# Patient Record
Sex: Female | Born: 1940 | Race: White | Hispanic: No | Marital: Married | Smoking: Never smoker
Health system: Southern US, Community
[De-identification: ages and names within clinical notes are randomized; demographics above are authoritative.]

## PROBLEM LIST (undated history)

## (undated) DIAGNOSIS — N3020 Other chronic cystitis without hematuria: Secondary | ICD-10-CM

## (undated) DIAGNOSIS — D649 Anemia, unspecified: Secondary | ICD-10-CM

## (undated) DIAGNOSIS — K219 Gastro-esophageal reflux disease without esophagitis: Secondary | ICD-10-CM

## (undated) DIAGNOSIS — I509 Heart failure, unspecified: Secondary | ICD-10-CM

## (undated) DIAGNOSIS — J449 Chronic obstructive pulmonary disease, unspecified: Secondary | ICD-10-CM

## (undated) DIAGNOSIS — I10 Essential (primary) hypertension: Secondary | ICD-10-CM

## (undated) DIAGNOSIS — M1990 Unspecified osteoarthritis, unspecified site: Secondary | ICD-10-CM

## (undated) DIAGNOSIS — C801 Malignant (primary) neoplasm, unspecified: Secondary | ICD-10-CM

## (undated) DIAGNOSIS — G4730 Sleep apnea, unspecified: Secondary | ICD-10-CM

## (undated) DIAGNOSIS — Z5189 Encounter for other specified aftercare: Secondary | ICD-10-CM

## (undated) DIAGNOSIS — E785 Hyperlipidemia, unspecified: Secondary | ICD-10-CM

## (undated) DIAGNOSIS — E079 Disorder of thyroid, unspecified: Secondary | ICD-10-CM

## (undated) DIAGNOSIS — IMO0001 Reserved for inherently not codable concepts without codable children: Secondary | ICD-10-CM

## (undated) DIAGNOSIS — I059 Rheumatic mitral valve disease, unspecified: Secondary | ICD-10-CM

## (undated) DIAGNOSIS — I209 Angina pectoris, unspecified: Secondary | ICD-10-CM

## (undated) HISTORY — DX: Hyperlipidemia, unspecified: E78.5

## (undated) HISTORY — DX: Anemia, unspecified: D64.9

## (undated) HISTORY — PX: HERNIA REPAIR: SHX51

## (undated) HISTORY — PX: BLADDER SURGERY: SHX569

## (undated) HISTORY — PX: APPENDECTOMY: SHX54

## (undated) HISTORY — PX: BACK SURGERY: SHX140

## (undated) HISTORY — PX: COLON SURGERY: SHX602

## (undated) HISTORY — DX: Malignant (primary) neoplasm, unspecified: C80.1

## (undated) HISTORY — DX: Reserved for inherently not codable concepts without codable children: IMO0001

## (undated) HISTORY — PX: ABDOMINAL HYSTERECTOMY: SHX81

## (undated) HISTORY — PX: MASTECTOMY: SHX3

## (undated) HISTORY — DX: Encounter for other specified aftercare: Z51.89

## (undated) HISTORY — DX: Unspecified osteoarthritis, unspecified site: M19.90

## (undated) HISTORY — PX: PARATHYROIDECTOMY: SHX19

## (undated) HISTORY — PX: OOPHORECTOMY: SHX86

## (undated) HISTORY — PX: KNEE SURGERY: SHX244

## (undated) HISTORY — DX: Essential (primary) hypertension: I10

## (undated) HISTORY — PX: KIDNEY STONE SURGERY: SHX686

## (undated) HISTORY — PX: FOOT SURGERY: SHX648

---

## 1999-10-25 ENCOUNTER — Ambulatory Visit (HOSPITAL_COMMUNITY): Admission: RE | Admit: 1999-10-25 | Discharge: 1999-10-25 | Payer: Self-pay | Admitting: Neurosurgery

## 1999-10-25 ENCOUNTER — Encounter: Payer: Self-pay | Admitting: Neurosurgery

## 1999-11-01 ENCOUNTER — Ambulatory Visit: Admission: RE | Admit: 1999-11-01 | Discharge: 1999-11-01 | Payer: Self-pay | Admitting: Anesthesiology

## 2000-06-22 ENCOUNTER — Ambulatory Visit (HOSPITAL_COMMUNITY): Admission: RE | Admit: 2000-06-22 | Discharge: 2000-06-22 | Payer: Self-pay | Admitting: Internal Medicine

## 2000-06-22 ENCOUNTER — Encounter: Payer: Self-pay | Admitting: Internal Medicine

## 2003-06-22 ENCOUNTER — Other Ambulatory Visit: Payer: Self-pay

## 2004-03-09 ENCOUNTER — Other Ambulatory Visit: Payer: Self-pay

## 2004-03-10 ENCOUNTER — Inpatient Hospital Stay: Payer: Self-pay | Admitting: Anesthesiology

## 2004-03-10 ENCOUNTER — Other Ambulatory Visit: Payer: Self-pay

## 2004-05-15 ENCOUNTER — Ambulatory Visit: Payer: Self-pay | Admitting: Cardiology

## 2004-05-30 ENCOUNTER — Ambulatory Visit: Payer: Self-pay | Admitting: Nurse Practitioner

## 2004-06-20 ENCOUNTER — Ambulatory Visit: Payer: Self-pay | Admitting: Internal Medicine

## 2004-06-27 ENCOUNTER — Ambulatory Visit: Payer: Self-pay

## 2004-10-07 ENCOUNTER — Ambulatory Visit: Payer: Self-pay

## 2004-10-24 ENCOUNTER — Ambulatory Visit: Payer: Self-pay | Admitting: Internal Medicine

## 2005-01-21 ENCOUNTER — Emergency Department: Payer: Self-pay | Admitting: Unknown Physician Specialty

## 2005-01-21 ENCOUNTER — Other Ambulatory Visit: Payer: Self-pay

## 2005-05-04 ENCOUNTER — Emergency Department: Payer: Self-pay | Admitting: Emergency Medicine

## 2005-05-09 ENCOUNTER — Inpatient Hospital Stay: Payer: Self-pay | Admitting: Urology

## 2005-07-21 ENCOUNTER — Ambulatory Visit: Payer: Self-pay | Admitting: Urology

## 2005-08-11 ENCOUNTER — Ambulatory Visit: Payer: Self-pay | Admitting: Pain Medicine

## 2005-08-19 ENCOUNTER — Ambulatory Visit: Payer: Self-pay | Admitting: Pain Medicine

## 2005-08-21 ENCOUNTER — Ambulatory Visit: Payer: Self-pay | Admitting: Pain Medicine

## 2005-09-02 ENCOUNTER — Ambulatory Visit: Payer: Self-pay | Admitting: Physician Assistant

## 2005-09-10 ENCOUNTER — Ambulatory Visit: Payer: Self-pay | Admitting: Physician Assistant

## 2005-09-18 ENCOUNTER — Ambulatory Visit: Payer: Self-pay | Admitting: Pain Medicine

## 2005-09-23 ENCOUNTER — Other Ambulatory Visit: Payer: Self-pay

## 2005-09-23 ENCOUNTER — Inpatient Hospital Stay: Payer: Self-pay | Admitting: Internal Medicine

## 2005-10-07 ENCOUNTER — Ambulatory Visit: Payer: Self-pay | Admitting: Pain Medicine

## 2005-10-21 ENCOUNTER — Ambulatory Visit: Payer: Self-pay | Admitting: Physician Assistant

## 2005-10-31 ENCOUNTER — Inpatient Hospital Stay: Payer: Self-pay | Admitting: Internal Medicine

## 2005-10-31 ENCOUNTER — Other Ambulatory Visit: Payer: Self-pay

## 2005-11-06 ENCOUNTER — Ambulatory Visit: Payer: Self-pay | Admitting: Physician Assistant

## 2005-11-13 ENCOUNTER — Ambulatory Visit: Payer: Self-pay | Admitting: Physician Assistant

## 2005-11-18 ENCOUNTER — Ambulatory Visit: Payer: Self-pay | Admitting: Gastroenterology

## 2005-11-26 ENCOUNTER — Ambulatory Visit: Payer: Self-pay | Admitting: Gastroenterology

## 2005-12-10 ENCOUNTER — Ambulatory Visit: Payer: Self-pay | Admitting: Physician Assistant

## 2006-01-01 ENCOUNTER — Ambulatory Visit: Payer: Self-pay | Admitting: Surgery

## 2006-01-23 ENCOUNTER — Other Ambulatory Visit: Payer: Self-pay

## 2006-01-23 ENCOUNTER — Inpatient Hospital Stay: Payer: Self-pay | Admitting: Internal Medicine

## 2006-02-04 ENCOUNTER — Ambulatory Visit: Payer: Self-pay | Admitting: Internal Medicine

## 2006-02-16 ENCOUNTER — Ambulatory Visit: Payer: Self-pay | Admitting: Urology

## 2006-02-27 ENCOUNTER — Ambulatory Visit: Payer: Self-pay | Admitting: Internal Medicine

## 2006-03-10 ENCOUNTER — Ambulatory Visit: Payer: Self-pay | Admitting: Internal Medicine

## 2006-08-05 ENCOUNTER — Ambulatory Visit: Payer: Self-pay | Admitting: Internal Medicine

## 2006-08-26 ENCOUNTER — Ambulatory Visit: Payer: Self-pay | Admitting: Urology

## 2006-08-27 ENCOUNTER — Ambulatory Visit: Payer: Self-pay | Admitting: Surgery

## 2006-09-07 ENCOUNTER — Ambulatory Visit: Payer: Self-pay | Admitting: Surgery

## 2006-10-02 ENCOUNTER — Ambulatory Visit: Payer: Self-pay | Admitting: Gastroenterology

## 2007-02-11 ENCOUNTER — Ambulatory Visit: Payer: Self-pay | Admitting: Family Medicine

## 2007-02-17 ENCOUNTER — Ambulatory Visit: Payer: Self-pay | Admitting: Internal Medicine

## 2007-02-24 ENCOUNTER — Ambulatory Visit: Payer: Self-pay | Admitting: Urology

## 2007-03-22 ENCOUNTER — Ambulatory Visit: Payer: Self-pay | Admitting: Pulmonary Disease

## 2007-03-27 DIAGNOSIS — IMO0002 Reserved for concepts with insufficient information to code with codable children: Secondary | ICD-10-CM | POA: Insufficient documentation

## 2007-03-27 DIAGNOSIS — J209 Acute bronchitis, unspecified: Secondary | ICD-10-CM | POA: Insufficient documentation

## 2007-03-27 DIAGNOSIS — J309 Allergic rhinitis, unspecified: Secondary | ICD-10-CM | POA: Insufficient documentation

## 2007-03-27 DIAGNOSIS — K219 Gastro-esophageal reflux disease without esophagitis: Secondary | ICD-10-CM

## 2007-04-09 ENCOUNTER — Ambulatory Visit: Payer: Self-pay | Admitting: Pulmonary Disease

## 2007-04-09 ENCOUNTER — Ambulatory Visit: Payer: Self-pay | Admitting: Internal Medicine

## 2007-04-09 LAB — CONVERTED CEMR LAB
Chloride: 106 meq/L (ref 96–112)
Eosinophils Absolute: 0.2 10*3/uL (ref 0.0–0.6)
Eosinophils Relative: 3.1 % (ref 0.0–5.0)
GFR calc Af Amer: 64 mL/min
GFR calc non Af Amer: 53 mL/min
Glucose, Bld: 98 mg/dL (ref 70–99)
HCT: 40 % (ref 36.0–46.0)
Hemoglobin: 14.1 g/dL (ref 12.0–15.0)
Lymphocytes Relative: 30.1 % (ref 12.0–46.0)
MCV: 92.3 fL (ref 78.0–100.0)
Neutro Abs: 3 10*3/uL (ref 1.4–7.7)
Neutrophils Relative %: 51.3 % (ref 43.0–77.0)
Sodium: 140 meq/L (ref 135–145)
WBC: 6 10*3/uL (ref 4.5–10.5)

## 2007-04-16 ENCOUNTER — Ambulatory Visit: Payer: Self-pay | Admitting: Internal Medicine

## 2007-04-22 ENCOUNTER — Ambulatory Visit: Payer: Self-pay | Admitting: Internal Medicine

## 2007-04-23 ENCOUNTER — Ambulatory Visit: Payer: Self-pay | Admitting: Internal Medicine

## 2007-04-28 ENCOUNTER — Ambulatory Visit: Payer: Self-pay | Admitting: Internal Medicine

## 2007-05-17 ENCOUNTER — Ambulatory Visit: Payer: Self-pay | Admitting: Internal Medicine

## 2007-07-06 ENCOUNTER — Ambulatory Visit: Payer: Self-pay | Admitting: Family

## 2007-07-07 ENCOUNTER — Encounter: Payer: Self-pay | Admitting: Internal Medicine

## 2007-09-08 ENCOUNTER — Encounter (INDEPENDENT_AMBULATORY_CARE_PROVIDER_SITE_OTHER): Payer: Self-pay | Admitting: Diagnostic Radiology

## 2007-09-08 ENCOUNTER — Encounter: Admission: RE | Admit: 2007-09-08 | Discharge: 2007-09-08 | Payer: Self-pay | Admitting: Surgery

## 2007-09-15 ENCOUNTER — Ambulatory Visit: Payer: Self-pay | Admitting: Gastroenterology

## 2007-09-17 ENCOUNTER — Encounter: Admission: RE | Admit: 2007-09-17 | Discharge: 2007-09-17 | Payer: Self-pay | Admitting: Surgery

## 2007-09-29 ENCOUNTER — Ambulatory Visit: Payer: Self-pay | Admitting: Gastroenterology

## 2007-10-22 ENCOUNTER — Inpatient Hospital Stay (HOSPITAL_COMMUNITY): Admission: RE | Admit: 2007-10-22 | Discharge: 2007-10-24 | Payer: Self-pay | Admitting: Surgery

## 2007-10-22 ENCOUNTER — Encounter (INDEPENDENT_AMBULATORY_CARE_PROVIDER_SITE_OTHER): Payer: Self-pay | Admitting: Surgery

## 2007-11-04 ENCOUNTER — Ambulatory Visit: Payer: Self-pay | Admitting: Oncology

## 2007-11-16 LAB — COMPREHENSIVE METABOLIC PANEL
Albumin: 3.6 g/dL (ref 3.5–5.2)
BUN: 12 mg/dL (ref 6–23)
Calcium: 9.4 mg/dL (ref 8.4–10.5)
Chloride: 106 mEq/L (ref 96–112)
Glucose, Bld: 140 mg/dL — ABNORMAL HIGH (ref 70–99)
Potassium: 4.2 mEq/L (ref 3.5–5.3)

## 2007-11-16 LAB — CBC WITH DIFFERENTIAL (CANCER CENTER ONLY)
BASO%: 0.8 % (ref 0.0–2.0)
LYMPH%: 31.1 % (ref 14.0–48.0)
MCH: 24.2 pg — ABNORMAL LOW (ref 26.0–34.0)
MCHC: 32.9 g/dL (ref 32.0–36.0)
MCV: 74 fL — ABNORMAL LOW (ref 81–101)
MONO%: 7.2 % (ref 0.0–13.0)
NEUT%: 55.9 % (ref 39.6–80.0)
Platelets: 293 10*3/uL (ref 145–400)
RDW: 17.5 % — ABNORMAL HIGH (ref 10.5–14.6)

## 2007-11-24 ENCOUNTER — Ambulatory Visit: Payer: Self-pay | Admitting: Urology

## 2007-12-14 ENCOUNTER — Encounter: Admission: RE | Admit: 2007-12-14 | Discharge: 2007-12-14 | Payer: Self-pay | Admitting: Oncology

## 2007-12-21 ENCOUNTER — Ambulatory Visit: Payer: Self-pay | Admitting: Oncology

## 2007-12-23 LAB — CBC WITH DIFFERENTIAL (CANCER CENTER ONLY)
BASO%: 0.6 % (ref 0.0–2.0)
EOS%: 4.3 % (ref 0.0–7.0)
LYMPH#: 1.8 10*3/uL (ref 0.9–3.3)
LYMPH%: 28.4 % (ref 14.0–48.0)
MCHC: 32.8 g/dL (ref 32.0–36.0)
MONO#: 0.5 10*3/uL (ref 0.1–0.9)
Platelets: 315 10*3/uL (ref 145–400)
RDW: 22.9 % — ABNORMAL HIGH (ref 10.5–14.6)
WBC: 6.2 10*3/uL (ref 3.9–10.0)

## 2007-12-23 LAB — CMP (CANCER CENTER ONLY)
ALT(SGPT): 151 U/L — ABNORMAL HIGH (ref 10–47)
AST: 226 U/L — ABNORMAL HIGH (ref 11–38)
Albumin: 3.6 g/dL (ref 3.3–5.5)
BUN, Bld: 18 mg/dL (ref 7–22)
Calcium: 9.6 mg/dL (ref 8.0–10.3)
Chloride: 101 mEq/L (ref 98–108)
Potassium: 4.8 mEq/L — ABNORMAL HIGH (ref 3.3–4.7)
Total Protein: 7.8 g/dL (ref 6.4–8.1)

## 2007-12-24 LAB — IRON AND TIBC: UIBC: 331 ug/dL

## 2008-01-10 LAB — HEPATIC FUNCTION PANEL
Albumin: 3.7 g/dL (ref 3.5–5.2)
Alkaline Phosphatase: 136 U/L — ABNORMAL HIGH (ref 39–117)
Total Bilirubin: 0.8 mg/dL (ref 0.3–1.2)

## 2008-03-14 ENCOUNTER — Ambulatory Visit: Payer: Self-pay | Admitting: Oncology

## 2008-03-16 LAB — CBC WITH DIFFERENTIAL (CANCER CENTER ONLY)
BASO#: 0.1 10*3/uL (ref 0.0–0.2)
Eosinophils Absolute: 0.2 10*3/uL (ref 0.0–0.5)
HCT: 39.8 % (ref 34.8–46.6)
LYMPH%: 38.9 % (ref 14.0–48.0)
MCH: 31.5 pg (ref 26.0–34.0)
MCV: 92 fL (ref 81–101)
MONO%: 7.6 % (ref 0.0–13.0)
NEUT%: 47.6 % (ref 39.6–80.0)
Platelets: 217 10*3/uL (ref 145–400)
RBC: 4.3 10*6/uL (ref 3.70–5.32)

## 2008-03-16 LAB — GAMMA GT: GGT: 145 U/L — ABNORMAL HIGH (ref 7–51)

## 2008-03-16 LAB — CMP (CANCER CENTER ONLY)
CO2: 28 mEq/L (ref 18–33)
Creat: 0.8 mg/dl (ref 0.6–1.2)
Glucose, Bld: 121 mg/dL — ABNORMAL HIGH (ref 73–118)
Sodium: 142 mEq/L (ref 128–145)
Total Bilirubin: 0.8 mg/dl (ref 0.20–1.60)
Total Protein: 7.6 g/dL (ref 6.4–8.1)

## 2008-03-23 ENCOUNTER — Inpatient Hospital Stay: Payer: Self-pay | Admitting: Internal Medicine

## 2008-06-21 ENCOUNTER — Ambulatory Visit: Payer: Self-pay | Admitting: Oncology

## 2008-06-22 LAB — CBC WITH DIFFERENTIAL/PLATELET
Basophils Absolute: 0.1 10*3/uL (ref 0.0–0.1)
EOS%: 6.4 % (ref 0.0–7.0)
HCT: 40.5 % (ref 34.8–46.6)
HGB: 14 g/dL (ref 11.6–15.9)
MCH: 31.3 pg (ref 26.0–34.0)
MCV: 90.4 fL (ref 81.0–101.0)
MONO%: 5.9 % (ref 0.0–13.0)
NEUT%: 49.2 % (ref 39.6–76.8)

## 2008-06-22 LAB — CMP (CANCER CENTER ONLY)
ALT(SGPT): 26 U/L (ref 10–47)
AST: 43 U/L — ABNORMAL HIGH (ref 11–38)
BUN, Bld: 16 mg/dL (ref 7–22)
Calcium: 9.4 mg/dL (ref 8.0–10.3)
Chloride: 102 mEq/L (ref 98–108)
Creat: 1.2 mg/dl (ref 0.6–1.2)
Total Bilirubin: 0.5 mg/dl (ref 0.20–1.60)

## 2008-07-17 ENCOUNTER — Ambulatory Visit: Payer: Self-pay | Admitting: Family

## 2008-09-11 ENCOUNTER — Encounter: Admission: RE | Admit: 2008-09-11 | Discharge: 2008-09-11 | Payer: Self-pay | Admitting: Oncology

## 2008-09-13 ENCOUNTER — Ambulatory Visit: Payer: Self-pay | Admitting: Family Medicine

## 2008-10-02 ENCOUNTER — Ambulatory Visit: Payer: Self-pay | Admitting: Oncology

## 2008-10-04 LAB — CMP (CANCER CENTER ONLY)
ALT(SGPT): 24 U/L (ref 10–47)
AST: 30 U/L (ref 11–38)
Albumin: 3.6 g/dL (ref 3.3–5.5)
Alkaline Phosphatase: 81 U/L (ref 26–84)
Calcium: 9.5 mg/dL (ref 8.0–10.3)
Chloride: 105 mEq/L (ref 98–108)
Potassium: 4.1 mEq/L (ref 3.3–4.7)
Sodium: 143 mEq/L (ref 128–145)
Total Protein: 7.3 g/dL (ref 6.4–8.1)

## 2008-10-04 LAB — CBC WITH DIFFERENTIAL (CANCER CENTER ONLY)
BASO#: 0.1 10*3/uL (ref 0.0–0.2)
Eosinophils Absolute: 0.4 10*3/uL (ref 0.0–0.5)
HGB: 13.5 g/dL (ref 11.6–15.9)
MCH: 32.8 pg (ref 26.0–34.0)
MCV: 93 fL (ref 81–101)
MONO%: 8.4 % (ref 0.0–13.0)
NEUT#: 2.4 10*3/uL (ref 1.5–6.5)
RBC: 4.11 10*6/uL (ref 3.70–5.32)

## 2008-10-06 ENCOUNTER — Ambulatory Visit: Payer: Self-pay | Admitting: Family Medicine

## 2008-10-31 ENCOUNTER — Ambulatory Visit: Payer: Self-pay | Admitting: Surgery

## 2009-02-23 ENCOUNTER — Ambulatory Visit: Payer: Self-pay | Admitting: Urology

## 2009-02-27 ENCOUNTER — Ambulatory Visit: Payer: Self-pay | Admitting: Urology

## 2009-03-08 ENCOUNTER — Ambulatory Visit: Payer: Self-pay | Admitting: Specialist

## 2009-04-08 IMAGING — CR DG ABDOMEN 3V
1 series · 3 of 3 positions shown · non-contrast
Comparison: none

REASON FOR EXAM: Abdominal distention, vomiting
COMMENTS:

[Series 1: view not recorded · 0.17mm/px · 3 of 3 slices shown]
[im 1/3]
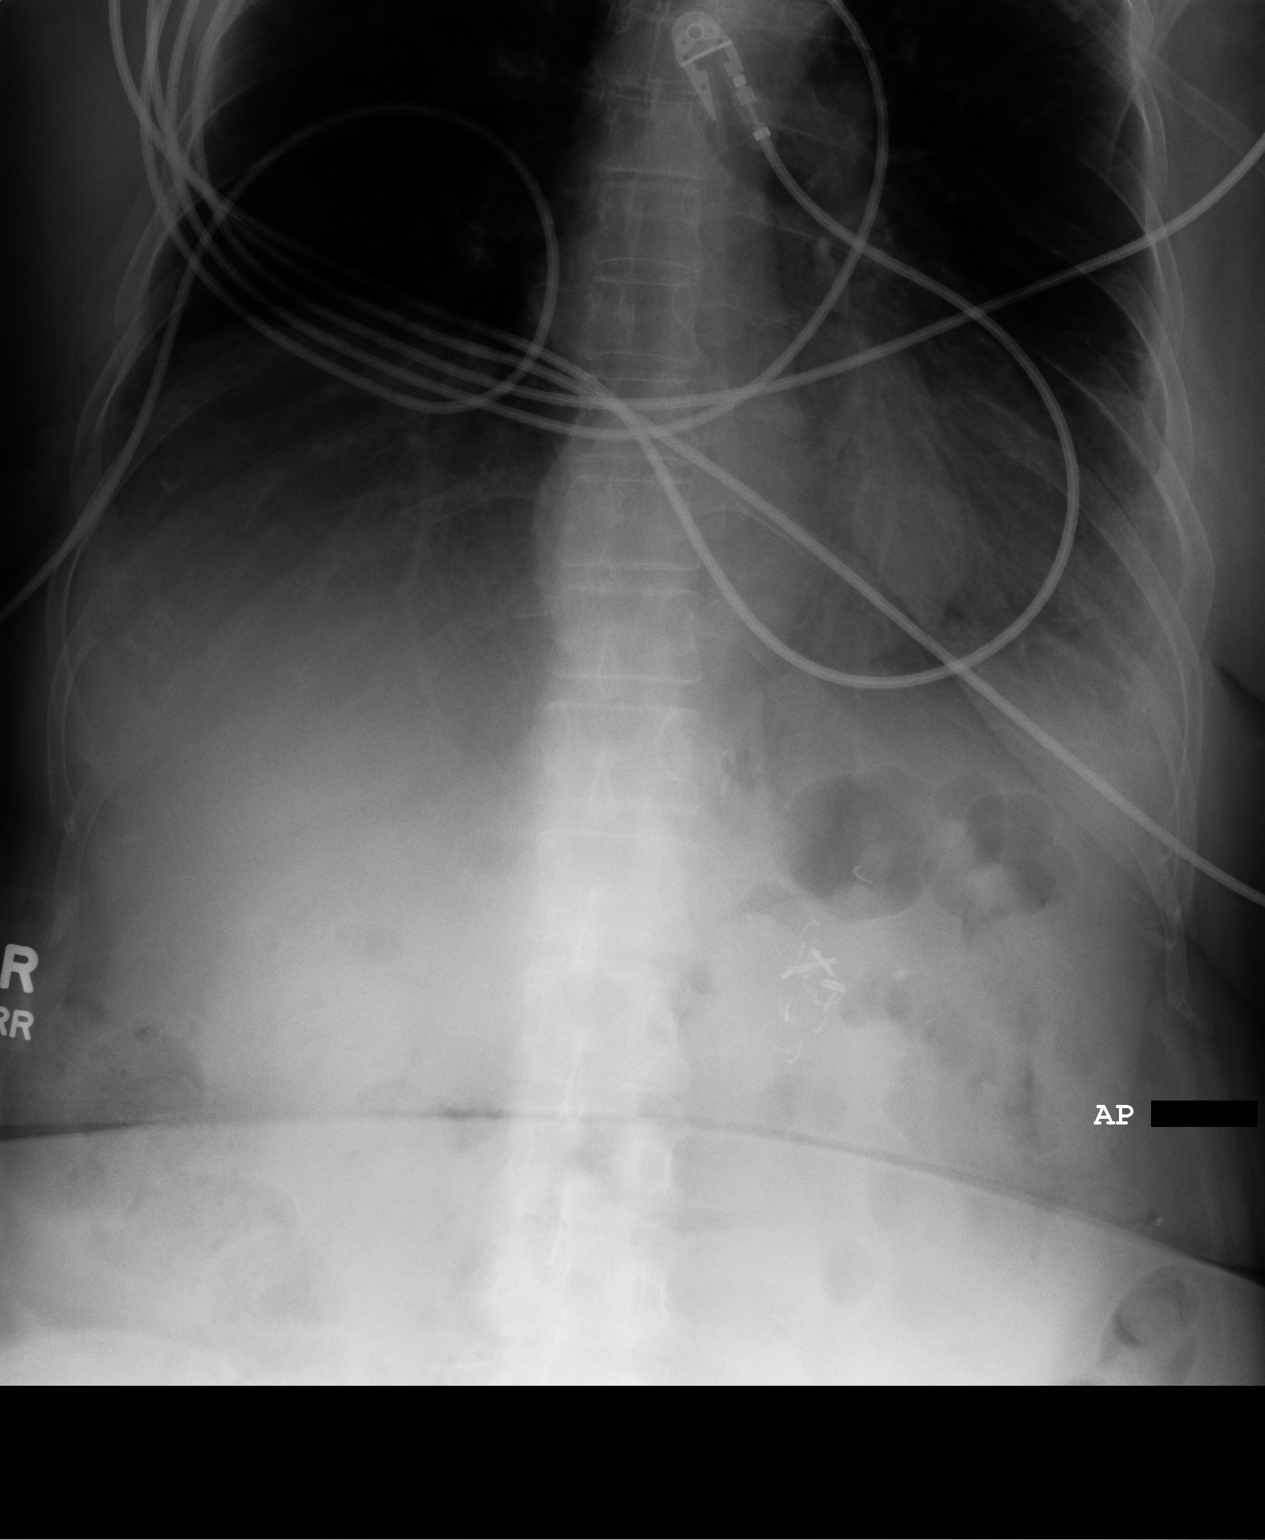
[im 2/3]
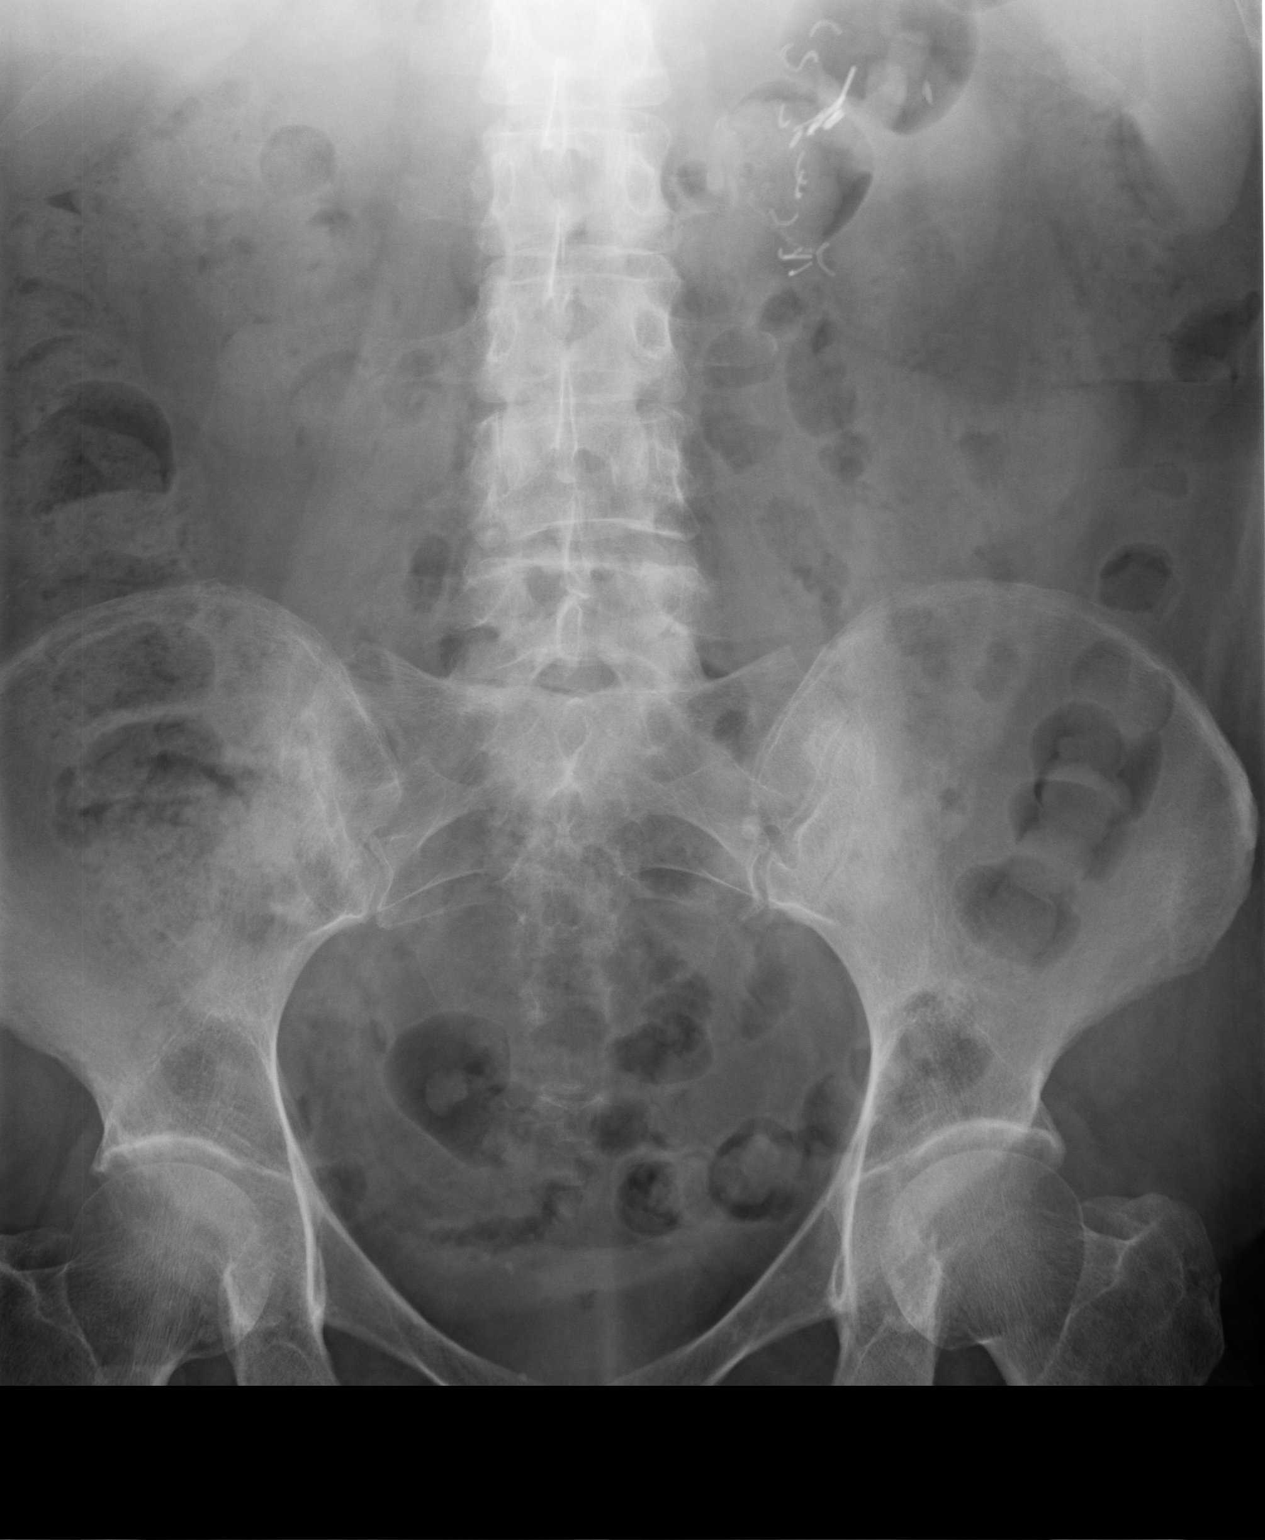
[im 3/3]
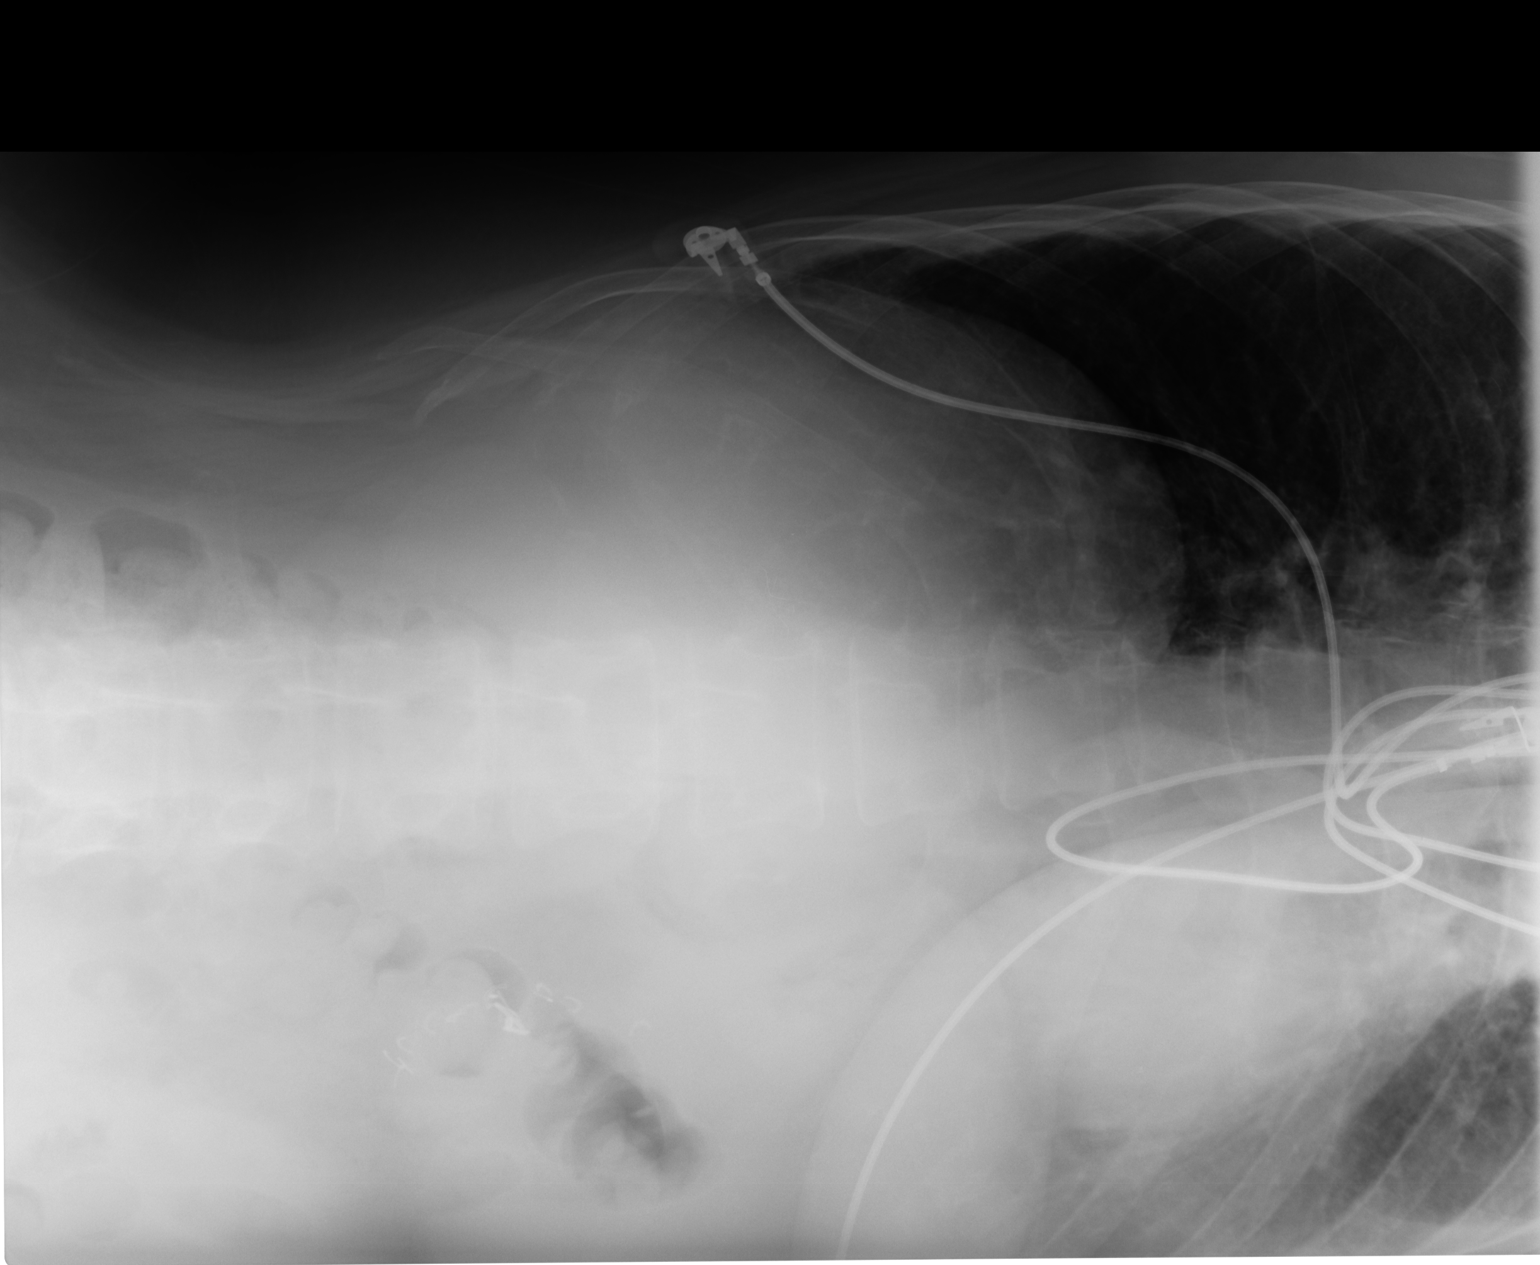

[3 of 3 positions shown; findings below may reference images not displayed]

PROCEDURE:     DXR - DXR ABDOMEN COMPLETE  - March 24, 2008  [DATE]

RESULT:     No subdiaphragmatic free air is seen. The bowel gas pattern
shows no specific abnormalities. No bowel obstruction is identified. No
definite air/fluid levels are seen. Gas is visualized in both the large and
small bowel. There is a moderate amount of fecal material in the ascending
and transverse colon. Post operative metallic clips are seen in the LEFT
upper quadrant. No abnormal intra-abdominal calcifications are noted. No
acute bony abnormalities are seen.
IMPRESSION: No acute changes are identified.

## 2009-04-09 IMAGING — US ABDOMEN ULTRASOUND
1 series · 17 of 25 positions shown · non-contrast
Comparison: none

REASON FOR EXAM: transaminases
COMMENTS:

[Series 1: abdomen ultrasound · 17 of 76 slices shown]
[im 1/76]
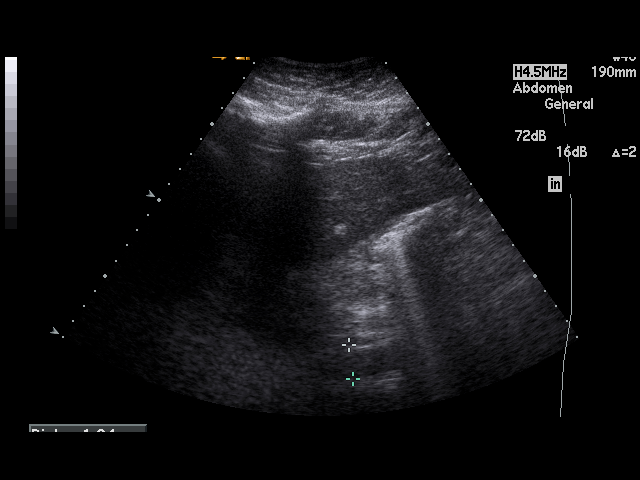
[im 7/76]
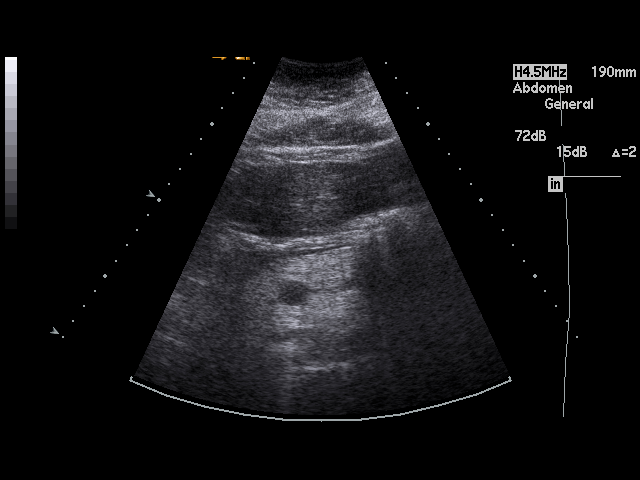
[im 10/76]
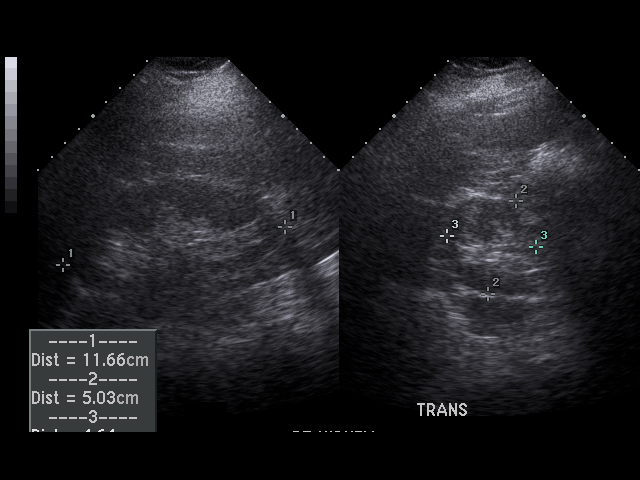
[im 16/76]
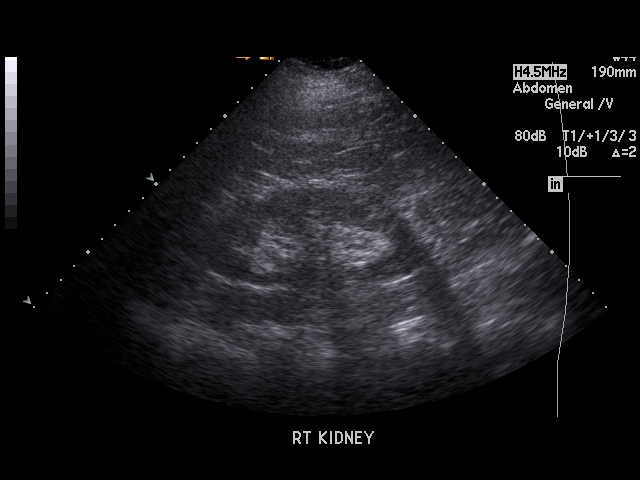
[im 19/76]
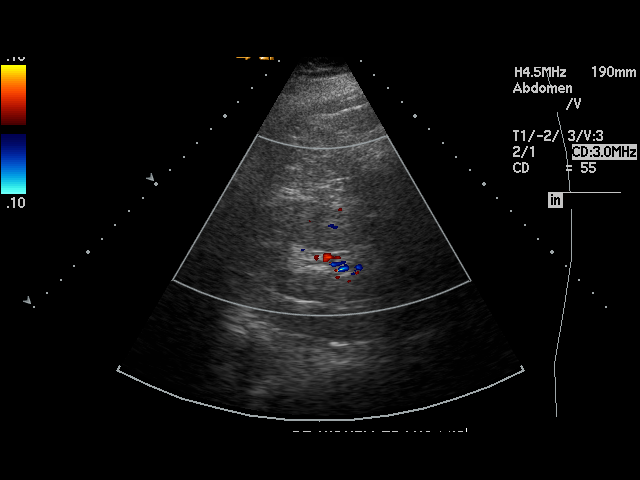
[im 26/76]
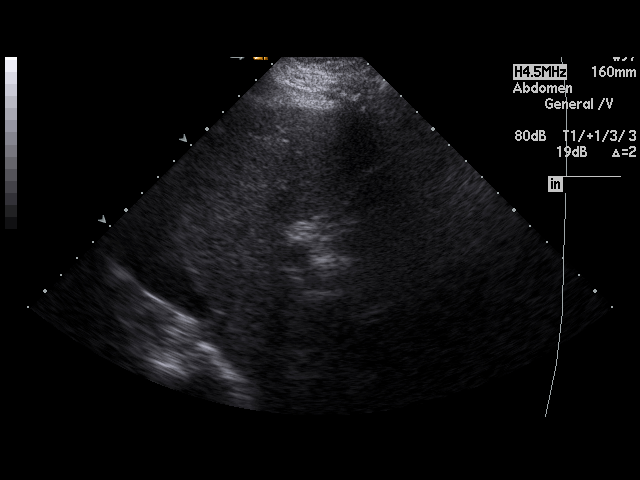
[im 29/76]
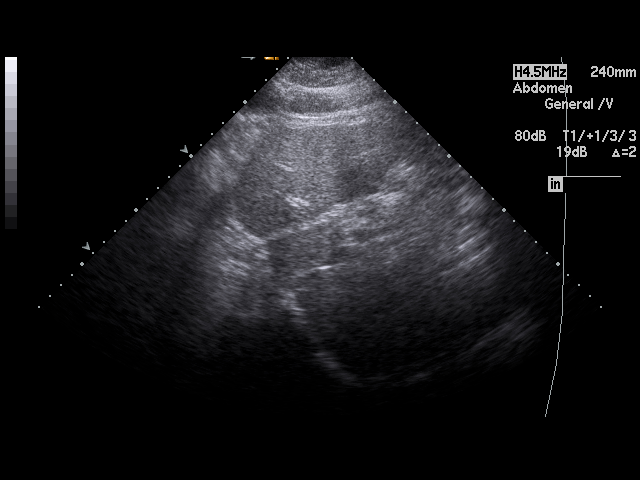
[im 35/76]
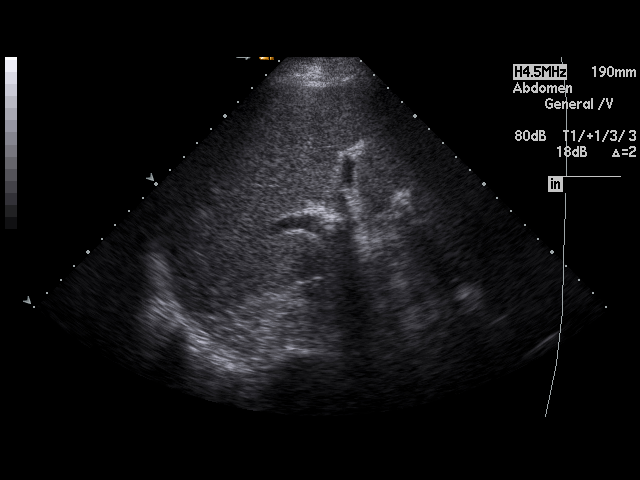
[im 38/76]
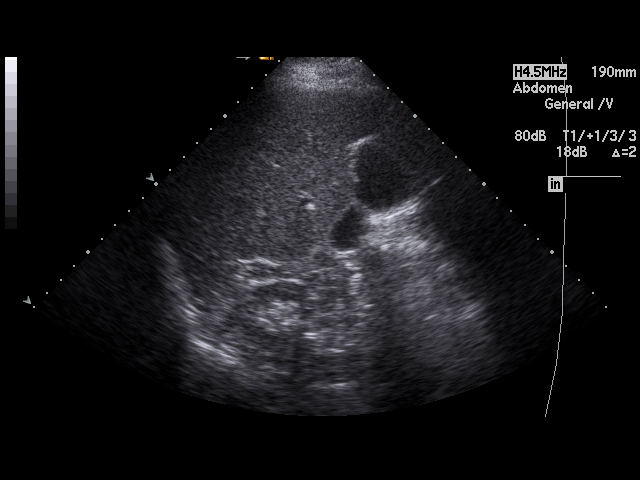
[im 41/76]
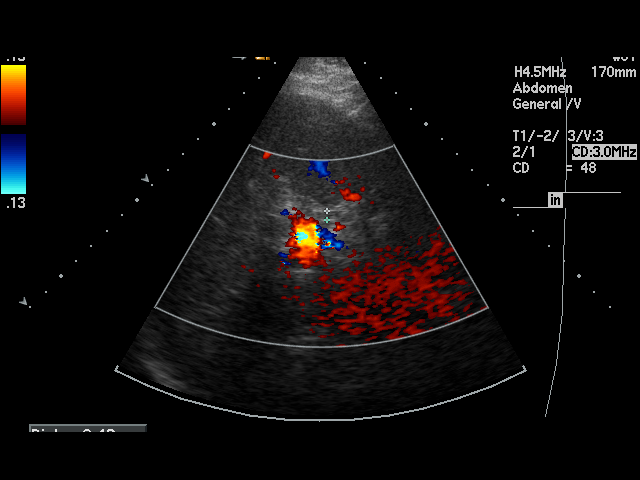
[im 47/76]
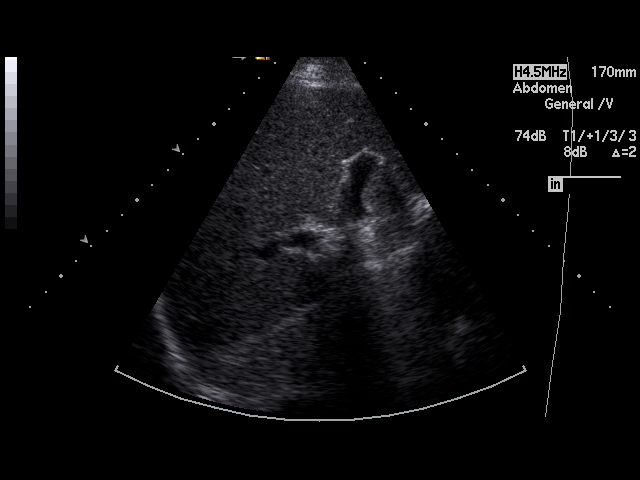
[im 51/76]
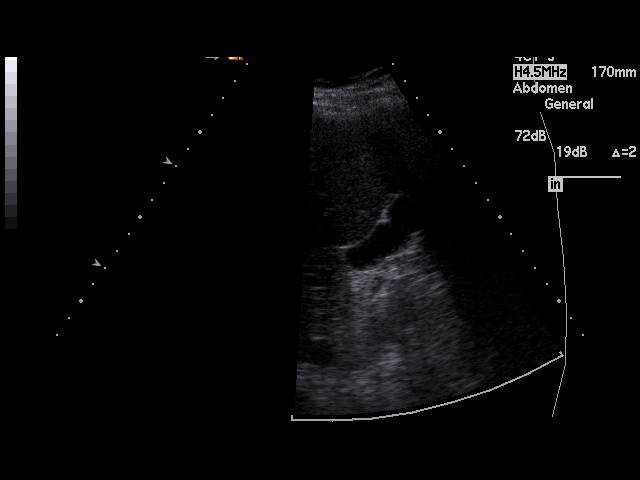
[im 57/76]
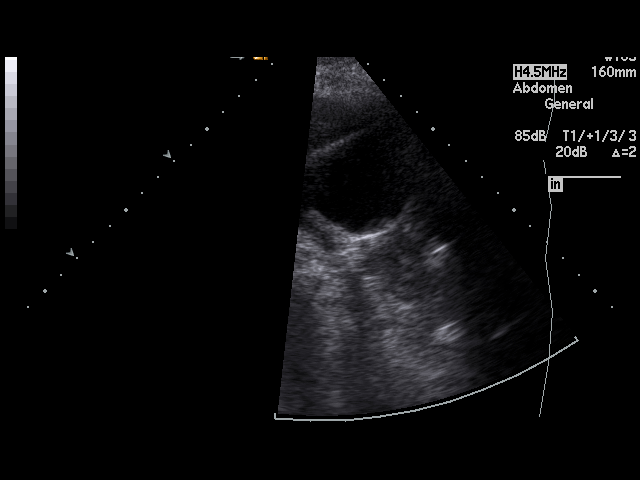
[im 60/76]
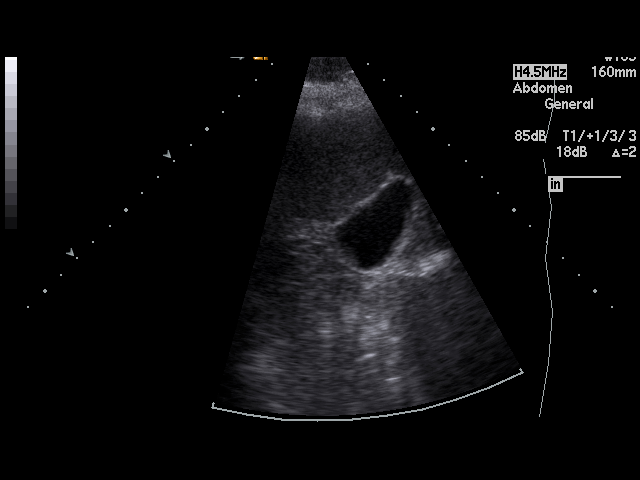
[im 66/76]
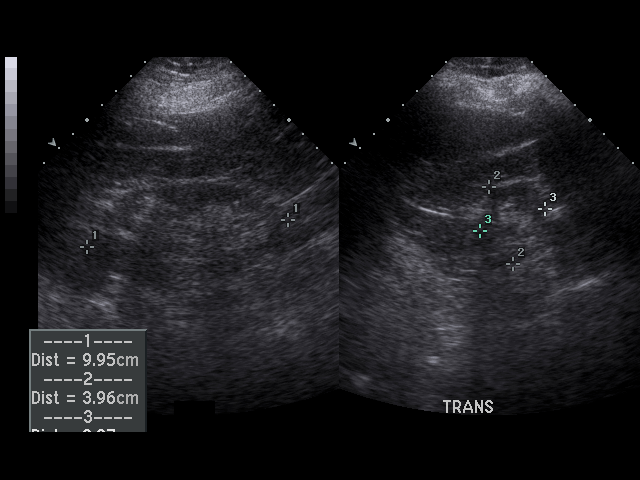
[im 69/76]
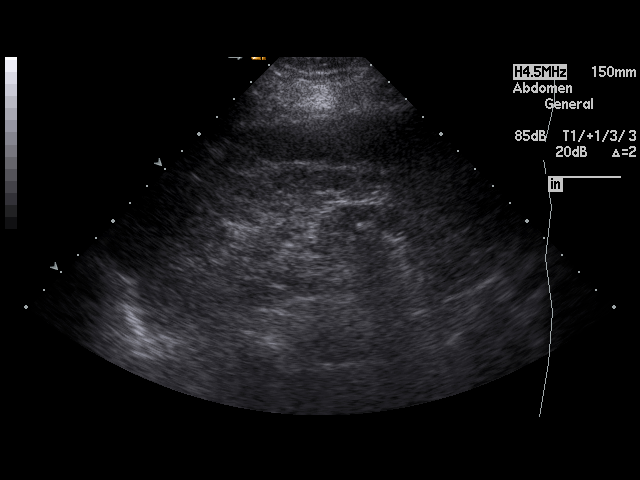
[im 76/76]
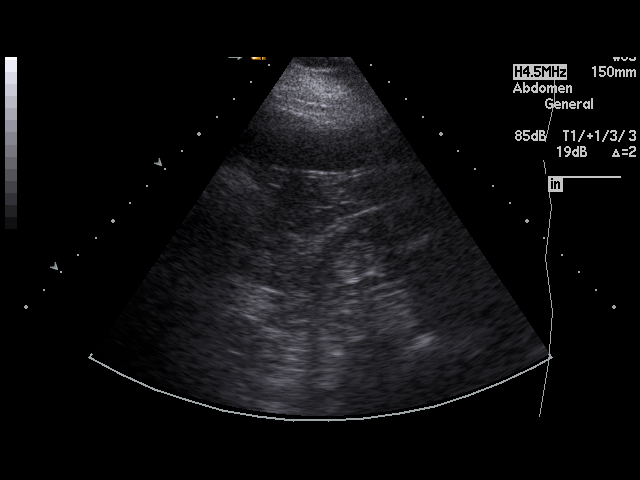

[17 of 25 positions shown; findings below may reference images not displayed]

PROCEDURE:     US  - US ABDOMEN GENERAL SURVEY  - March 25, 2008  [DATE]

RESULT:     Ultrasound of the abdomen is performed. The tail of the pancreas
is obscured by bowel gas. The aorta appears to be normal. The visualized
pancreas is unremarkable. The kidneys show a normal appearance on the RIGHT
with no mass or obstruction. The LEFT kidney demonstrates cortical thinning.
There are some echogenic areas in the LEFT kidney which may represent
stones. Vascular calcification cannot be completely excluded but is felt to
be somewhat less likely. No LEFT renal hydronephrosis is evident.

There is evidence of some sludge in the gallbladder. There is no sonographic
Murphy sign. The common bile duct diameter is normal at 4.2 mm. The spleen
is unremarkable.
IMPRESSION: 1. Sludge within the gallbladder. No biliary ductal dilation.
2. LEFT renal stones appear to be present. The LEFT renal cortex is thinned.
There is no urinary obstruction.

## 2009-04-17 ENCOUNTER — Ambulatory Visit: Payer: Self-pay | Admitting: Oncology

## 2009-04-18 LAB — CMP (CANCER CENTER ONLY)
Albumin: 3.8 g/dL (ref 3.3–5.5)
Alkaline Phosphatase: 84 U/L (ref 26–84)
BUN, Bld: 17 mg/dL (ref 7–22)
CO2: 24 mEq/L (ref 18–33)
Calcium: 9.8 mg/dL (ref 8.0–10.3)
Chloride: 107 mEq/L (ref 98–108)
Glucose, Bld: 82 mg/dL (ref 73–118)
Potassium: 4.5 mEq/L (ref 3.3–4.7)
Sodium: 142 mEq/L (ref 128–145)
Total Protein: 7.6 g/dL (ref 6.4–8.1)

## 2009-04-18 LAB — CBC WITH DIFFERENTIAL (CANCER CENTER ONLY)
BASO#: 0.1 10*3/uL (ref 0.0–0.2)
EOS%: 5.6 % (ref 0.0–7.0)
Eosinophils Absolute: 0.3 10*3/uL (ref 0.0–0.5)
HGB: 14 g/dL (ref 11.6–15.9)
LYMPH#: 2.3 10*3/uL (ref 0.9–3.3)
NEUT#: 2.7 10*3/uL (ref 1.5–6.5)
RBC: 4.27 10*6/uL (ref 3.70–5.32)
WBC: 5.9 10*3/uL (ref 3.9–10.0)

## 2009-06-18 ENCOUNTER — Ambulatory Visit (HOSPITAL_COMMUNITY): Admission: RE | Admit: 2009-06-18 | Discharge: 2009-06-18 | Payer: Self-pay | Admitting: Infectious Disease

## 2009-06-18 ENCOUNTER — Ambulatory Visit: Payer: Self-pay | Admitting: Infectious Disease

## 2009-06-18 ENCOUNTER — Encounter: Payer: Self-pay | Admitting: Infectious Disease

## 2009-06-18 DIAGNOSIS — J4489 Other specified chronic obstructive pulmonary disease: Secondary | ICD-10-CM | POA: Insufficient documentation

## 2009-06-18 DIAGNOSIS — R300 Dysuria: Secondary | ICD-10-CM | POA: Insufficient documentation

## 2009-06-18 DIAGNOSIS — N390 Urinary tract infection, site not specified: Secondary | ICD-10-CM | POA: Insufficient documentation

## 2009-06-18 DIAGNOSIS — J449 Chronic obstructive pulmonary disease, unspecified: Secondary | ICD-10-CM

## 2009-06-18 LAB — CONVERTED CEMR LAB
AST: 27 units/L (ref 0–37)
BUN: 16 mg/dL (ref 6–23)
CO2: 23 meq/L (ref 19–32)
Calcium: 9.9 mg/dL (ref 8.4–10.5)
Casts: NONE SEEN /lpf
Chloride: 105 meq/L (ref 96–112)
Creatinine, Ser: 0.89 mg/dL (ref 0.40–1.20)
Crystals: NONE SEEN
Eosinophils Absolute: 0.2 10*3/uL (ref 0.0–0.7)
Eosinophils Relative: 4 % (ref 0–5)
Glucose, Bld: 96 mg/dL (ref 70–99)
HCT: 49 % — ABNORMAL HIGH (ref 36.0–46.0)
Hemoglobin: 14.2 g/dL (ref 12.0–15.0)
Lymphs Abs: 2.3 10*3/uL (ref 0.7–4.0)
MCV: 109.9 fL — ABNORMAL HIGH (ref >=78.0)
Monocytes Absolute: 0.5 10*3/uL (ref 0.1–1.0)
Nitrite: NEGATIVE
Platelets: 220 10*3/uL (ref 150–400)
Specific Gravity, Urine: 1.019 (ref 1.005–1.0)
Urobilinogen, UA: 0.2 (ref 0.0–1.0)
WBC: 6 10*3/uL (ref 4.0–10.5)
pH: 6.5 (ref 5.0–8.0)

## 2009-06-20 ENCOUNTER — Encounter: Payer: Self-pay | Admitting: Infectious Disease

## 2009-06-25 ENCOUNTER — Encounter: Payer: Self-pay | Admitting: Infectious Disease

## 2009-07-02 ENCOUNTER — Encounter: Payer: Self-pay | Admitting: Infectious Disease

## 2009-07-05 ENCOUNTER — Telehealth: Payer: Self-pay | Admitting: Infectious Disease

## 2009-07-06 ENCOUNTER — Telehealth: Payer: Self-pay | Admitting: Infectious Disease

## 2009-07-11 ENCOUNTER — Encounter: Payer: Self-pay | Admitting: Infectious Disease

## 2009-07-18 ENCOUNTER — Ambulatory Visit: Payer: Self-pay | Admitting: Urology

## 2009-07-18 ENCOUNTER — Ambulatory Visit: Payer: Self-pay | Admitting: Family

## 2009-07-19 ENCOUNTER — Ambulatory Visit: Payer: Self-pay | Admitting: Infectious Disease

## 2009-07-19 DIAGNOSIS — Z1639 Resistance to other specified antimicrobial drug: Secondary | ICD-10-CM

## 2009-07-19 DIAGNOSIS — A498 Other bacterial infections of unspecified site: Secondary | ICD-10-CM | POA: Insufficient documentation

## 2009-07-19 DIAGNOSIS — M542 Cervicalgia: Secondary | ICD-10-CM | POA: Insufficient documentation

## 2009-07-19 LAB — CONVERTED CEMR LAB
BUN: 17 mg/dL (ref 6–23)
Basophils Absolute: 0.1 10*3/uL (ref 0.0–0.1)
Bilirubin Urine: NEGATIVE
CO2: 19 meq/L (ref 19–32)
CRP: 0.4 mg/dL (ref <0.6)
Eosinophils Absolute: 0.5 10*3/uL (ref 0.0–0.7)
Eosinophils Relative: 7 % — ABNORMAL HIGH (ref 0–5)
Glucose, Bld: 90 mg/dL (ref 70–99)
HCT: 42.6 % (ref 36.0–46.0)
Hemoglobin, Urine: NEGATIVE
Hemoglobin: 14.4 g/dL (ref 12.0–15.0)
Ketones, ur: NEGATIVE mg/dL
MCV: 93.4 fL (ref >=78.0)
Monocytes Absolute: 0.6 10*3/uL (ref 0.1–1.0)
Nitrite: NEGATIVE
Platelets: 260 10*3/uL (ref 150–400)
Potassium: 4.5 meq/L (ref 3.5–5.3)
RDW: 12.6 % (ref 11.5–15.5)
Sodium: 141 meq/L (ref 135–145)
Specific Gravity, Urine: 1.018 (ref 1.005–1.0)
pH: 6 (ref 5.0–8.0)

## 2009-07-24 ENCOUNTER — Observation Stay: Payer: Self-pay | Admitting: Internal Medicine

## 2009-07-27 ENCOUNTER — Encounter: Payer: Self-pay | Admitting: Infectious Disease

## 2009-08-01 ENCOUNTER — Telehealth: Payer: Self-pay | Admitting: Infectious Disease

## 2009-08-20 ENCOUNTER — Ambulatory Visit (HOSPITAL_COMMUNITY): Admission: RE | Admit: 2009-08-20 | Discharge: 2009-08-20 | Payer: Self-pay | Admitting: Infectious Disease

## 2009-08-20 ENCOUNTER — Ambulatory Visit: Payer: Self-pay | Admitting: Infectious Disease

## 2009-08-20 DIAGNOSIS — R079 Chest pain, unspecified: Secondary | ICD-10-CM

## 2009-08-20 DIAGNOSIS — M79609 Pain in unspecified limb: Secondary | ICD-10-CM

## 2009-09-04 ENCOUNTER — Telehealth: Payer: Self-pay | Admitting: Infectious Disease

## 2009-10-21 IMAGING — US ABDOMEN ULTRASOUND
1 series · 17 of 25 positions shown · non-contrast
Comparison: none

REASON FOR EXAM: abd pain   EVAL gallbladder
COMMENTS:

[Series 1: abdomen ultrasound · 17 of 77 slices shown]
[im 1/77]
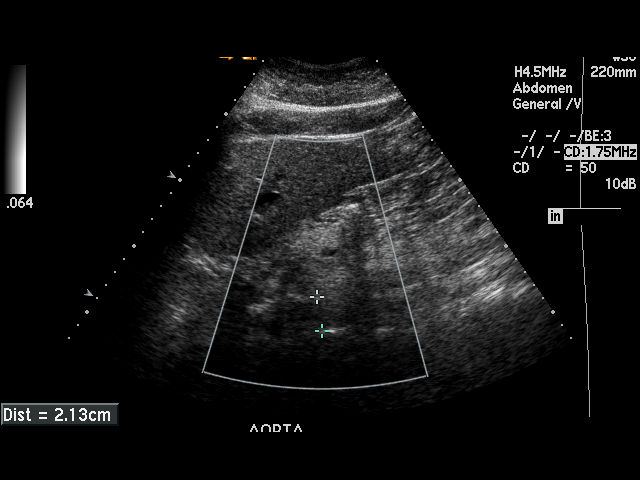
[im 7/77]
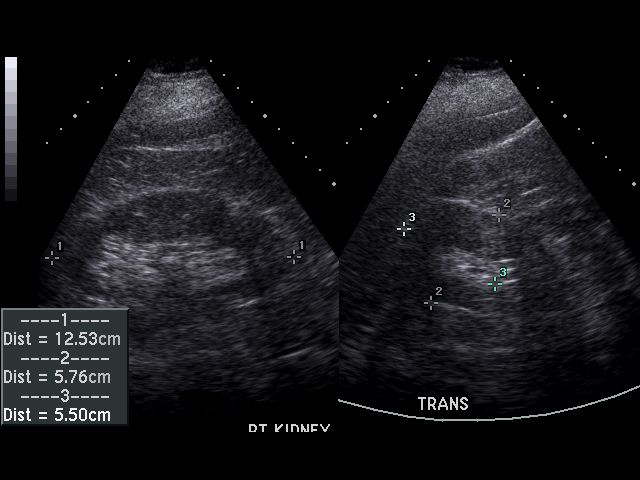
[im 10/77]
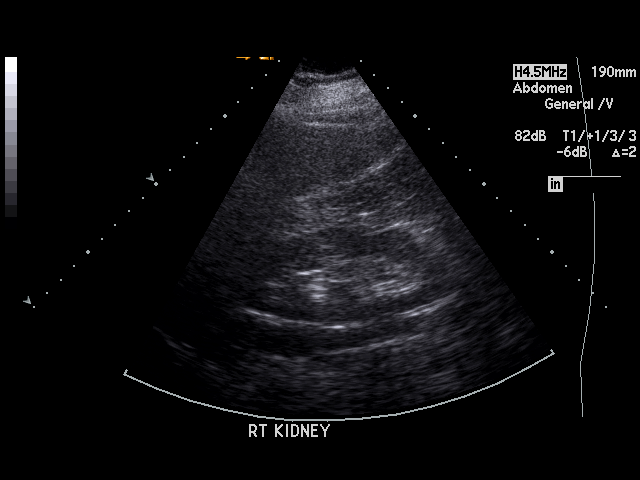
[im 16/77]
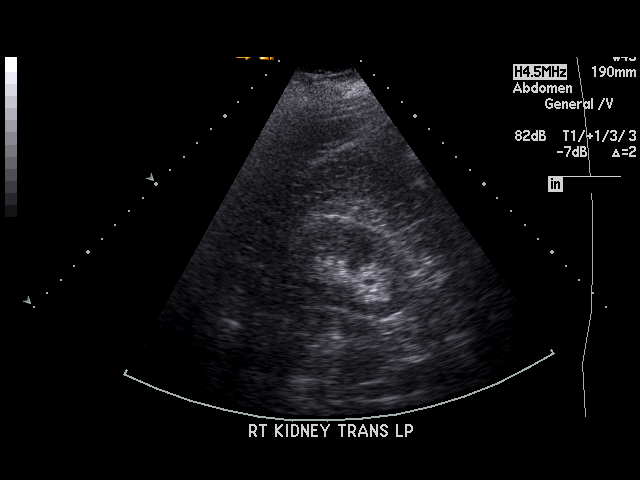
[im 20/77]
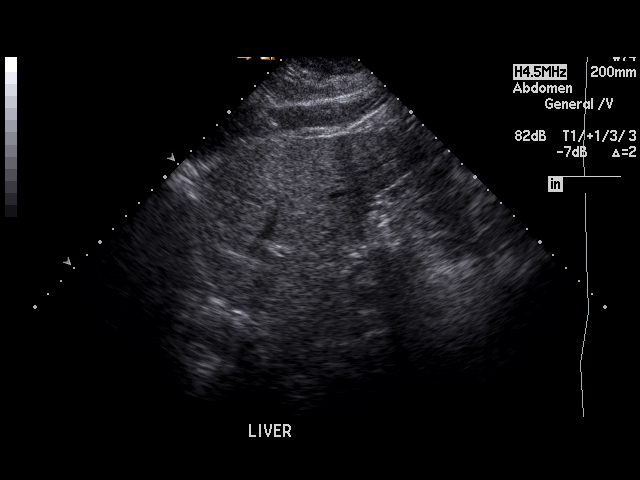
[im 26/77]
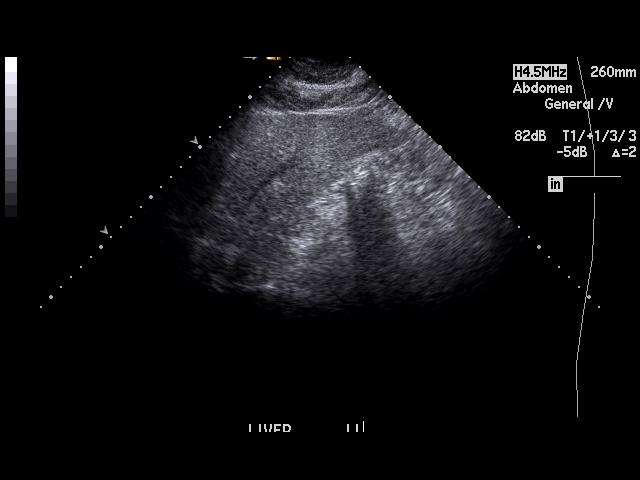
[im 29/77]
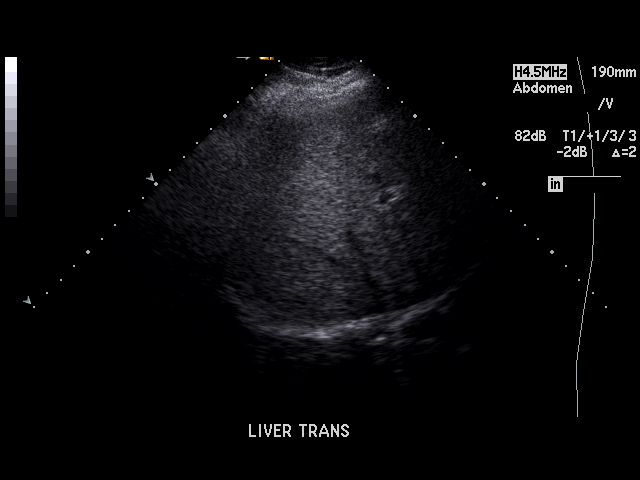
[im 35/77]
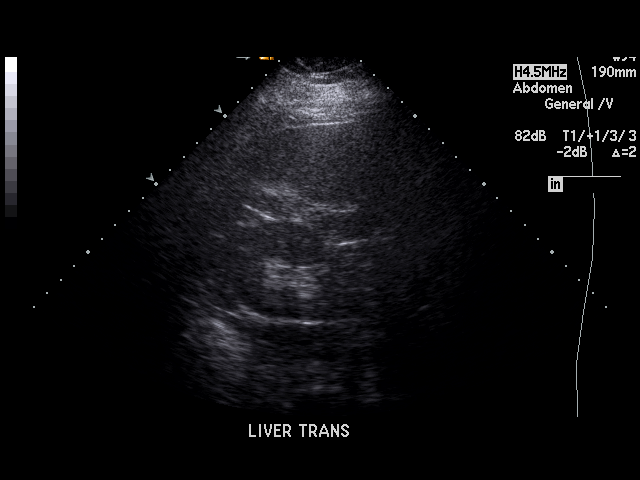
[im 39/77]
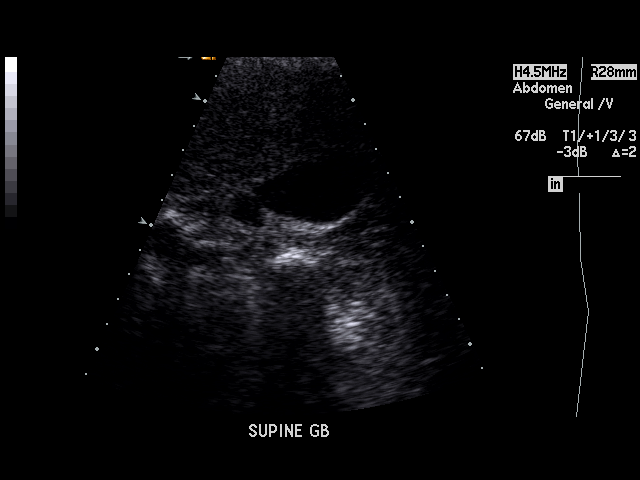
[im 42/77]
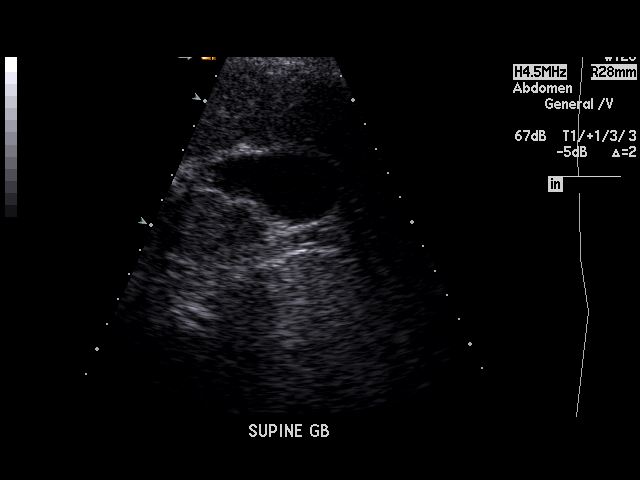
[im 48/77]
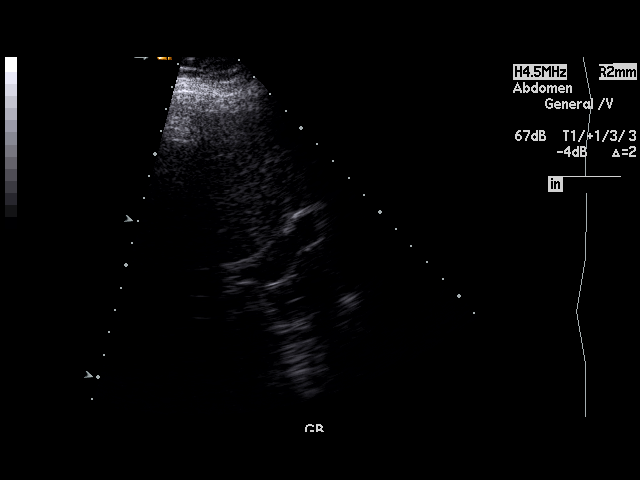
[im 51/77]
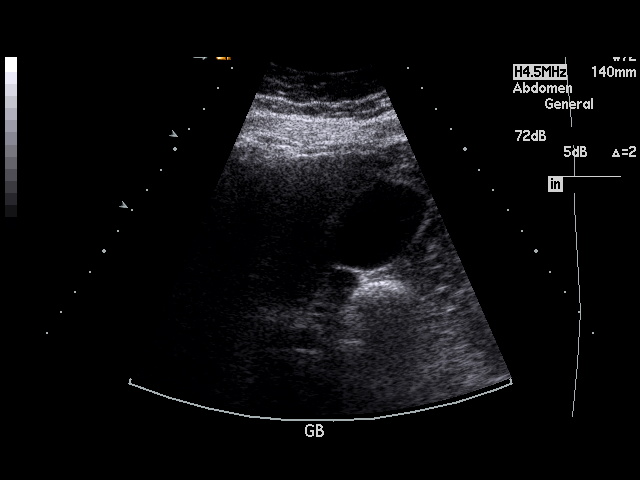
[im 58/77]
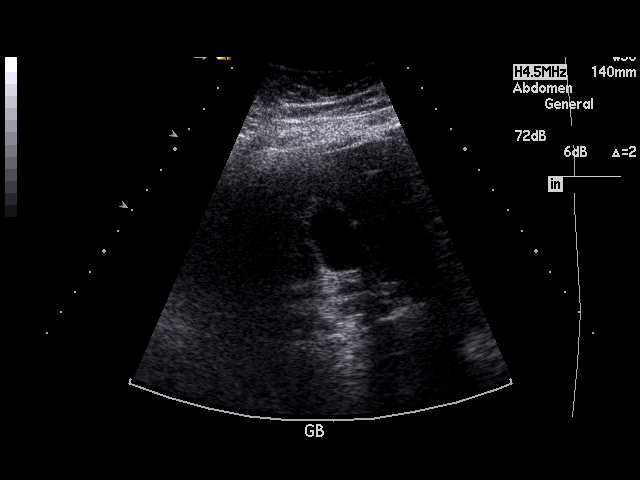
[im 61/77]
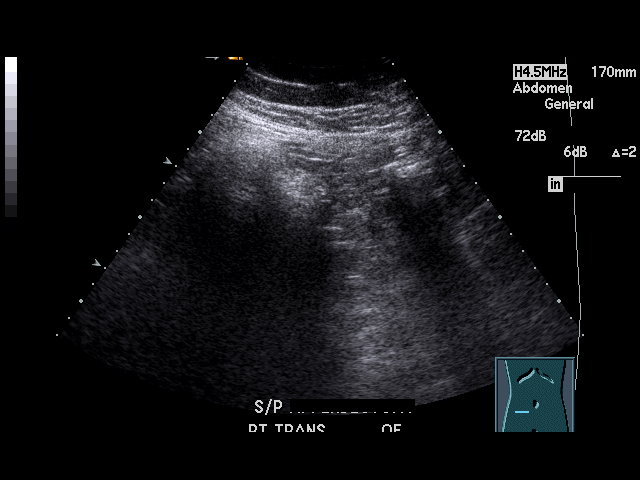
[im 67/77]
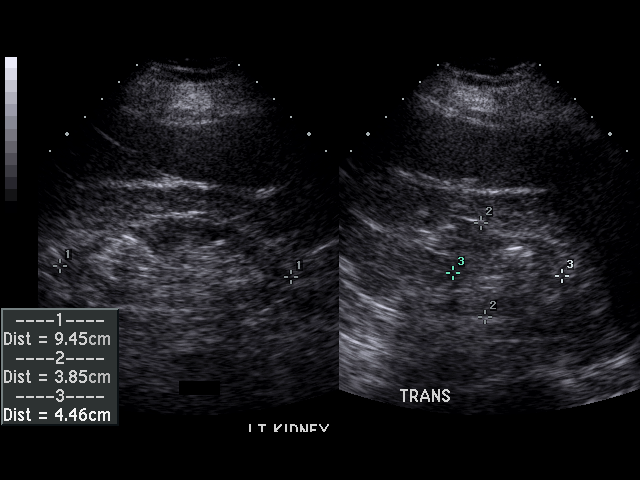
[im 70/77]
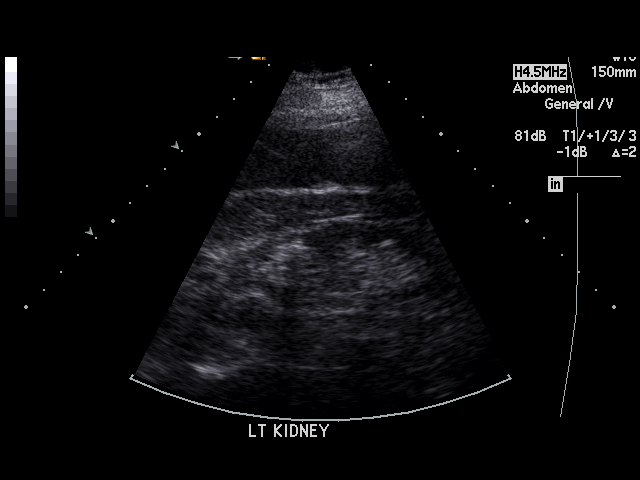
[im 77/77]
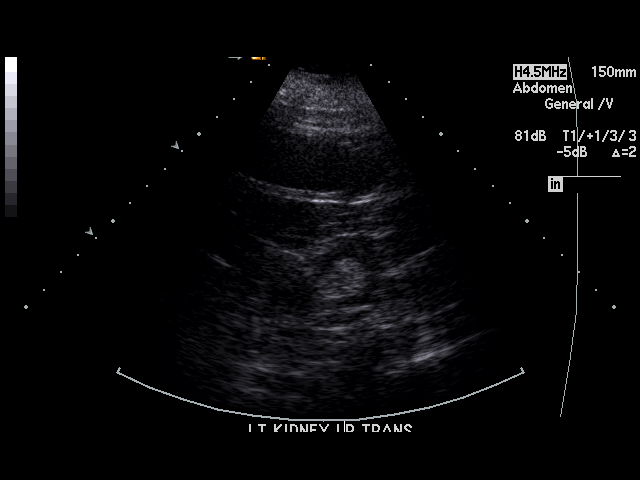

[17 of 25 positions shown; findings below may reference images not displayed]

PROCEDURE:     US  - US ABDOMEN GENERAL SURVEY  - October 06, 2008  [DATE]

RESULT:     The liver demonstrates a homogeneous echotexture. Hepatopetal
flow is identified within the portal vein. The aorta and IVC are
unremarkable. The visualized portions of the pancreas are unremarkable. The
spleen is homogeneous in echotexture and measures 11.97 cm in longitudinal
dimension. Evaluation of the gallbladder demonstrates no evidence of
pericholecystic fluid, gallstones or sludging. Gallbladder wall thickness is
2 mm. The common bile duct measures 3.3 mm in diameter. Evaluation of the
kidneys demonstrates a small echogenic focus within the left kidney
suspicious for a nonobstructing cortical calculus. The left kidney measures
9.45 x 3.85 x 4.46 cm. The right kidney measures 12.53 x 5.76 x 5.5 cm.
There is no evidence of hydronephrosis or masses.
IMPRESSION: Findings suspicious for a nonobstructing left renal
calculus. Otherwise unremarkable Abdominal Ultrasound.

## 2009-10-22 ENCOUNTER — Ambulatory Visit: Payer: Self-pay | Admitting: Infectious Disease

## 2009-10-22 DIAGNOSIS — A779 Spotted fever, unspecified: Secondary | ICD-10-CM

## 2009-10-22 DIAGNOSIS — R5381 Other malaise: Secondary | ICD-10-CM

## 2009-10-22 DIAGNOSIS — R5383 Other fatigue: Secondary | ICD-10-CM

## 2009-10-22 LAB — CONVERTED CEMR LAB
Albumin: 4.4 g/dL (ref 3.5–5.2)
BUN: 14 mg/dL (ref 6–23)
Basophils Absolute: 0 10*3/uL (ref 0.0–0.1)
CO2: 24 meq/L (ref 19–32)
Calcium: 9.1 mg/dL (ref 8.4–10.5)
Chloride: 105 meq/L (ref 96–112)
Creatinine, Ser: 0.86 mg/dL (ref 0.40–1.20)
Eosinophils Relative: 5 % (ref 0–5)
Glucose, Bld: 98 mg/dL (ref 70–99)
HCT: 40.9 % (ref 36.0–46.0)
Hemoglobin: 13.5 g/dL (ref 12.0–15.0)
Lymphocytes Relative: 39 % (ref 12–46)
Lymphs Abs: 1.9 10*3/uL (ref 0.7–4.0)
Monocytes Absolute: 0.4 10*3/uL (ref 0.1–1.0)
Monocytes Relative: 7 % (ref 3–12)
Neutro Abs: 2.4 10*3/uL (ref 1.7–7.7)
Potassium: 4.6 meq/L (ref 3.5–5.3)
RBC: 4.24 M/uL (ref 3.87–5.11)
RDW: 12.8 % (ref 11.5–15.5)
WBC: 5 10*3/uL (ref 4.0–10.5)

## 2009-11-15 IMAGING — RF DG SMALL BOWEL
1 series · 6 of 6 positions shown · non-contrast
Comparison: none

REASON FOR EXAM: abd pain
COMMENTS:

[Series 1: run · 6 of 6 slices shown]
[im 1/6]
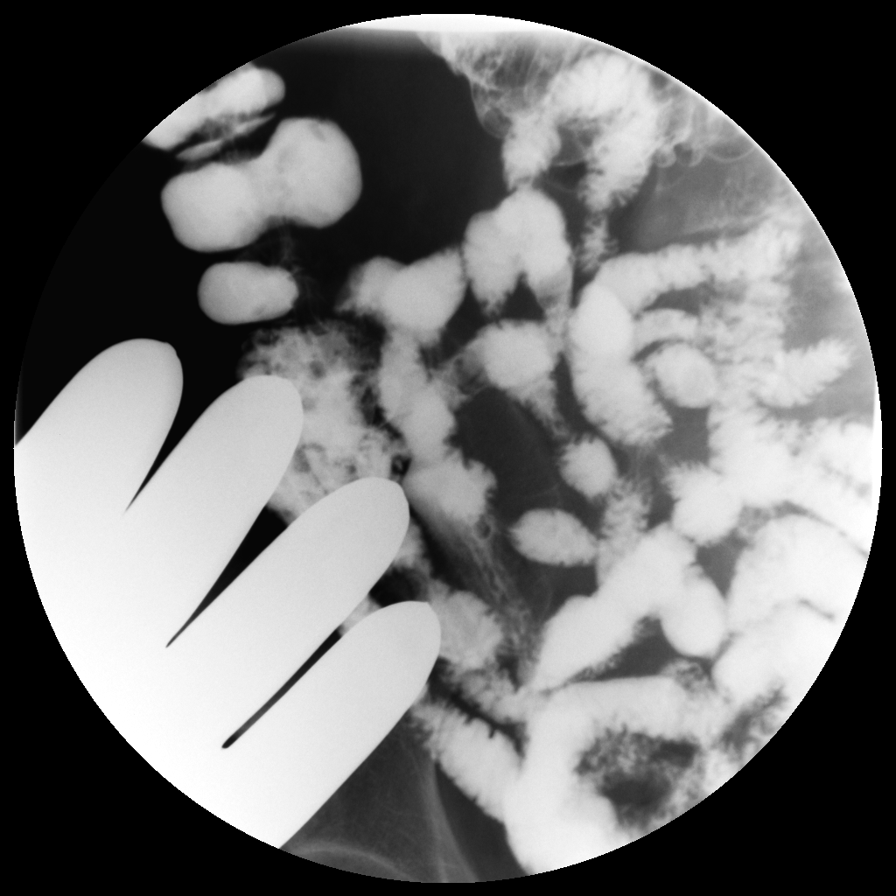
[im 2/6]
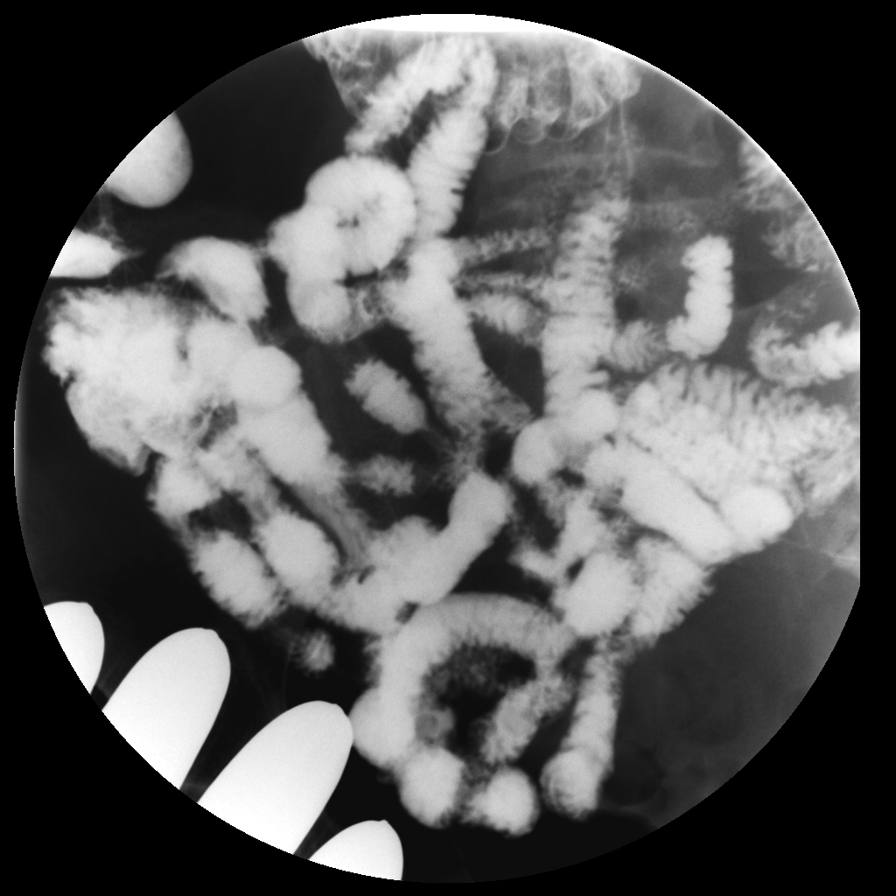
[im 3/6]
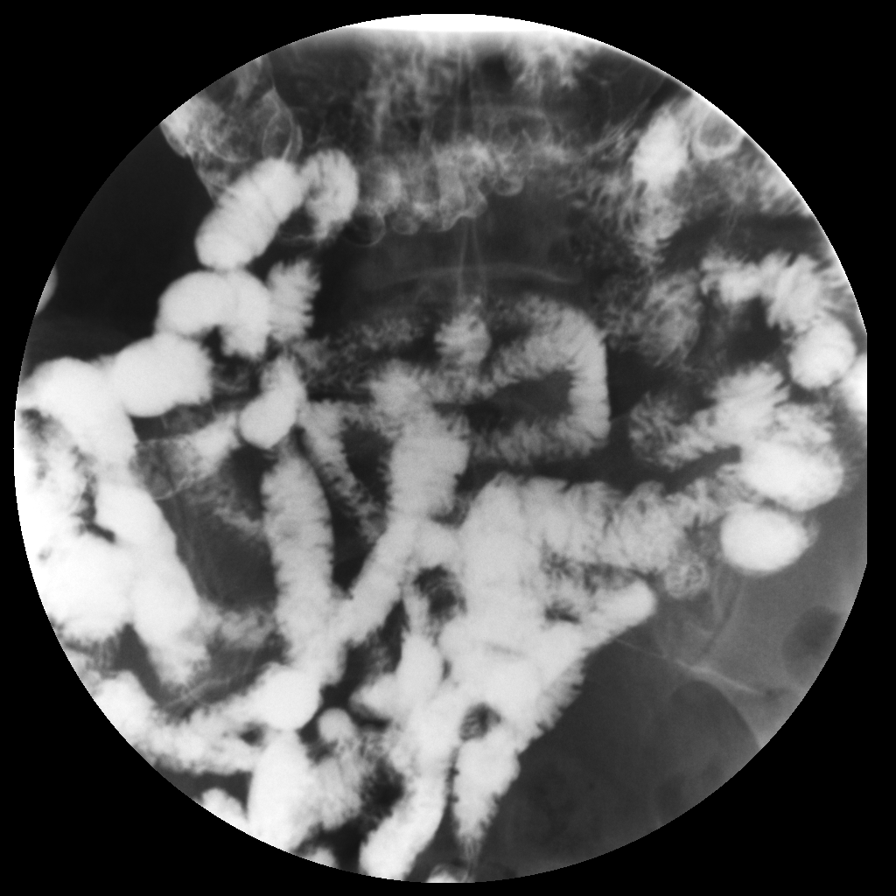
[im 4/6]
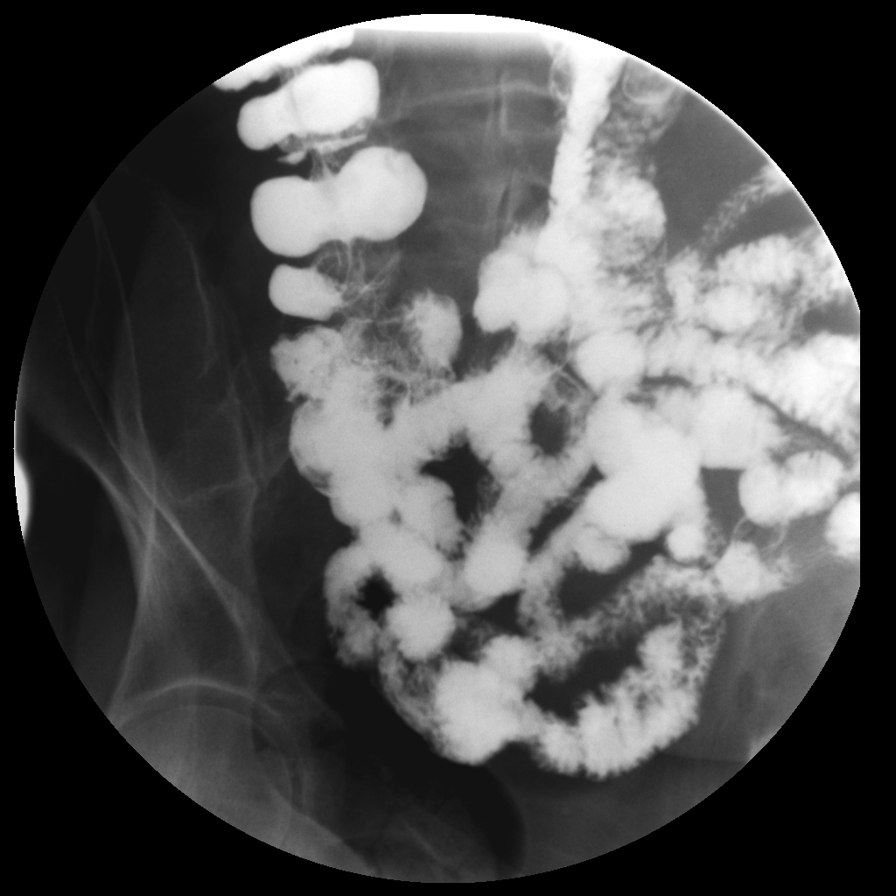
[im 5/6]
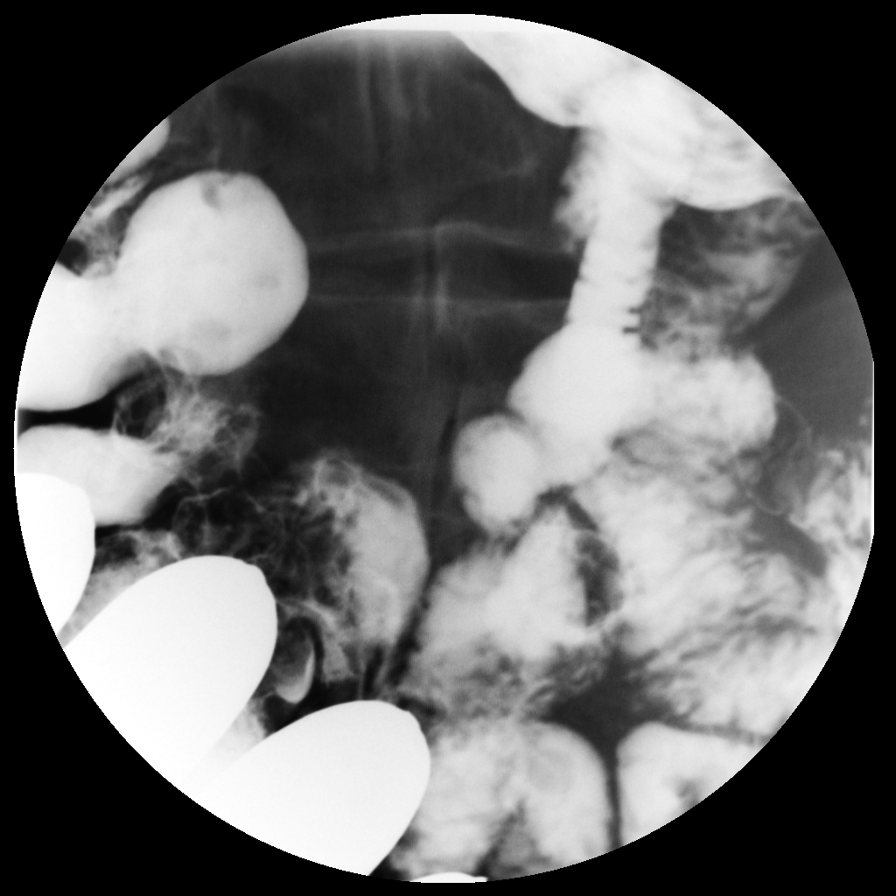
[im 6/6]
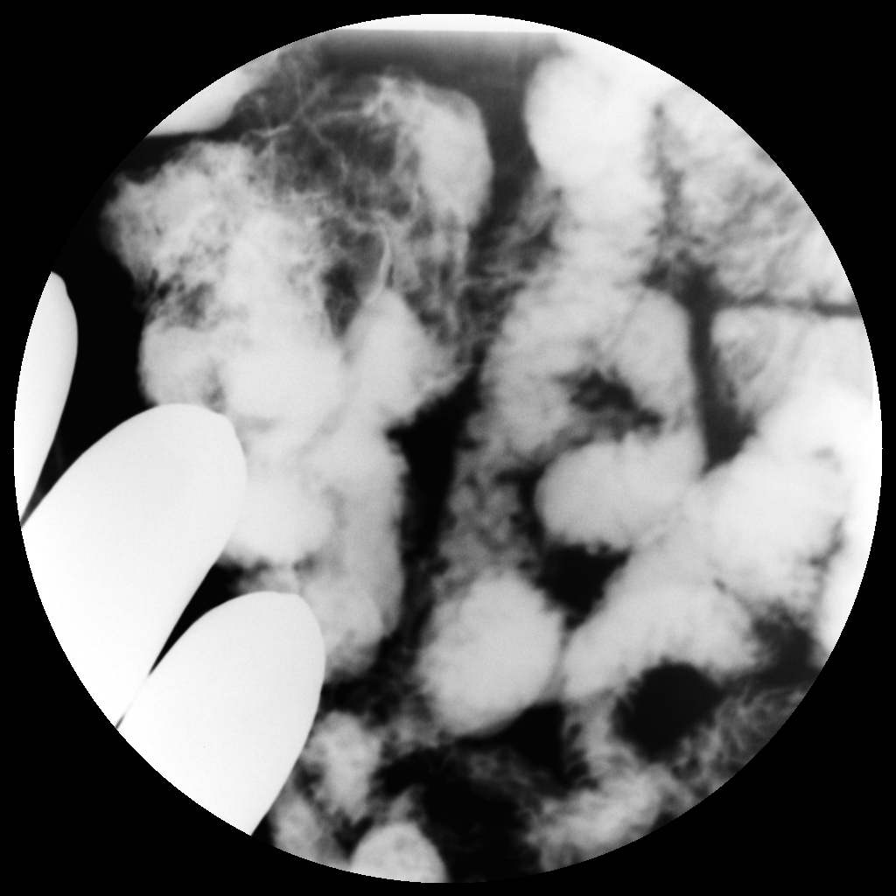

[6 of 6 positions shown; findings below may reference images not displayed]

PROCEDURE:     FL  - FL SMALL BOWEL  - October 31, 2008 [DATE]

RESULT:     Small bowel follow through examination is performed. The barium
column reaches the colon by 30 minutes. Spot films show no abnormal small
bowel mucosa or evidence of obstruction. The terminal ileum is normal in
appearance. There appears to be normal small bowel peristalsis.
IMPRESSION: Normal appearing small bowel follow through.

## 2009-12-13 ENCOUNTER — Ambulatory Visit: Payer: Self-pay | Admitting: Oncology

## 2009-12-19 LAB — CBC WITH DIFFERENTIAL (CANCER CENTER ONLY)
BASO%: 0.7 % (ref 0.0–2.0)
Eosinophils Absolute: 0.3 10*3/uL (ref 0.0–0.5)
HCT: 39.8 % (ref 34.8–46.6)
LYMPH%: 44.8 % (ref 14.0–48.0)
MCV: 95 fL (ref 81–101)
MONO#: 0.4 10*3/uL (ref 0.1–0.9)
NEUT%: 40.7 % (ref 39.6–80.0)
RDW: 10.8 % (ref 10.5–14.6)
WBC: 4.9 10*3/uL (ref 3.9–10.0)

## 2009-12-19 LAB — CMP (CANCER CENTER ONLY)
BUN, Bld: 21 mg/dL (ref 7–22)
CO2: 27 mEq/L (ref 18–33)
Creat: 1.3 mg/dl — ABNORMAL HIGH (ref 0.6–1.2)
Glucose, Bld: 95 mg/dL (ref 73–118)
Total Bilirubin: 0.7 mg/dl (ref 0.20–1.60)

## 2010-01-22 ENCOUNTER — Telehealth: Payer: Self-pay | Admitting: Infectious Disease

## 2010-01-23 ENCOUNTER — Telehealth: Payer: Self-pay | Admitting: Infectious Disease

## 2010-01-29 ENCOUNTER — Telehealth: Payer: Self-pay | Admitting: Infectious Disease

## 2010-01-30 ENCOUNTER — Ambulatory Visit: Payer: Self-pay | Admitting: Infectious Disease

## 2010-01-30 LAB — CONVERTED CEMR LAB
BUN: 21 mg/dL (ref 6–23)
Chloride: 107 meq/L (ref 96–112)
Creatinine, Ser: 1.29 mg/dL — ABNORMAL HIGH (ref 0.40–1.20)
Eosinophils Absolute: 0.3 10*3/uL (ref 0.0–0.7)
Glucose, Bld: 116 mg/dL — ABNORMAL HIGH (ref 70–99)
Hemoglobin, Urine: NEGATIVE
Hemoglobin: 13.3 g/dL (ref 12.0–15.0)
Lymphs Abs: 3.2 10*3/uL (ref 0.7–4.0)
MCV: 94.5 fL (ref 78.0–100.0)
Monocytes Absolute: 0.6 10*3/uL (ref 0.1–1.0)
Monocytes Relative: 9 % (ref 3–12)
Neutrophils Relative %: 42 % — ABNORMAL LOW (ref 43–77)
Potassium: 4.5 meq/L (ref 3.5–5.3)
Protein, ur: 30 mg/dL — AB
RBC / HPF: NONE SEEN (ref <3)
RBC: 4.2 M/uL (ref 3.87–5.11)
Urine Glucose: NEGATIVE mg/dL
WBC: 7.4 10*3/uL (ref 4.0–10.5)

## 2010-01-31 ENCOUNTER — Encounter: Payer: Self-pay | Admitting: Infectious Disease

## 2010-02-01 ENCOUNTER — Inpatient Hospital Stay (HOSPITAL_COMMUNITY): Admission: AD | Admit: 2010-02-01 | Discharge: 2010-02-03 | Payer: Self-pay | Admitting: Internal Medicine

## 2010-02-02 ENCOUNTER — Ambulatory Visit: Payer: Self-pay | Admitting: Infectious Disease

## 2010-02-05 ENCOUNTER — Telehealth: Payer: Self-pay | Admitting: Infectious Disease

## 2010-03-11 ENCOUNTER — Ambulatory Visit: Payer: Self-pay | Admitting: Infectious Disease

## 2010-03-11 LAB — CONVERTED CEMR LAB
Eosinophils Absolute: 0.3 10*3/uL (ref 0.0–0.7)
Glucose, Bld: 87 mg/dL (ref 70–99)
Leukocytes, UA: NEGATIVE
Lymphocytes Relative: 47 % — ABNORMAL HIGH (ref 12–46)
Lymphs Abs: 2.8 10*3/uL (ref 0.7–4.0)
MCV: 94.6 fL (ref 78.0–100.0)
Neutro Abs: 2.3 10*3/uL (ref 1.7–7.7)
Neutrophils Relative %: 38 % — ABNORMAL LOW (ref 43–77)
Nitrite: NEGATIVE
Platelets: 261 10*3/uL (ref 150–400)
Potassium: 4.3 meq/L (ref 3.5–5.3)
Protein, ur: NEGATIVE mg/dL
Sodium: 140 meq/L (ref 135–145)
Urobilinogen, UA: 0.2 (ref 0.0–1.0)
WBC: 5.9 10*3/uL (ref 4.0–10.5)

## 2010-03-26 ENCOUNTER — Ambulatory Visit: Payer: Self-pay | Admitting: Family Medicine

## 2010-05-13 ENCOUNTER — Ambulatory Visit: Payer: Self-pay | Admitting: Infectious Disease

## 2010-05-20 ENCOUNTER — Ambulatory Visit: Payer: Self-pay | Admitting: Urology

## 2010-06-23 ENCOUNTER — Encounter: Payer: Self-pay | Admitting: Infectious Disease

## 2010-06-26 ENCOUNTER — Ambulatory Visit: Payer: Self-pay | Admitting: Oncology

## 2010-06-27 LAB — CBC WITH DIFFERENTIAL/PLATELET
BASO%: 0.8 % (ref 0.0–2.0)
Eosinophils Absolute: 0.2 10*3/uL (ref 0.0–0.5)
HCT: 39.1 % (ref 34.8–46.6)
LYMPH%: 39 % (ref 14.0–49.7)
MCHC: 34.4 g/dL (ref 31.5–36.0)
MONO#: 0.6 10*3/uL (ref 0.1–0.9)
NEUT%: 44.2 % (ref 38.4–76.8)
Platelets: 216 10*3/uL (ref 145–400)
WBC: 4.9 10*3/uL (ref 3.9–10.3)

## 2010-06-27 LAB — COMPREHENSIVE METABOLIC PANEL
CO2: 26 mEq/L (ref 19–32)
Creatinine, Ser: 1.03 mg/dL (ref 0.40–1.20)
Glucose, Bld: 61 mg/dL — ABNORMAL LOW (ref 70–99)
Total Bilirubin: 0.6 mg/dL (ref 0.3–1.2)

## 2010-07-02 NOTE — Assessment & Plan Note (Signed)
Summary: HFU [MKJ]   Visit Type:  Follow-up Referring Provider:  brian cope, Primary Provider:  Mcgraff   History of Present Illness: 70 year old with multiple kidney stones,, recurrent UTIs , previously on macrobid. 2004 had kidneye stones with pyelonephritis. with colonization with ESBL. She was admitted for UTI by me and actually grew a fairly s e. coli and finished course of ciprofloxacin. She says that she had a little bit of burning once last week and then once over the weekend. This responded to pyridium. She did have three loos stools this am. She has no fevers, chills, nausea or flank pain.   Problems Prior to Update: 1)  Spotted Fevers  (ICD-082.0) 2)  Fatigue  (ICD-780.79) 3)  Tick Bite  (ICD-E906.4) 4)  Chest Pain Unspecified  (ICD-786.50) 5)  Oth & Uns Accidental Fall On Same Level  (ICD-E886.9) 6)  Arm Pain, Right  (ICD-729.5) 7)  Inf Microorg Resist-oth Spec Rx No Resist Mx Rx  (ICD-V09.80) 8)  Escherichia Coli Infection in Cce & Uns Site  (ICD-041.4) 9)  Neck Pain, Acute  (ICD-723.1) 10)  Chronic Airway Obstruction Nec  (ICD-496) 11)  Dysuria  (ICD-788.1) 12)  Urinary Tract Infection, Acute, Recurrent  (ICD-599.0) 13)  Hypercalcemia  (ICD-275.42) 14)  Degenerative Disc Disease  (ICD-722.6) 15)  Reflux, Esophageal  (ICD-530.81) 16)  Cardiomyopathy, Hypertrophic  (ICD-425.1) 17)  Asthmatic Bronchitis, Acute  (ICD-466.0) 18)  Allergic Rhinitis  (ICD-477.9)  Medications Prior to Update: 1)  Advair Diskus 250-50 Mcg/dose  Misc (Fluticasone-Salmeterol) .... One Puff Two Times A Day - Rinse Mouth Well After Use 2)  Furosemide 40 Mg  Tabs (Furosemide) .... Once Daily 3)  Levothyroxine Sodium 50 Mcg  Tabs (Levothyroxine Sodium) .... One Per Day 4)  Klor-Con 10 10 Meq  Tbcr (Potassium Chloride) 5)  Prilosec 40 Mg  Cpdr (Omeprazole) .... Once Daily 6)  Albuterol Sulfate (2.5 Mg/53ml) 0.083%  Nebu (Albuterol Sulfate) .... For As Needed in Nebulizer 7)  Pyridium 100 Mg Tabs  (Phenazopyridine Hcl) .Marland Kitchen.. 1 Tablet Every 6 Hours Prn 8)  Nitrostat 0.3 Mg Subl (Nitroglycerin) .Marland Kitchen.. 1 Sublingual As Needed 9)  Klor-Con 10 10 Meq Cr-Tabs (Potassium Chloride) .Marland Kitchen.. 1 Tablet Daily 10)  Furosemide 40 Mg Tabs (Furosemide) .Marland Kitchen.. 1 Tablet By Mouth Daily 11)  Levbid 0.375 Mg Xr12h-Tab (Hyoscyamine Sulfate) .Marland Kitchen.. 1 Tablet Two Times A Day 12)  Amlodipine Besylate 10 Mg Tabs (Amlodipine Besylate) .Marland Kitchen.. 1 Tablet Daily 13)  Tandem 162-115.2 Mg Caps (Ferrous Fum-Iron Polysacch) .Marland Kitchen.. 1 Capsule Daily 14)  Doxycycline Hyclate 100 Mg Tabs (Doxycycline Hyclate) .... Take 1 Tablet By Mouth Two Times A Day For 10 Days 15)  Fluconazole 150 Mg Tabs (Fluconazole) .... Take Once For Yeast Infeciton  Current Medications (verified): 1)  Advair Diskus 250-50 Mcg/dose  Misc (Fluticasone-Salmeterol) .... One Puff Two Times A Day - Rinse Mouth Well After Use 2)  Furosemide 40 Mg  Tabs (Furosemide) .... Once Daily 3)  Levothyroxine Sodium 50 Mcg  Tabs (Levothyroxine Sodium) .... One Per Day 4)  Klor-Con 10 10 Meq  Tbcr (Potassium Chloride) 5)  Prilosec 40 Mg  Cpdr (Omeprazole) .... Once Daily 6)  Albuterol Sulfate (2.5 Mg/75ml) 0.083%  Nebu (Albuterol Sulfate) .... For As Needed in Nebulizer 7)  Pyridium 100 Mg Tabs (Phenazopyridine Hcl) .Marland Kitchen.. 1 Tablet Every 6 Hours Prn 8)  Nitrostat 0.3 Mg Subl (Nitroglycerin) .Marland Kitchen.. 1 Sublingual As Needed 9)  Klor-Con 10 10 Meq Cr-Tabs (Potassium Chloride) .Marland Kitchen.. 1 Tablet Daily 10)  Furosemide 40  Mg Tabs (Furosemide) .Marland Kitchen.. 1 Tablet By Mouth Daily 11)  Levbid 0.375 Mg Xr12h-Tab (Hyoscyamine Sulfate) .Marland Kitchen.. 1 Tablet Two Times A Day 12)  Amlodipine Besylate 10 Mg Tabs (Amlodipine Besylate) .Marland Kitchen.. 1 Tablet Daily 13)  Tandem 162-115.2 Mg Caps (Ferrous Fum-Iron Polysacch) .Marland Kitchen.. 1 Capsule Daily 14)  Doxycycline Hyclate 100 Mg Tabs (Doxycycline Hyclate) .... Take 1 Tablet By Mouth Two Times A Day For 10 Days 15)  Fluconazole 150 Mg Tabs (Fluconazole) .... Take Once For Yeast  Infeciton  Allergies: 1)  ! * Talocid 2)  ! Macrobid 3)  ! Codeine 4)  ! * Oxycontin    Current Allergies: ! * TALOCID ! MACROBID ! CODEINE ! * OXYCONTIN Past History:  Past Medical History: Last updated: 06/18/2009 Allergic rhinitis Multiple kidney stones Recurrent UTIs COPD on home oxygen Cardiomyopathy CKD Atrophyic kidney GERD  Past Surgical History: Last updated: 07/19/2009 No recent surgerys She has had cystoscopies lumbar back surgery remote  Family History: Last updated: 06/18/2009 noncontributory  Social History: Last updated: 06/18/2009 former smoker, no alcohol, no recreational drugs  Risk Factors: Alcohol Use: 0 (01/30/2010) Caffeine Use: 0 (01/30/2010) Exercise: no (01/30/2010)  Risk Factors: Smoking Status: never (01/30/2010)  Family History: Reviewed history from 06/18/2009 and no changes required. noncontributory  Social History: Reviewed history from 06/18/2009 and no changes required. former smoker, no alcohol, no recreational drugs  Review of Systems  The patient denies anorexia, fever, weight loss, weight gain, vision loss, decreased hearing, hoarseness, chest pain, syncope, dyspnea on exertion, peripheral edema, prolonged cough, headaches, hemoptysis, abdominal pain, melena, hematochezia, severe indigestion/heartburn, hematuria, incontinence, genital sores, muscle weakness, suspicious skin lesions, transient blindness, difficulty walking, depression, unusual weight change, abnormal bleeding, and enlarged lymph nodes.    Physical Exam  General:  alert, well-developed, well-nourished, and overweight-appearing.   Head:  normocephalic, atraumatic, and no abnormalities observed.   Eyes:  vision grossly intact, pupils equal, pupils round, and pupils reactive to light.   Ears:  no external deformities and ear piercing(s) noted.   Nose:  no external deformity and no external erythema.   Mouth:  pharynx pink and moist, no erythema, and  no exudates.   Neck:  supple and full ROM.   Lungs:  prolonged exp phase, no wheezes rhonchi or rales Heart:  normal rate, regular rhythm, no murmur, and no gallop.   Abdomen:  soft, normal bowel sounds, and no distention.  minimally tender to palpation in lower quadrants. Msk:  nontender over cva Extremities:  1+ left pedal edema and 1+ right pedal edema.   Neurologic:  alert & oriented X3,    Impression & Recommendations:  Problem # 1:  URINARY TRACT INFECTION, ACUTE, RECURRENT (ICD-599.0) not clearly having uti at present but since she has SOME symptoms and has urine colletion we will check ua and culture and if she then develops symptoms we may already have a culprit organism to target. I dont think she has clear cut uti at present Her updated medication list for this problem includes:    Pyridium 100 Mg Tabs (Phenazopyridine hcl) .Marland Kitchen... 1 tablet every 6 hours prn    Doxycycline Hyclate 100 Mg Tabs (Doxycycline hyclate) .Marland Kitchen... Take 1 tablet by mouth two times a day for 10 days  Orders: T-Basic Metabolic Panel [phone]) T-CBC w/Diff [id number]) T-Culture, Urine ([id number]) T-Urinalysis (81003-65000) Est. Patient Level IV (62952)  Problem # 2:  ESCHERICHIA COLI INFECTION IN CCE & UNS SITE (ICD-041.4)  see above disucssion  Orders: Est. Patient Level IV (84132)  Problem # 3:  INF MICROORG RESIST-OTH SPEC RX NO RESIST MX RX (ICD-V09.80)  see above, she actually grew a sensitive e coli at last admission  Orders: Est. Patient Level IV (16109)  Problem # 4:  DYSURIA (ICD-788.1) some of this seems to be due to something noninfectious and responds to pyridium wihtout abx Her updated medication list for this problem includes:    Pyridium 100 Mg Tabs (Phenazopyridine hcl) .Marland Kitchen... 1 tablet every 6 hours prn    Doxycycline Hyclate 100 Mg Tabs (Doxycycline hyclate) .Marland Kitchen... Take 1 tablet by mouth two times a day for 10 days  Orders: Est. Patient Level IV (60454)  Patient  Instructions: 1)  we will check some labs today  2)  let us know if symptoms return or worsen 3)  rtc to see Dr. Daiva Eves in December

## 2010-07-02 NOTE — Assessment & Plan Note (Signed)
Summary: resfrm 3/17kam   Visit Type:  Follow-up Referring Provider:  brian cope, Primary Provider:  Gus Rankin  CC:  follow-up visit, results of MRI, pt. fell one week ago injured right arm, and c/o knot on abdomen from injection.  History of Present Illness: 70 year old with multiple kidney stones,, recurrent UTIs , previously on macrobid. 2004 had kidneye stones with pyelonephritis. with colonization with ESBL. I have been attempting to educate pt and get her to educate other providers such as ED MDS to not unnecessarily rx her antibiotics. Last month when she was about to undergo an MRI to evalute her neck pain she developed  shortness  of breath, chest pain, jaw pain, diarrhea, vomtiing and nausea. the night prior to her MRI and had chest pain when scheduled to go for MRI. I adivsed her to go to ED adnd she was admitted  admitted to [facility] of 21st of February.They did stress test, CTAngio were negative. Unfortunately in typical manner of ED her urine was checked and she was found of course to have pyuria and was treateed with two weeks of bactrim. I do not have any of the labs from  or the notes. Additionally approximately  She had fall 2 weeks ago She was getting up out of bed apparently too rapidly and suddently felt light headed and fell to floor landing on her right arm. where she continues to have pain. that is throbbing at times.  She is currently without any symptoms of dysuria which is rather unusual for her as she typically always has low grade discomfort.  Problems Prior to Update: 1)  Inf Microorg Resist-oth Spec Rx No Resist Mx Rx  (ICD-V09.80) 2)  Escherichia Coli Infection in Cce & Uns Site  (ICD-041.4) 3)  Neck Pain, Acute  (ICD-723.1) 4)  Chronic Airway Obstruction Nec  (ICD-496) 5)  Dysuria  (ICD-788.1) 6)  Urinary Tract Infection, Acute, Recurrent  (ICD-599.0) 7)  Hypercalcemia  (ICD-275.42) 8)  Degenerative Disc Disease  (ICD-722.6) 9)  Reflux, Esophageal   (ICD-530.81) 10)  Cardiomyopathy, Hypertrophic  (ICD-425.1) 11)  Asthmatic Bronchitis, Acute  (ICD-466.0) 12)  Allergic Rhinitis  (ICD-477.9)  Medications Prior to Update: 1)  Advair Diskus 250-50 Mcg/dose  Misc (Fluticasone-Salmeterol) .... One Puff Two Times A Day - Rinse Mouth Well After Use 2)  Furosemide 40 Mg  Tabs (Furosemide) .... Once Daily 3)  Levothyroxine Sodium 50 Mcg  Tabs (Levothyroxine Sodium) .... One Per Day 4)  Klor-Con 10 10 Meq  Tbcr (Potassium Chloride) 5)  Prilosec 40 Mg  Cpdr (Omeprazole) .... Once Daily 6)  Albuterol Sulfate (2.5 Mg/69ml) 0.083%  Nebu (Albuterol Sulfate) .... For As Needed in Nebulizer 7)  Pyridium 100 Mg Tabs (Phenazopyridine Hcl) .Marland Kitchen.. 1 Tablet Every 6 Hours Prn 8)  Nitrostat 0.3 Mg Subl (Nitroglycerin) .Marland Kitchen.. 1 Sublingual As Needed 9)  Klor-Con 10 10 Meq Cr-Tabs (Potassium Chloride) .Marland Kitchen.. 1 Tablet Daily 10)  Furosemide 40 Mg Tabs (Furosemide) .Marland Kitchen.. 1 Tablet By Mouth Daily 11)  Levbid 0.375 Mg Xr12h-Tab (Hyoscyamine Sulfate) .Marland Kitchen.. 1 Tablet Two Times A Day 12)  Amlodipine Besylate 10 Mg Tabs (Amlodipine Besylate) .Marland Kitchen.. 1 Tablet Daily 13)  Tandem 162-115.2 Mg Caps (Ferrous Fum-Iron Polysacch) .Marland Kitchen.. 1 Capsule Daily  Current Medications (verified): 1)  Advair Diskus 250-50 Mcg/dose  Misc (Fluticasone-Salmeterol) .... One Puff Two Times A Day - Rinse Mouth Well After Use 2)  Furosemide 40 Mg  Tabs (Furosemide) .... Once Daily 3)  Levothyroxine Sodium 50 Mcg  Tabs (Levothyroxine Sodium) .Marland KitchenMarland KitchenMarland Kitchen  One Per Day 4)  Klor-Con 10 10 Meq  Tbcr (Potassium Chloride) 5)  Prilosec 40 Mg  Cpdr (Omeprazole) .... Once Daily 6)  Albuterol Sulfate (2.5 Mg/53ml) 0.083%  Nebu (Albuterol Sulfate) .... For As Needed in Nebulizer 7)  Pyridium 100 Mg Tabs (Phenazopyridine Hcl) .Marland Kitchen.. 1 Tablet Every 6 Hours Prn 8)  Nitrostat 0.3 Mg Subl (Nitroglycerin) .Marland Kitchen.. 1 Sublingual As Needed 9)  Klor-Con 10 10 Meq Cr-Tabs (Potassium Chloride) .Marland Kitchen.. 1 Tablet Daily 10)  Furosemide 40 Mg Tabs  (Furosemide) .Marland Kitchen.. 1 Tablet By Mouth Daily 11)  Levbid 0.375 Mg Xr12h-Tab (Hyoscyamine Sulfate) .Marland Kitchen.. 1 Tablet Two Times A Day 12)  Amlodipine Besylate 10 Mg Tabs (Amlodipine Besylate) .Marland Kitchen.. 1 Tablet Daily 13)  Tandem 162-115.2 Mg Caps (Ferrous Fum-Iron Polysacch) .Marland Kitchen.. 1 Capsule Daily  Allergies: 1)  ! * Talocid 2)  ! Macrobid 3)  ! Codeine 4)  ! * Oxycontin   Preventive Screening-Counseling & Management  Alcohol-Tobacco     Alcohol drinks/day: 0     Smoking Status: never  Caffeine-Diet-Exercise     Caffeine use/day: 0   Current Allergies (reviewed today): ! * TALOCID ! MACROBID ! CODEINE ! * OXYCONTIN Past History:  Past Medical History: Last updated: 06/18/2009 Allergic rhinitis Multiple kidney stones Recurrent UTIs COPD on home oxygen Cardiomyopathy CKD Atrophyic kidney GERD  Past Surgical History: Last updated: 07/19/2009 No recent surgerys She has had cystoscopies lumbar back surgery remote  Family History: Last updated: 06/18/2009 noncontributory  Social History: Last updated: 06/18/2009 former smoker, no alcohol, no recreational drugs  Risk Factors: Alcohol Use: 0 (08/20/2009) Caffeine Use: 0 (08/20/2009)  Risk Factors: Smoking Status: never (08/20/2009)  Review of Systems       The patient complains of chest pain.  The patient denies fever, weight loss, weight gain, vision loss, decreased hearing, hoarseness, syncope, dyspnea on exertion, peripheral edema, prolonged cough, headaches, hemoptysis, abdominal pain, melena, hematochezia, severe indigestion/heartburn, hematuria, incontinence, genital sores, muscle weakness, suspicious skin lesions, transient blindness, difficulty walking, depression, unusual weight change, abnormal bleeding, and enlarged lymph nodes.         see HPI  Vital Signs:  Patient profile:   70 year old female Height:      65 inches (165.10 cm) Weight:      230.8 pounds (104.91 kg) BMI:     38.55 Temp:     97.7 degrees F  (36.50 degrees C) oral Pulse rate:   74 / minute BP sitting:   158 / 78  (left arm)  Vitals Entered By: Wendall Mola CMA Duncan Dull) (August 20, 2009 2:58 PM) CC: follow-up visit, results of MRI, pt. fell one week ago injured right arm, c/o knot on abdomen from injection Pain Assessment Patient in pain? yes     Location: right arm Intensity: 3 Type: aching Onset of pain  Intermittent Nutritional Status BMI of > 30 = obese Nutritional Status Detail appetite "OK"  Does patient need assistance? Functional Status Cook/clean, Shopping, Social activities Ambulation Impaired:Risk for fall Comments pt. uses walker   Physical Exam  General:  alert, well-developed, well-nourished, and overweight-appearing.   Head:  normocephalic, atraumatic, and no abnormalities observed.   Eyes:  vision grossly intact, pupils equal, pupils round, and pupils reactive to light.   Ears:  no external deformities and ear piercing(s) noted.   Nose:  no external deformity and no external erythema.   Mouth:  pharynx pink and moist, no erythema, and no exudates.   Neck:  supple and full ROM.  Lungs:  prolonged exp phase, no wheezes rhonchi or rales Heart:  normal rate, regular rhythm, no murmur, and no gallop.   Abdomen:  soft, normal bowel sounds, and no distention.  minimal suprapubic tenderness Neurologic:  alert & oriented X3, cranial nerves II-XII intact, and strength normal in all extremities Skin:  no rashes.  pale Psych:  Oriented X3, memory intact for recent and remote, and normally interactive.     Impression & Recommendations:  Problem # 1:  URINARY TRACT INFECTION, ACUTE, RECURRENT (ICD-599.0)  No evidence of UTI at present. If she can avoid EDs and physicians that rx her in absence of symptoms perhaps we can select for less resistant pathogens as part of her residetn colonizers in urine. Removing stones as much as possile per urology also of benefit to remove strucural causes of recurrent  colonization and infection Her updated medication list for this problem includes:    Pyridium 100 Mg Tabs (Phenazopyridine hcl) .Marland Kitchen... 1 tablet every 6 hours prn  Orders: Est. Patient Level IV (96295)  Problem # 2:  ARM PAIN, RIGHT (ICD-729.5) checked xray and no fracture. LIkely bruise Orders: Diagnostic X-Ray/Fluoroscopy (Diagnostic X-Ray/Flu) Est. Patient Level IV (28413)  Problem # 3:  OTH & UNS ACCIDENTAL FALL ON SAME LEVEL (ICD-E886.9)  sounds lke orthostatic near syncope. Will refer her back to PCP for workup of this. Has been feeling better since.  Orders: Est. Patient Level IV (24401)  Problem # 4:  CHEST PAIN UNSPECIFIED (ICD-786.50)  likely due to anxiety  Orders: Est. Patient Level IV (02725)  Problem # 5:  NECK PAIN, ACUTE (ICD-723.1)  MRI showed no evidence of infeciton. Will defer furrher workup and management to PCP  Orders: Est. Patient Level IV (36644)  Patient Instructions: 1)  followup appt in May, 2011

## 2010-07-02 NOTE — Progress Notes (Signed)
Summary: upper abdomen pain  Phone Note Call from Patient   Caller: Patient Reason for Call: Acute Illness, Privacy/Consent Authorization Summary of Call: Pt. had appt. with Dr. Daiva Eves yesterday 01/21/10 and missed appt. got dates confused.  C/O abdominal pain with burning in upper abdomen and wanted to be seen today or tomorrow, no openings. Pt. was advised to go to urgent care in [facility] and she said she would do that. Initial call taken by: Wendall Mola CMA Duncan Dull),  January 22, 2010 8:56 AM  Follow-up for Phone Call        If she appears to have UTI they should get in touch with [place] because she has had a highly R E coli in the past  Follow-up by: Acey Lav MD,  January 22, 2010 12:59 PM

## 2010-07-02 NOTE — Miscellaneous (Signed)
Summary: [facility]: Home Health Cert[facility] Plan Of Care  [facility]: [facility] Plan Of Care   Imported By: Florinda Marker 07/19/2009 15:13:31  _____________________________________________________________________  External Attachment:    Type:   Image     Comment:   External Document

## 2010-07-02 NOTE — Assessment & Plan Note (Signed)
Summary: 2 MONTH F/U/VS   Visit Type:  Follow-up Referring Provider:  brian cope, Primary Provider:  Mcgraff  CC:  shoulder pain and found a tick and felt drained since and had some diarrhea.  History of Present Illness: 70 year old with multiple kidney stones,, recurrent UTIs , previously on macrobid. 2004 had kidneye stones with pyelonephritis. with colonization with ESB She is currently without any symptoms of dysuria, She had a few days of discomfort that improved with pyridiumt. She was bitten by tick, removed by her husband last Thursday from her waist line. She believes it was a "deer tick.". She had diarrhea for 2 days. For two days she had malaise. She has not noticed fever, headache or a rash. I informed her howoever that I thought it would be best to treat her for RMSF/ehrlichia empirically.  Problems Prior to Update: 1)  Chest Pain Unspecified  (ICD-786.50) 2)  Oth & Uns Accidental Fall On Same Level  (ICD-E886.9) 3)  Arm Pain, Right  (ICD-729.5) 4)  Inf Microorg Resist-oth Spec Rx No Resist Mx Rx  (ICD-V09.80) 5)  Escherichia Coli Infection in Cce & Uns Site  (ICD-041.4) 6)  Neck Pain, Acute  (ICD-723.1) 7)  Chronic Airway Obstruction Nec  (ICD-496) 8)  Dysuria  (ICD-788.1) 9)  Urinary Tract Infection, Acute, Recurrent  (ICD-599.0) 10)  Hypercalcemia  (ICD-275.42) 11)  Degenerative Disc Disease  (ICD-722.6) 12)  Reflux, Esophageal  (ICD-530.81) 13)  Cardiomyopathy, Hypertrophic  (ICD-425.1) 14)  Asthmatic Bronchitis, Acute  (ICD-466.0) 15)  Allergic Rhinitis  (ICD-477.9)  Medications Prior to Update: 1)  Advair Diskus 250-50 Mcg/dose  Misc (Fluticasone-Salmeterol) .... One Puff Two Times A Day - Rinse Mouth Well After Use 2)  Furosemide 40 Mg  Tabs (Furosemide) .... Once Daily 3)  Levothyroxine Sodium 50 Mcg  Tabs (Levothyroxine Sodium) .... One Per Day 4)  Klor-Con 10 10 Meq  Tbcr (Potassium Chloride) 5)  Prilosec 40 Mg  Cpdr (Omeprazole) .... Once Daily 6)  Albuterol  Sulfate (2.5 Mg/53ml) 0.083%  Nebu (Albuterol Sulfate) .... For As Needed in Nebulizer 7)  Pyridium 100 Mg Tabs (Phenazopyridine Hcl) .Marland Kitchen.. 1 Tablet Every 6 Hours Prn 8)  Nitrostat 0.3 Mg Subl (Nitroglycerin) .Marland Kitchen.. 1 Sublingual As Needed 9)  Klor-Con 10 10 Meq Cr-Tabs (Potassium Chloride) .Marland Kitchen.. 1 Tablet Daily 10)  Furosemide 40 Mg Tabs (Furosemide) .Marland Kitchen.. 1 Tablet By Mouth Daily 11)  Levbid 0.375 Mg Xr12h-Tab (Hyoscyamine Sulfate) .Marland Kitchen.. 1 Tablet Two Times A Day 12)  Amlodipine Besylate 10 Mg Tabs (Amlodipine Besylate) .Marland Kitchen.. 1 Tablet Daily 13)  Tandem 162-115.2 Mg Caps (Ferrous Fum-Iron Polysacch) .Marland Kitchen.. 1 Capsule Daily  Current Medications (verified): 1)  Advair Diskus 250-50 Mcg/dose  Misc (Fluticasone-Salmeterol) .... One Puff Two Times A Day - Rinse Mouth Well After Use 2)  Furosemide 40 Mg  Tabs (Furosemide) .... Once Daily 3)  Levothyroxine Sodium 50 Mcg  Tabs (Levothyroxine Sodium) .... One Per Day 4)  Klor-Con 10 10 Meq  Tbcr (Potassium Chloride) 5)  Prilosec 40 Mg  Cpdr (Omeprazole) .... Once Daily 6)  Albuterol Sulfate (2.5 Mg/47ml) 0.083%  Nebu (Albuterol Sulfate) .... For As Needed in Nebulizer 7)  Pyridium 100 Mg Tabs (Phenazopyridine Hcl) .Marland Kitchen.. 1 Tablet Every 6 Hours Prn 8)  Nitrostat 0.3 Mg Subl (Nitroglycerin) .Marland Kitchen.. 1 Sublingual As Needed 9)  Klor-Con 10 10 Meq Cr-Tabs (Potassium Chloride) .Marland Kitchen.. 1 Tablet Daily 10)  Furosemide 40 Mg Tabs (Furosemide) .Marland Kitchen.. 1 Tablet By Mouth Daily 11)  Levbid 0.375 Mg Xr12h-Tab (Hyoscyamine Sulfate) .Marland KitchenMarland KitchenMarland Kitchen  1 Tablet Two Times A Day 12)  Amlodipine Besylate 10 Mg Tabs (Amlodipine Besylate) .Marland Kitchen.. 1 Tablet Daily 13)  Tandem 162-115.2 Mg Caps (Ferrous Fum-Iron Polysacch) .Marland Kitchen.. 1 Capsule Daily 14)  Doxycycline Hyclate 100 Mg Tabs (Doxycycline Hyclate) .... Take 1 Tablet By Mouth Two Times A Day For 10 Days  Allergies (verified): 1)  ! * Talocid 2)  ! Macrobid 3)  ! Codeine 4)  ! * Oxycontin    Current Allergies (reviewed today): ! * TALOCID ! MACROBID !  CODEINE ! * OXYCONTIN Past History:  Past Medical History: Last updated: 06/18/2009 Allergic rhinitis Multiple kidney stones Recurrent UTIs COPD on home oxygen Cardiomyopathy CKD Atrophyic kidney GERD  Past Surgical History: Last updated: 07/19/2009 No recent surgerys She has had cystoscopies lumbar back surgery remote  Family History: Last updated: 06/18/2009 noncontributory  Social History: Last updated: 06/18/2009 former smoker, no alcohol, no recreational drugs  Risk Factors: Alcohol Use: 0 (08/20/2009) Caffeine Use: 0 (08/20/2009)  Risk Factors: Smoking Status: never (08/20/2009)  Family History: Reviewed history from 06/18/2009 and no changes required. noncontributory  Social History: Reviewed history from 06/18/2009 and no changes required. former smoker, no alcohol, no recreational drugs  Review of Systems       The patient complains of muscle weakness and suspicious skin lesions.  The patient denies anorexia, fever, weight loss, weight gain, vision loss, decreased hearing, hoarseness, chest pain, syncope, dyspnea on exertion, peripheral edema, prolonged cough, headaches, hemoptysis, abdominal pain, melena, hematochezia, severe indigestion/heartburn, hematuria, incontinence, genital sores, transient blindness, difficulty walking, depression, unusual weight change, abnormal bleeding, and enlarged lymph nodes.    Vital Signs:  Patient profile:   70 year old female Height:      65 inches (165.10 cm) Weight:      229 pounds (104.09 kg) BMI:     38.25 Temp:     97.6 degrees F (36.44 degrees C) oral Pulse rate:   76 / minute BP sitting:   163 / 84  (right arm)  Vitals Entered By: Baxter Hire) (Oct 22, 2009 10:32 AM) CC: shoulder pain, found a tick and felt drained since and had some diarrhea Is Patient Diabetic? No Pain Assessment Patient in pain? yes     Location: shoulder Intensity: 4 Type: aching  Does patient need  assistance? Functional Status Self care Ambulation Impaired:Risk for fall Comments walks with cane   Physical Exam  General:  alert, well-developed, well-nourished, and overweight-appearing.   Head:  normocephalic, atraumatic, and no abnormalities observed.   Eyes:  vision grossly intact, pupils equal, pupils round, and pupils reactive to light.   Ears:  no external deformities and ear piercing(s) noted.   Nose:  no external deformity and no external erythema.   Mouth:  pharynx pink and moist, no erythema, and no exudates.   Neck:  supple and full ROM.   Lungs:  prolonged exp phase, no wheezes rhonchi or rales Heart:  normal rate, regular rhythm, no murmur, and no gallop.   Abdomen:  soft, normal bowel sounds, and no distention.  non tender Extremities:  1+ left pedal edema and 1+ right pedal edema.   Neurologic:  alert & oriented X3,  Skin:  erytthematous area along her waist line where she had been bitten by tick. She has no petechail or other rashes that I can find Psych:  Oriented X3, memory intact for recent and remote, and normally interactive.     Impression & Recommendations:  Problem # 1:  TICK BITE (ICD-E906.4)  Based on her ssx of malaise I will rx her with doxycylien for 10 days for possible RMSF or Ehrlichia. I wil check baseline serologies for reference if pcp wanted to recheck in 2 weeks to confirm dx serologically (not really necessary unlesss she is still having ssx in whihc case it was likely wrong dx) Orders: T-CMP with estimated GFR ([id number]) T-CBC w/Diff ([id number]) T- * Misc. Laboratory test [phone]) T- * Misc. Laboratory test [phone]) Est. Patient Level IV (65784)  Problem # 2:  FATIGUE (ICD-780.79)  could be due to rmsf or ehrlichia  Orders: Est. Patient Level IV (69629)  Problem # 3:  ARM PAIN, RIGHT (ICD-729.5)  she had another film done by pCP fo this site  Orders: Est. Patient Level IV (52841)  Problem # 4:  URINARY TRACT INFECTION,  ACUTE, RECURRENT (ICD-599.0)  NOt active and withour recent recurrence. Again prudent use of antibiotics is the rule in this pt. Her updated medication list for this problem includes:    Pyridium 100 Mg Tabs (Phenazopyridine hcl) .Marland Kitchen... 1 tablet every 6 hours prn    Doxycycline Hyclate 100 Mg Tabs (Doxycycline hyclate) .Marland Kitchen... Take 1 tablet by mouth two times a day for 10 days  Orders: Est. Patient Level IV (32440)  Problem # 5:  ESCHERICHIA COLI INFECTION IN CCE & UNS SITE (ICD-041.4)  reason why I would always recommend getting a cultrure if rx for uti being considered. GIven her hx would be concerned that this ESBL would be culprit, hopefully by cutting down on abx use we can allow a differnt bactera to beocome dominant colonizer in her  Orders: Est. Patient Level IV (10272)  Medications Added to Medication List This Visit: 1)  Doxycycline Hyclate 100 Mg Tabs (Doxycycline hyclate) .... Take 1 tablet by mouth two times a day for 10 days  Patient Instructions: 1)  Make appt for followup with Dr. Daiva Eves in 3-4 months 2)  Take doxycyline for 10 days Prescriptions: DOXYCYCLINE HYCLATE 100 MG TABS (DOXYCYCLINE HYCLATE) Take 1 tablet by mouth two times a day for 10 days  #20 x 1   Entered and Authorized by:   Acey Lav MD   Signed by:   Paulette Blanch Dam MD on 10/22/2009   Method used:   Electronically to        [facility] [address] (retail)       [address]       [place], [place]  [address]       Ph: [phone]       Fax: [phone]   RxID:   [id number]

## 2010-07-02 NOTE — Assessment & Plan Note (Signed)
Summary: 11:15 new pt multdrug urinary inf   Visit Type:  Consult Referring Provider:  brian cope,  CC:  new patient recurrent uti, back pain, and foul urine smell.  History of Present Illness: 70 year old with multiple kidney stones,, recurrent UTIs , previously on macrobid. 2004 had kidneye stones with pyelonephritis. She had macrobid kept things under control for quite some time, but was taken off of this becasue it "caysed liver damage" She has since been put on chronic  doxycylne. She has been treate for UTIs nearly every month and often every two weeks. She was formerly being followed by [facility] Aloe in [place] for this along with her urologist Dr. Achilles Dunk. I was called in early January because she was likely growing an ESBL from her urine and she could no longer get IV abx from Dr. Blockers office due to insurance change. I advised Dr. Wynn Maudlin office at that time that if the pt was suffiicently ill to admit her to the hospital for IV antibiotics. This never happened however. In the interim she has continued to have burning that she has nearly always (the only exception being for 18 days post rx with invanz this summer), lower back pain and foul smelling urine. She was initally givine cipro and felt a bit better. She has continuedher doxycyline. She was given pyridium by her urologist. She actually has felt a bit better the last two days but she is still complaining of malaise, dysuria and back pain. We had extensive conversation and I spent >62 minutes of face to face time and in reviewinng labs, counselling the patient.  She has no fevers, chills, nausea but she has not had these other than "when I became septic in 2004".  She is still co of lower back pain.   Feet swelling. No fevers currently. Urine continues to smell foul. The burning has gotten better with  phenazapyridium.   Problems Prior to Update: 1)  Hypercalcemia  (ICD-275.42) 2)  Degenerative Disc Disease  (ICD-722.6) 3)  Reflux,  Esophageal  (ICD-530.81) 4)  Cardiomyopathy, Hypertrophic  (ICD-425.1) 5)  Asthmatic Bronchitis, Acute  (ICD-466.0) 6)  Allergic Rhinitis  (ICD-477.9)  Medications Prior to Update: 1)  Advair Diskus 250-50 Mcg/dose  Misc (Fluticasone-Salmeterol) .... One Puff Two Times A Day - Rinse Mouth Well After Use 2)  Furosemide 40 Mg  Tabs (Furosemide) .... Once Daily 3)  Levothyroxine Sodium 50 Mcg  Tabs (Levothyroxine Sodium) .... One Per Day 4)  Klor-Con 10 10 Meq  Tbcr (Potassium Chloride) 5)  Prilosec 40 Mg  Cpdr (Omeprazole) .... Once Daily 6)  Nasonex 50 Mcg/act  Susp (Mometasone Furoate) 7)  Lisinopril 20 Mg  Tabs (Lisinopril) .Marland Kitchen.. 1 1/2 Qd 8)  Albuterol Sulfate (2.5 Mg/76ml) 0.083%  Nebu (Albuterol Sulfate) .... For As Needed in Nebulizer  Current Medications (verified): 1)  Advair Diskus 250-50 Mcg/dose  Misc (Fluticasone-Salmeterol) .... One Puff Two Times A Day - Rinse Mouth Well After Use 2)  Furosemide 40 Mg  Tabs (Furosemide) .... Once Daily 3)  Levothyroxine Sodium 50 Mcg  Tabs (Levothyroxine Sodium) .... One Per Day 4)  Klor-Con 10 10 Meq  Tbcr (Potassium Chloride) 5)  Prilosec 40 Mg  Cpdr (Omeprazole) .... Once Daily 6)  Albuterol Sulfate (2.5 Mg/64ml) 0.083%  Nebu (Albuterol Sulfate) .... For As Needed in Nebulizer 7)  Pyridium 100 Mg Tabs (Phenazopyridine Hcl) .Marland Kitchen.. 1 Tablet Every 6 Hours Prn 8)  Nitrostat 0.3 Mg Subl (Nitroglycerin) .Marland Kitchen.. 1 Sublingual As Needed 9)  Klor-Con  10 10 Meq Cr-Tabs (Potassium Chloride) .Marland Kitchen.. 1 Tablet Daily 10)  Furosemide 40 Mg Tabs (Furosemide) .Marland Kitchen.. 1 Tablet By Mouth Daily 11)  Levbid 0.375 Mg Xr12h-Tab (Hyoscyamine Sulfate) .Marland Kitchen.. 1 Tablet Two Times A Day 12)  Amlodipine Besylate 10 Mg Tabs (Amlodipine Besylate) .Marland Kitchen.. 1 Tablet Daily 13)  Tandem 162-115.2 Mg Caps (Ferrous Fum-Iron Polysacch) .Marland Kitchen.. 1 Capsule Daily 14)  Invanz 1 Gm Solr (Ertapenem Sodium) .... Once Daily For 2 Weeks  Allergies (verified): 1)  ! * Talocid 2)  ! Macrobid   Preventive  Screening-Counseling & Management  Alcohol-Tobacco     Smoking Status: never   Current Allergies (reviewed today): ! * TALOCID ! MACROBID Past History:  Past Medical History: Allergic rhinitis Multiple kidney stones Recurrent UTIs COPD on home oxygen Cardiomyopathy CKD Atrophyic kidney GERD  Past Surgical History: No recent surgerys She has had cystoscopies  Family History: noncontributory  Social History: former smoker, no alcohol, no recreational drugs  Review of Systems       The patient complains of peripheral edema.  The patient denies anorexia, fever, weight loss, weight gain, vision loss, decreased hearing, hoarseness, chest pain, syncope, dyspnea on exertion, prolonged cough, headaches, hemoptysis, abdominal pain, melena, hematochezia, severe indigestion/heartburn, hematuria, incontinence, genital sores, muscle weakness, suspicious skin lesions, difficulty walking, depression, unusual weight change, abnormal bleeding, and enlarged lymph nodes.    Vital Signs:  Patient profile:   70 year old female Height:      65 inches (165.10 cm) Weight:      226 pounds (102.73 kg) BMI:     37.74 Temp:     96.9 degrees F (36.06 degrees C) oral Pulse rate:   68 / minute BP sitting:   136 / 76  (left arm)  Vitals Entered By: Starleen Arms CMA (June 18, 2009 10:58 AM) CC: new patient recurrent uti, back pain, foul urine smell Is Patient Diabetic? No Pain Assessment Patient in pain? no      Nutritional Status BMI of > 30 = obese Nutritional Status Detail nl  Does patient need assistance? Functional Status Self care Ambulation Impaired:Risk for fall   Physical Exam  General:  alert, well-developed, well-nourished, and overweight-appearing.   Head:  normocephalic, atraumatic, and no abnormalities observed.   Eyes:  vision grossly intact, pupils equal, pupils round, and pupils reactive to light.   Ears:  no external deformities and ear piercing(s) noted.     Nose:  no external deformity and no external erythema.   Mouth:  pharynx pink and moist, no erythema, and no exudates.   Neck:  supple and full ROM.   Lungs:  prolonged exp phase, no wheezes rhonchi or rales Heart:  normal rate, regular rhythm, no murmur, and no gallop.   Abdomen:  soft, normal bowel sounds, and no distention.  minimal suprapubic tenderness Msk:  tenderness in lower back, not quite the CVA but lower Extremities:  1+ left pedal edema and 1+ right pedal edema.   Neurologic:  alert & oriented X3, cranial nerves II-XII intact, and strength normal in all extremities.  walks with waker Skin:  no rashes.  pale Psych:  Oriented X3, memory intact for recent and remote, and normally interactive.     Impression & Recommendations:  Problem # 1:  URINARY TRACT INFECTION, ACUTE, RECURRENT (ICD-599.0) Assessment Comment Only The trick here is knowing when to treat and when not to treat. She is not going to clear her urine of bacteria i the near futture. We should get her  off of cipro and doxycyline which will not suppress her ESBL but instead select for this. I think we could have gotten by without pulling trigger today but I am going to go ahead and treat with 2 weeks of invanz via home health vs infusion at [facility]. We are coordiating this as I type. Her ESBL was S to amickacin, carbapenems, macrobid otherwise completely resistant. In future we are going to have to hold off and NOT treat unless her symptoms escalate> I would support her urologist doing everything he can to remove stones that are remaining and are likely nidus for her recurrent infections.  Her updated medication list for this problem includes:    Pyridium 100 Mg Tabs (Phenazopyridine hcl) .Marland Kitchen... 1 tablet every 6 hours prn    Invanz 1 Gm Solr (Ertapenem sodium) ..... Once daily for 2 weeks  Orders: T-Comprehensive Metabolic Panel [id number]) T-CBC w/Diff ([id number]) T-Culture, Urine ([id number]) T-Urinalysis  ([id number]) Diagnostic X-Ray/Fluoroscopy (Diagnostic X-Ray/Flu) Consultation Level V (22979)  Problem # 2:  CARDIOMYOPATHY, HYPERTROPHIC (ICD-425.1)  her lower extremity edema is from this not uti (as she seems to thnk( fu with her PCP for this  Orders: Consultation Level V (89211)  Problem # 3:  REFLUX, ESOPHAGEAL (ICD-530.81)  she is on ppir for this Her updated medication list for this problem includes:    Prilosec 40 Mg Cpdr (Omeprazole) ..... Once daily    Levbid 0.375 Mg Xr12h-tab (Hyoscyamine sulfate) .Marland Kitchen... 1 tablet two times a day  Orders: Consultation Level V (94174)  Problem # 4:  HYPERCALCEMIA (ICD-275.42)  may be contributin to her recurrent stones  Orders: Consultation Level V (08144)  Problem # 5:  DYSURIA (ICD-788.1) Assessment: Improved  part of this is gettinh better with the pyridium and I gave her 30 days of this Her updated medication list for this problem includes:    Pyridium 100 Mg Tabs (Phenazopyridine hcl) .Marland Kitchen... 1 tablet every 6 hours prn    Invanz 1 Gm Solr (Ertapenem sodium) ..... Once daily for 2 weeks  Orders: Consultation Level V (81856)  Problem # 6:  DEGENERATIVE DISC DISEASE (ICD-722.6) I wonder if her lower back pain may be in part due to this and not recurrent UTIs. As above we will treat once for UTI but she and we  are going to have to learn to discern when she is having no inflammation and just asymptomatic bacteruria with chronic other complaints vs progressively symptomatic UTI that requires tratement Orders: Consultation Level V (31497)  Medications Added to Medication List This Visit: 1)  Pyridium 100 Mg Tabs (Phenazopyridine hcl) .Marland Kitchen.. 1 tablet every 6 hours prn 2)  Nitrostat 0.3 Mg Subl (Nitroglycerin) .Marland Kitchen.. 1 sublingual as needed 3)  Klor-con 10 10 Meq Cr-tabs (Potassium chloride) .Marland Kitchen.. 1 tablet daily 4)  Furosemide 40 Mg Tabs (Furosemide) .Marland Kitchen.. 1 tablet by mouth daily 5)  Levbid 0.375 Mg Xr12h-tab (Hyoscyamine sulfate) .Marland Kitchen..  1 tablet two times a day 6)  Amlodipine Besylate 10 Mg Tabs (Amlodipine besylate) .Marland Kitchen.. 1 tablet daily 7)  Tandem 162-115.2 Mg Caps (Ferrous fum-iron polysacch) .Marland Kitchen.. 1 capsule daily 8)  Invanz 1 Gm Solr (Ertapenem sodium) .... Once daily for 2 weeks  Patient Instructions: 1)  Fu appt with Dr. Daiva Eves in 4 weeks Prescriptions: INVANZ 1 GM SOLR (ERTAPENEM SODIUM) once daily for 2 weeks  #14 x 0   Entered and Authorized by:   Acey Lav MD   Signed by:   Paulette Blanch Dam MD on 06/18/2009  Method used:   Electronically to        [facility] (retail)       [address]       [place], [place]  [address]       Ph: [phone]       Fax: [phone]   RxID:   [id number] PYRIDIUM 100 MG TABS (PHENAZOPYRIDINE HCL) 1 tablet every 6 hours prn  #120 x 0   Entered and Authorized by:   Acey Lav MD   Signed by:   Paulette Blanch Dam MD on 06/18/2009   Method used:   Electronically to        [facility] [address] (retail)       [address]       [place], [place]  [address]       Ph: [phone]       Fax: [phone]   RxID:   [id number]  Process Orders Check Orders Results:     [facility]: Check successful Tests Sent for requisitioning (June 18, 2009 12:17 PM):     06/18/2009: [facility] -- T-Comprehensive Metabolic Panel [[id number]] (signed)     06/18/2009: [facility] -- T-CBC w/Diff [[id number]] (signed)     06/18/2009: [facility] -- T-Culture, Urine [[id number]] (signed)     06/18/2009: [facility] -- T-Urinalysis [[id number]] (signed)   Appended Document: 11:15 new pt multdrug urinary inf oxygen was 97 on 2 liters pulse 74  Starleen Arms, CMA 06/18/2009    Appended Document: 11:15 new pt multdrug urinary inf PIC line placement scheduled for 06/18/2009 in PM @ [facility].  [facility] Home [facility] to begin IV  ertapenem ASAP.  Orders faxed to [facility] along w/ OV notes and Summary Sheet.

## 2010-07-02 NOTE — Assessment & Plan Note (Signed)
Summary: resistent UTI/jc   Visit Type:  Follow-up Referring Provider:  brian cope, Primary Provider:  Mcgraff  CC:  resistent UTI.  History of Present Illness: 70 year old with multiple kidney stones,, recurrent UTIs , previously on macrobid. 2004 had kidneye stones with pyelonephritis. with colonization with ESBL She is currently ithout any symptoms of dysuria, She had a few days of discomfort that improved with pyridiumt. She has responded to IV carpabenem therpay in the past. Apparently she once again had ssx of burning, dysuria and was seen by Dr. Achilles Dunk who did urine culture wich grew Proteus senstive to TMP/SMX She has been on tmp/smx though her therpay was intially delayed.  She . Initially improved then felt poorly again. She now feels better in last 24 hours. I have worked her into clinic urgently> We have drawn urine culture and analysis repeat here. I hvae told her that if she has the ESBL again we will need to treat her with IV antibiotics again but we will hold off for now pending cultures and her clinical course.  Preventive Screening-Counseling & Management  Alcohol-Tobacco     Alcohol drinks/day: 0     Smoking Status: never  Caffeine-Diet-Exercise     Caffeine use/day: 0     Does Patient Exercise: no  Safety-Violence-Falls     Seat Belt Use: yes   Current Allergies (reviewed today): ! * TALOCID ! MACROBID ! CODEINE ! * OXYCONTIN Past History:  Past Medical History: Last updated: 06/18/2009 Allergic rhinitis Multiple kidney stones Recurrent UTIs COPD on home oxygen Cardiomyopathy CKD Atrophyic kidney GERD  Past Surgical History: Last updated: 07/19/2009 No recent surgerys She has had cystoscopies lumbar back surgery remote  Family History: Last updated: 06/18/2009 noncontributory  Social History: Last updated: 06/18/2009 former smoker, no alcohol, no recreational drugs  Risk Factors: Alcohol Use: 0 (01/30/2010) Caffeine Use: 0  (01/30/2010) Exercise: no (01/30/2010)  Risk Factors: Smoking Status: never (01/30/2010)  Review of Systems  The patient denies anorexia, fever, weight loss, weight gain, vision loss, decreased hearing, hoarseness, chest pain, syncope, dyspnea on exertion, peripheral edema, prolonged cough, headaches, hemoptysis, abdominal pain, melena, hematochezia, severe indigestion/heartburn, hematuria, incontinence, genital sores, muscle weakness, suspicious skin lesions, transient blindness, difficulty walking, depression, unusual weight change, abnormal bleeding, and enlarged lymph nodes.    Vital Signs:  Patient profile:   70 year old female Height:      65 inches (165.10 cm) Weight:      222.0 pounds (100.91 kg) BMI:     37.08 O2 Sat:      96 % on 2 L/min Temp:     98.0 degrees F (36.67 degrees C) oral Pulse rate:   91 / minute BP sitting:   138 / 79  (left arm)  Vitals Entered By: Baxter Hire) (January 30, 2010 4:09 PM)  O2 Flow:  2 L/min CC: resistent UTI Pain Assessment Patient in pain? yes     Location: lower back Intensity: 3 Type: aching Onset of pain  Constant Nutritional Status BMI of > 30 = obese Nutritional Status Detail appetite is alright per patient  Does patient need assistance? Functional Status Self care Ambulation Normal   Physical Exam  General:  alert, well-developed, well-nourished, and overweight-appearing.   Head:  normocephalic, atraumatic, and no abnormalities observed.   Eyes:  vision grossly intact, pupils equal, pupils round, and pupils reactive to light.   Ears:  no external deformities and ear piercing(s) noted.   Nose:  no external deformity and  no external erythema.   Mouth:  pharynx pink and moist, no erythema, and no exudates.   Neck:  supple and full ROM.   Lungs:  prolonged exp phase, no wheezes rhonchi or rales Heart:  normal rate, regular rhythm, no murmur, and no gallop.   Abdomen:  soft, normal bowel sounds, and no distention.   minimally tender to palpation in lower quadrants. Msk:  tenderness in lower back, not quite the CVA but lower Extremities:  1+ left pedal edema and 1+ right pedal edema.   Neurologic:  alert & oriented X3,  Skin:  erytthematous area along her waist line where she had been bitten by tick. She has no petechail or other rashes that I can find Psych:  Oriented X3, memory intact for recent and remote, and normally interactive.     Impression & Recommendations:  Problem # 1:  URINARY TRACT INFECTION, ACUTE, RECURRENT (ICD-599.0) I am worried taht her ESBL may be back post rx with TMP/SMX for Proteus. Will check ua, culture. I am holding off on putting in PICC line until either symptoms worsen,  or culture proves that this eSBL could be in play Her updated medication list for this problem includes:    Pyridium 100 Mg Tabs (Phenazopyridine hcl) .Marland Kitchen... 1 tablet every 6 hours prn    Doxycycline Hyclate 100 Mg Tabs (Doxycycline hyclate) .Marland Kitchen... Take 1 tablet by mouth two times a day for 10 days  Orders: T-Basic Metabolic Panel [id number]) T-CBC w/Diff [id number]) T-Urinalysis ([id number]) T-Culture, Urine ([id number]) Est. Patient Level IV (02725)  Problem # 2:  ESCHERICHIA COLI INFECTION IN CCE & UNS SITE (ICD-041.4)  see above  Orders: Est. Patient Level IV (36644)  Patient Instructions: 1)  we will watch your culture results 2)  if you feel abruptly worse let us know and come t othe ED 3)  You may need another picc line place d if this is the ESBL again

## 2010-07-02 NOTE — Progress Notes (Signed)
Summary: request med for yeast infection  Phone Note Call from Patient   Caller: Patient Call For: Paulette Blanch Dam MD Summary of Call: Patient has sxs of a yeast infection has been on abx for over 10 days and would like something called in to treat sxs. Was also hospitilized on Sunday and placed on more abx, she states she asked for medication for yeast infection but was not given a rx. Initial call taken by: Tamika Yarborough CMA,  February 05, 2010 9:50 AM  Follow-up for Phone Call        She can have flucnoazole 150mg x 1 Follow-up by: Cornelius Van Dam MD,  February 06, 2010 8:35 AM    New/Updated Medications: FLUCONAZOLE 150 MG TABS (FLUCONAZOLE) take once for yeast infeciton Prescriptions: FLUCONAZOLE 150 MG TABS (FLUCONAZOLE) take once for yeast infeciton  #1 x 5   Entered and Authorized by:   Cornelius Van Dam MD   Signed by:   Cornelius Van Dam MD on 02/06/2010   Method used:   Electronically to        [facility]  [address] (retail)       [address]       [place], [place]  [address]       Ph: [phone]       Fax: [phone]   RxID:   [id number]

## 2010-07-02 NOTE — Letter (Signed)
Summary: [facility] DEPT. OF RADIOLOGY  [facility] DEPT. OF RADIOLOGY   Imported By: Margie Billet 08/20/2009 10:12:56  _____________________________________________________________________  External Attachment:    Type:   Image     Comment:   External Document

## 2010-07-02 NOTE — Miscellaneous (Signed)
Summary: [facility]:Orders  [facility]:Orders   Imported By: Florinda Marker 06/27/2009 14:50:04  _____________________________________________________________________  External Attachment:    Type:   Image     Comment:   External Document

## 2010-07-02 NOTE — Progress Notes (Signed)
Summary: pt x ray results  Phone Note Call from Patient   Caller: Patient Call For: Wendy [name] Dam MD Summary of Call: Patient states that she is still in pain, the place that was imaged was not the area that she was hurting. I told her that her current x ray was normal and I would speak with Dr. Daiva Eves. Initial call taken by: Starleen Arms CMA,  September 04, 2009 4:27 PM  Follow-up for Phone Call        I re-ordered Xray of her upper arm. She can have it done here at [facility]. If she wants it done locally PCP needs to order it. THanks! Follow-up by: Acey Lav MD,  September 05, 2009 12:36 PM

## 2010-07-02 NOTE — Progress Notes (Signed)
Summary: UTI  Phone Note Outgoing Call   Call placed by: Annice Pih Summary of Call: Called pt. and she saw her PCP Dr. Achilles Dunk and she does have a UTI, pt. was told it was a protues bacteria and was resistent to most antibiotics.  Was put on Bactrim two times a day x 10 days.  And is suppose to have urine rechecked 02/11/10.  Dr. Achilles Dunk # [phone].  Pts. cell [phone].  Please advise Initial call taken by: Wendall Mola CMA Duncan Dull),  January 23, 2010 10:20 AM  Follow-up for Phone Call        Can we get the culture results faxed to [place]. Follow-up by: Acey Lav MD,  January 23, 2010 10:44 AM

## 2010-07-02 NOTE — Progress Notes (Signed)
Summary: c/o antibiotic not working  Phone Note Call from Patient   Caller: Patient Summary of Call: Pt. called c/o the Bactrim not working. Dr. Achilles Dunk is on vaction.  She is having extreme burning, lower abdomen, low back pain, and frequent urination.   Pt. would like a followup here with and ID Dr. Dr. Algis Liming doe not have opening until early Oct., Dr. Orvan Falconer has something this Thurs.  Pt. does not want to go to ED, appt. sched. with Dr. Orvan Falconer Initial call taken by: Wendall Mola CMA Duncan Dull),  January 29, 2010 10:57 AM  Follow-up for Phone Call        Annice Pih I could also have worked her into early afternoon clinic tomorrow. We can touch base with her in am and see if she wants to come then. JUst to let everyone know UAs will need to be ordered stat because they otherwise may not be back until >12 hrs Follow-up by: Acey Lav MD,  January 29, 2010 9:36 PM

## 2010-07-02 NOTE — Progress Notes (Signed)
Summary: Care Plan Oversight  Phone Note Outgoing Call   Call placed by: Acey Lav MD,  July 06, 2009 9:03 AM Details for Reason: Care Plan Oversight Summary of Call: [phone] (30 or more mins)  I have supervised home care and/or infusion therapy for this pt, including providing orders for care, review of labs and/or home health care plans, communicating with the home health care professionals and/or patient/caregivers to integrate current information into the medical treatment plan and/or adjust the medical therapy. This supervision has been provided for __32_minutes during the calendar month. Dates for this oversight : 06/18/09 thru 2/4/11_.

## 2010-07-02 NOTE — Progress Notes (Signed)
Summary: questions about Doxycycline-TY  Phone Note Call from Patient   Caller: Patient Call For: Paulette Blanch Dam MD Summary of Call: Patient called wanting to know if Dr. Daiva Eves wanted her to stay on doxycycline, her last dose of iv abx was on 07/02/2009 and picc was pulled yesterday. Her urologist originally placed her on doxycycline before she started coming to the ID clinic.  Initial call taken by: Starleen Arms CMA,  July 05, 2009 10:37 AM  Follow-up for Phone Call        SHE SHOULD NOT RESTART THE DOXCYCLINE. I WAS very explicit about this to her and it is in my note.  Follow-up by: Acey Lav MD,  July 05, 2009 12:35 PM

## 2010-07-02 NOTE — Miscellaneous (Signed)
Summary: HIPAA Restrictions  HIPAA Restrictions   Imported By: Florinda Marker 06/18/2009 14:53:58  _____________________________________________________________________  External Attachment:    Type:   Image     Comment:   External Document

## 2010-07-02 NOTE — Progress Notes (Signed)
Summary: MRI results available, pt wanting to know results  Phone Note Call from Patient   Caller: Patient Call For: Acey Lav MD Reason for Call: Lab or Test Results Summary of Call: Pt. wanting to know MRI results from [facility].  Results placed in MD box from [facility]. Please advise,  Jennet Maduro RN  August 01, 2009 12:13 PM   Caller: Medical Records Call For: Dr. Daiva Eves Summary of Call: [facility] faxed MRI results.  Pt wanting to know the results.     Follow-up for Phone Call        MRI showed diffuse disk osteophyte complexes with moderate foraminal narrowing at C6-7, and moderate canal narrowing at C4-5. She has no evdence of infection. This will need to be taken into account should she develop neurological symptoms. I left her voice mail. WIll forward to her PCP.  Follow-up by: Acey Lav MD,  August 02, 2009 4:47 PM

## 2010-07-02 NOTE — Assessment & Plan Note (Signed)
Summary: F/U/VS   Referring Provider:  brian cope,  CC:  neck pain, 2 week f/u, and started taking new sulfur drug smz/TMP DS 800-160.  History of Present Illness: 70 year old with multiple kidney stones,, recurrent UTIs , previously on macrobid. 2004 had kidneye stones with pyelonephritis. She had macrobid kept things under control for quite some time, but was taken off of this becasue it "caysed liver damage" She has since been put on chronic  doxycylne. She has been treate for UTIs nearly every month and often every two weeks. She was formerly being followed by Orson Aloe in [place] for this along with her urologist Dr. Achilles Dunk. I was called in early January because she was likely growing an ESBL from her urine and she could no longer get IV abx from Dr. Blockers office due to insurance change. I advised Dr. Wynn Maudlin office at that time that if the pt was suffiicently ill to admit her to the hospital for IV antibiotics. This never happened however. In the interim she has continued to have burning that she has nearly always (the only exception being for 18 days post rx with invanz this summer), I saw her about a month ago and eventually decided to give her a course of INVanz for the ESBL that she invariaby grew from her urine but which I was not terribly convinced was a true pathogen when I saw her. She did improve symptomatically while on invanz. She returend to see her urologist for routine followup. She told him she was having a tiny bit of "burning." She was having increased urination as well. Her urine was analyzed and  She was given bactrim. She still has a little bit of burning. No fevers, no chills,. no burning as when she has UTI or chils and back pain which she wil commonly get. She is having NEW  neck pain that began about a week ago and radiates into her shoulders, rates at 6-7/10 and sharp. McGraff is PCP.   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 One Puff Two Times A Day - Rinse Mouth Well After Use 2)  Furosemide 40 Mg  Tabs (Furosemide) .... Once Daily 3)  Levothyroxine Sodium 50 Mcg  Tabs (Levothyroxine Sodium) .... One Per Day 4)  Klor-Con 10 10 Meq  Tbcr (Potassium Chloride) 5)  Prilosec 40 Mg  Cpdr (Omeprazole) .... Once Daily 6)  Albuterol Sulfate (2.5 Mg/74ml) 0.083%  Nebu (Albuterol Sulfate) .... For As Needed in Nebulizer 7)  Pyridium 100 Mg Tabs (Phenazopyridine Hcl) .Marland Kitchen.. 1 Tablet Every 6 Hours Prn 8)  Nitrostat 0.3 Mg Subl (Nitroglycerin) .Marland Kitchen.. 1 Sublingual As Needed 9)  Klor-Con 10 10 Meq Cr-Tabs (Potassium Chloride) .Marland Kitchen.. 1 Tablet Daily 10)  Furosemide 40 Mg Tabs (Furosemide) .Marland Kitchen.. 1 Tablet By Mouth Daily 11)  Levbid 0.375 Mg Xr12h-Tab (Hyoscyamine Sulfate) .Marland Kitchen.. 1 Tablet Two Times A Day 12)  Amlodipine Besylate 10 Mg Tabs (Amlodipine Besylate) .Marland Kitchen.. 1 Tablet Daily 13)  Tandem 162-115.2 Mg Caps (Ferrous Fum-Iron Polysacch) .Marland Kitchen.. 1 Capsule Daily  14)  Invanz 1 Gm Solr (Ertapenem Sodium) .... Once Daily For 2 Weeks  Current Medications (verified): 1)  Advair Diskus 250-50 Mcg/dose  Misc (Fluticasone-Salmeterol) .... One Puff Two Times A Day - Rinse Mouth Well After Use 2)  Furosemide 40 Mg  Tabs (Furosemide) .... Once Daily 3)  Levothyroxine Sodium 50 Mcg  Tabs (Levothyroxine Sodium) .... One Per Day 4)  Klor-Con 10 10 Meq  Tbcr (Potassium Chloride) 5)  Prilosec 40 Mg  Cpdr (Omeprazole) .... Once Daily 6)  Albuterol Sulfate (2.5 Mg/105ml) 0.083%  Nebu (Albuterol  Sulfate) .... For As Needed in Nebulizer 7)  Pyridium 100 Mg Tabs (Phenazopyridine Hcl) .Marland Kitchen.. 1 Tablet Every 6 Hours Prn 8)  Nitrostat 0.3 Mg Subl (Nitroglycerin) .Marland Kitchen.. 1 Sublingual As Needed 9)  Klor-Con 10 10 Meq Cr-Tabs (Potassium Chloride) .Marland Kitchen.. 1 Tablet Daily 10)  Furosemide 40 Mg Tabs (Furosemide) .Marland Kitchen.. 1 Tablet By Mouth Daily 11)  Levbid 0.375 Mg Xr12h-Tab (Hyoscyamine Sulfate) .Marland Kitchen.. 1 Tablet Two Times A Day 12)  Amlodipine Besylate 10 Mg Tabs (Amlodipine Besylate) .Marland Kitchen.. 1 Tablet Daily 13)  Tandem 162-115.2 Mg Caps (Ferrous Fum-Iron Polysacch) .Marland Kitchen.. 1 Capsule Daily  Allergies (verified): 1)  ! * Talocid 2)  ! Macrobid    Current Allergies (reviewed today): ! * TALOCID ! MACROBID Past History:  Past Medical History: Last updated: 06/18/2009 Allergic rhinitis Multiple kidney stones Recurrent UTIs COPD on home oxygen Cardiomyopathy CKD Atrophyic kidney GERD  Family History: Last updated: 06/18/2009 noncontributory  Social History: Last updated: 06/18/2009 former smoker, no alcohol, no recreational drugs  Risk Factors: Smoking Status: never (06/18/2009)  Past Surgical History: No recent surgerys She has had cystoscopies lumbar back surgery remote  Review of Systems  The patient denies anorexia, fever, weight loss, weight gain, vision loss, decreased hearing, hoarseness, chest pain, syncope, dyspnea on exertion, peripheral edema, prolonged cough, headaches, hemoptysis, abdominal pain, melena, hematochezia, severe indigestion/heartburn, hematuria, incontinence, genital sores, muscle weakness, suspicious skin lesions, transient blindness, difficulty walking, depression, unusual weight change, abnormal bleeding, and enlarged lymph nodes.    Vital Signs:  Patient profile:   70 year old female Height:      65 inches (165.10 cm) Weight:      230.50 pounds (104.77 kg) BMI:     38.50 Temp:     96.8 degrees F (36 degrees C) oral Pulse rate:   69 / minute BP sitting:   140  / 74  (left arm)  Vitals Entered By: Starleen Arms CMA (July 19, 2009 3:23 PM) CC: neck pain, 2 week f/u, started taking new sulfur drug smz/TMP DS 800-160 Is Patient Diabetic? No Pain Assessment Patient in pain? yes     Location: neck Intensity: 3 Type: aching Nutritional Status BMI of > 30 = obese Nutritional Status Detail nl  Does patient need assistance? Functional Status Self care Ambulation Normal, Impaired:Risk for fall Comments walks with walker   Physical Exam  General:  alert, well-developed, well-nourished, and overweight-appearing.   Head:  normocephalic, atraumatic, and no abnormalities observed.   Eyes:  vision grossly intact, pupils equal, pupils round, and pupils reactive to light.   Ears:  no external deformities and ear piercing(s) noted.   Nose:  no external deformity and no external erythema.   Mouth:  pharynx pink and moist, no erythema, and no exudates.   Neck:  supple and full ROM.   Lungs:  prolonged exp phase, no wheezes rhonchi or rales Heart:  normal rate, regular rhythm, no murmur, and no gallop.  Abdomen:  soft, normal bowel sounds, and no distention.  minimal suprapubic tenderness Msk:  tenderness in lower back, not quite the CVA but lower Extremities:  1+ left pedal edema and 1+ right pedal edema.   Neurologic:  alert & oriented X3, cranial nerves II-XII intact, and strength normal in all extremities.  walks with waker Skin:  no rashes.  pale Psych:  Oriented X3, memory intact for recent and remote, and normally interactive.     Impression & Recommendations:  Problem # 1:  URINARY TRACT INFECTION, ACUTE, RECURRENT (ICD-599.0) I really dont think she has much evidence for a tuTI at present given her symptoms are highly consisten with her basline ssx and she will invariablygrow bacteria from her urine if it is cultured> I have asked her to stop eht bactrim as it only likely to select for the ESBL that colonizes her urine. Again the main  issue with her is going to be watchful waiting and treatment only when her symptoms are HIGHLY suggestive. I will check a ua (which was negative) and a culture, thought I do not plan to acti on them. I am more curious as to whether she continues to be colonized with her ESBL. Stone burden is a big issue for her as well for her chroinic persistent bacgteriruia and recurrent UTIs. She is being followed closely by her Urologist. The following medications were removed from the medication list:    Invanz 1 Gm Solr (Ertapenem sodium) ..... Once daily for 2 weeks Her updated medication list for this problem includes:    Pyridium 100 Mg Tabs (Phenazopyridine hcl) .Marland Kitchen... 1 tablet every 6 hours prn  Orders: Est. Patient Level IV (16109)  Problem # 2:  ESCHERICHIA COLI INFECTION IN CCE & UNS SITE (ICD-041.4) see above discussion, she is colonzed with ESBL that from time to time causes frank UTI and at times severe UTI. Orders: Est. Patient Level IV (60454)  Problem # 3:  INF MICROORG RESIST-OTH SPEC RX NO RESIST MX RX (ICD-V09.80)  Orders:again this is multi drug resistant pathogen Est. Patient Level IV ([address]11)  Problem # 4:  NECK PAIN, ACUTE (ICD-723.1) I am getting an MRI of her neck. Given her hx of recurrent and at times severe UTIs, prior surgery on L spine as well ther eis concern taht she could at some pt in time ahve become bacteremic and seeded spine, though my suspcion is not HIGH for this. Orders: MRI with Contrast (MRI w/Contrast) Est. 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 The following medications were removed from the medication list:    Invanz 1 Gm Solr (Ertapenem sodium) ..... Once daily for 2 weeks Her updated medication list for this problem includes:    Pyridium 100 Mg Tabs (Phenazopyridine hcl) .Marland Kitchen... 1 tablet every 6 hours  prn  Orders: T-Culture, Urine ([id number]) T-Basic Metabolic Panel [id number]) T-CBC w/Diff ([id number]) T-Urinalysis ([id number])  Patient Instructions: 1)  Stop the bactrim 2)  We need to be very careful about antibiotics with you so that we do not select for resistant organisms 3)  We will check an mri of her your neck to make sure that that area is OK Process Orders Check Orders Results:     [facility]: Check successful Tests Sent for requisitioning (July 20, 2009 6:46 PM):     07/19/2009: [facility] -- T-Culture, Urine [[id number]] (  signed)     07/19/2009: [facility] -- T-Basic Metabolic Panel [id number] (signed)     07/19/2009: [facility] -- T-CBC w/Diff [[id number]] (signed)     07/19/2009: [facility] -- T-C-Reactive Protein [id number] (signed)     07/19/2009: [facility] -- T-Sed Rate (Automated) [phone] (signed)     07/19/2009: [facility] -- T-Urinalysis [[id number]] (signed)

## 2010-07-05 DIAGNOSIS — G4733 Obstructive sleep apnea (adult) (pediatric): Secondary | ICD-10-CM | POA: Diagnosis present

## 2010-07-10 ENCOUNTER — Ambulatory Visit: Payer: Self-pay | Admitting: Family

## 2010-08-02 IMAGING — CR DG ABDOMEN 1V
1 series · 2 of 2 positions shown · non-contrast
Comparison: none

REASON FOR EXAM: nephrolithiasis
COMMENTS:

[Series 1: view not recorded · 0.17mm/px · 2 of 2 slices shown]
[im 1/2]
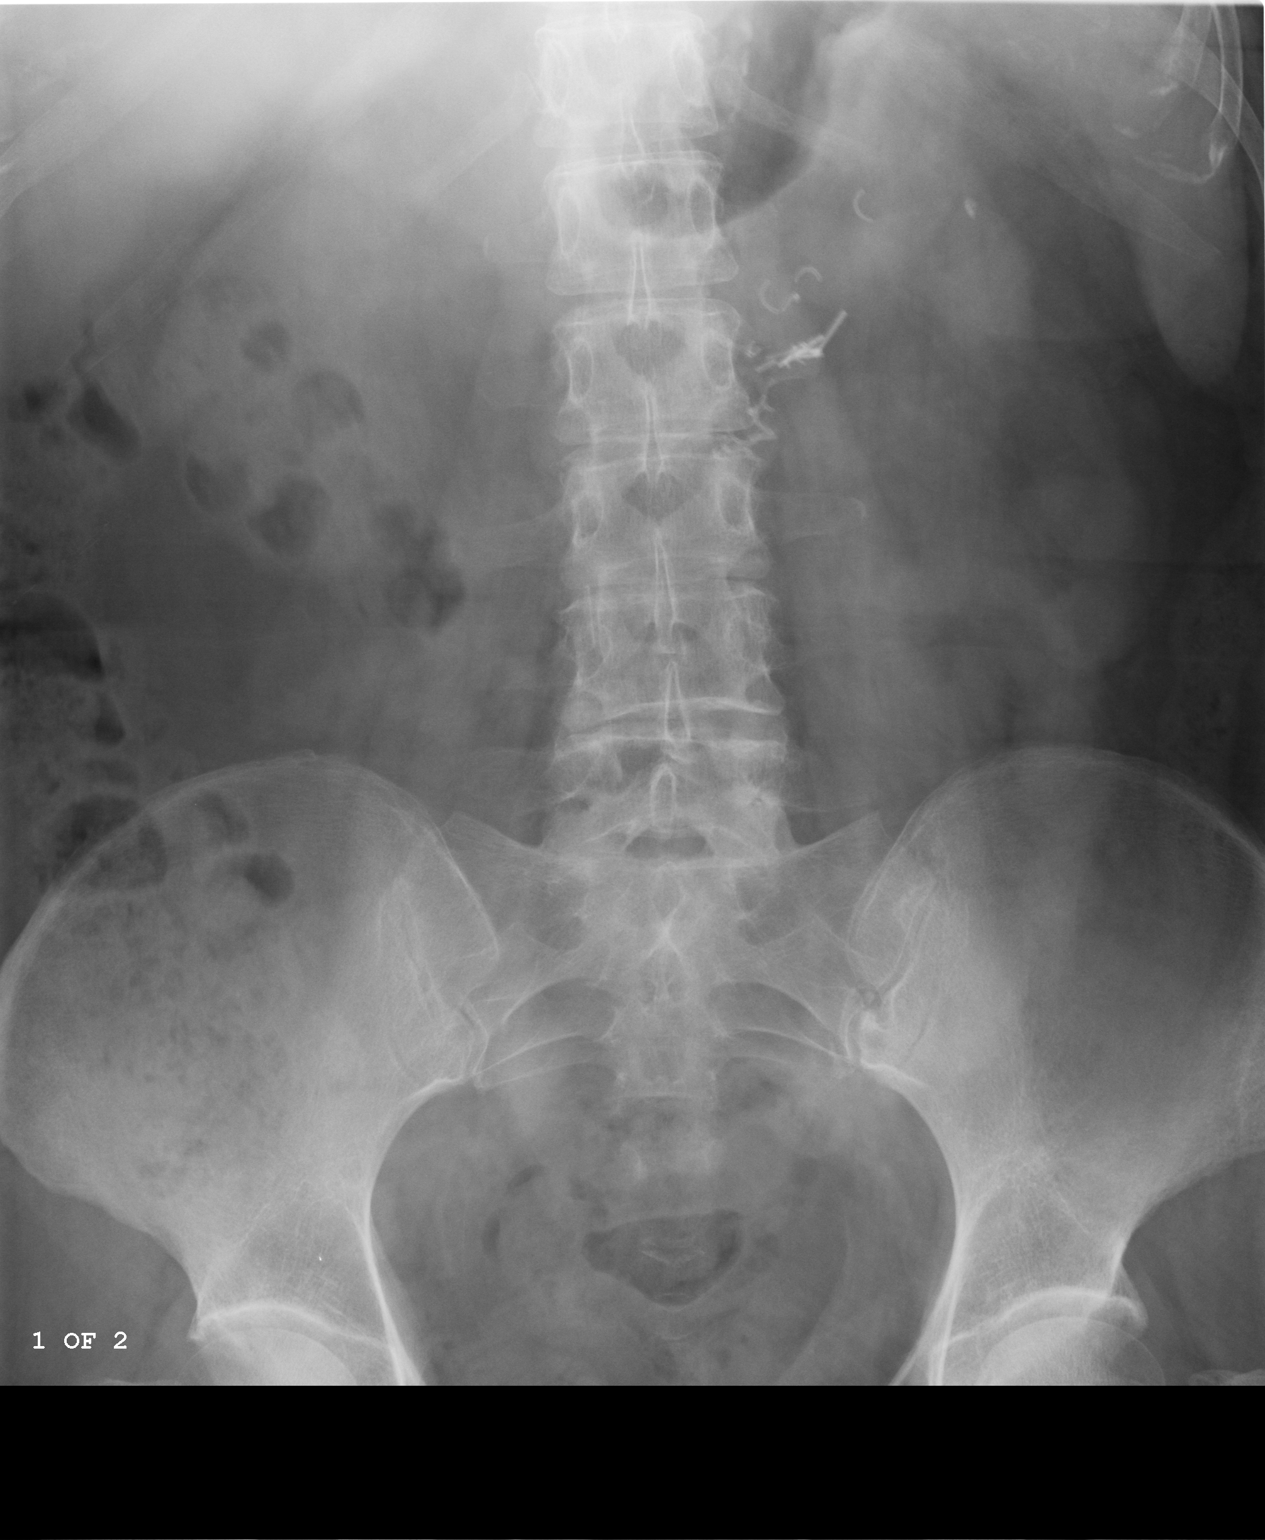
[im 2/2]
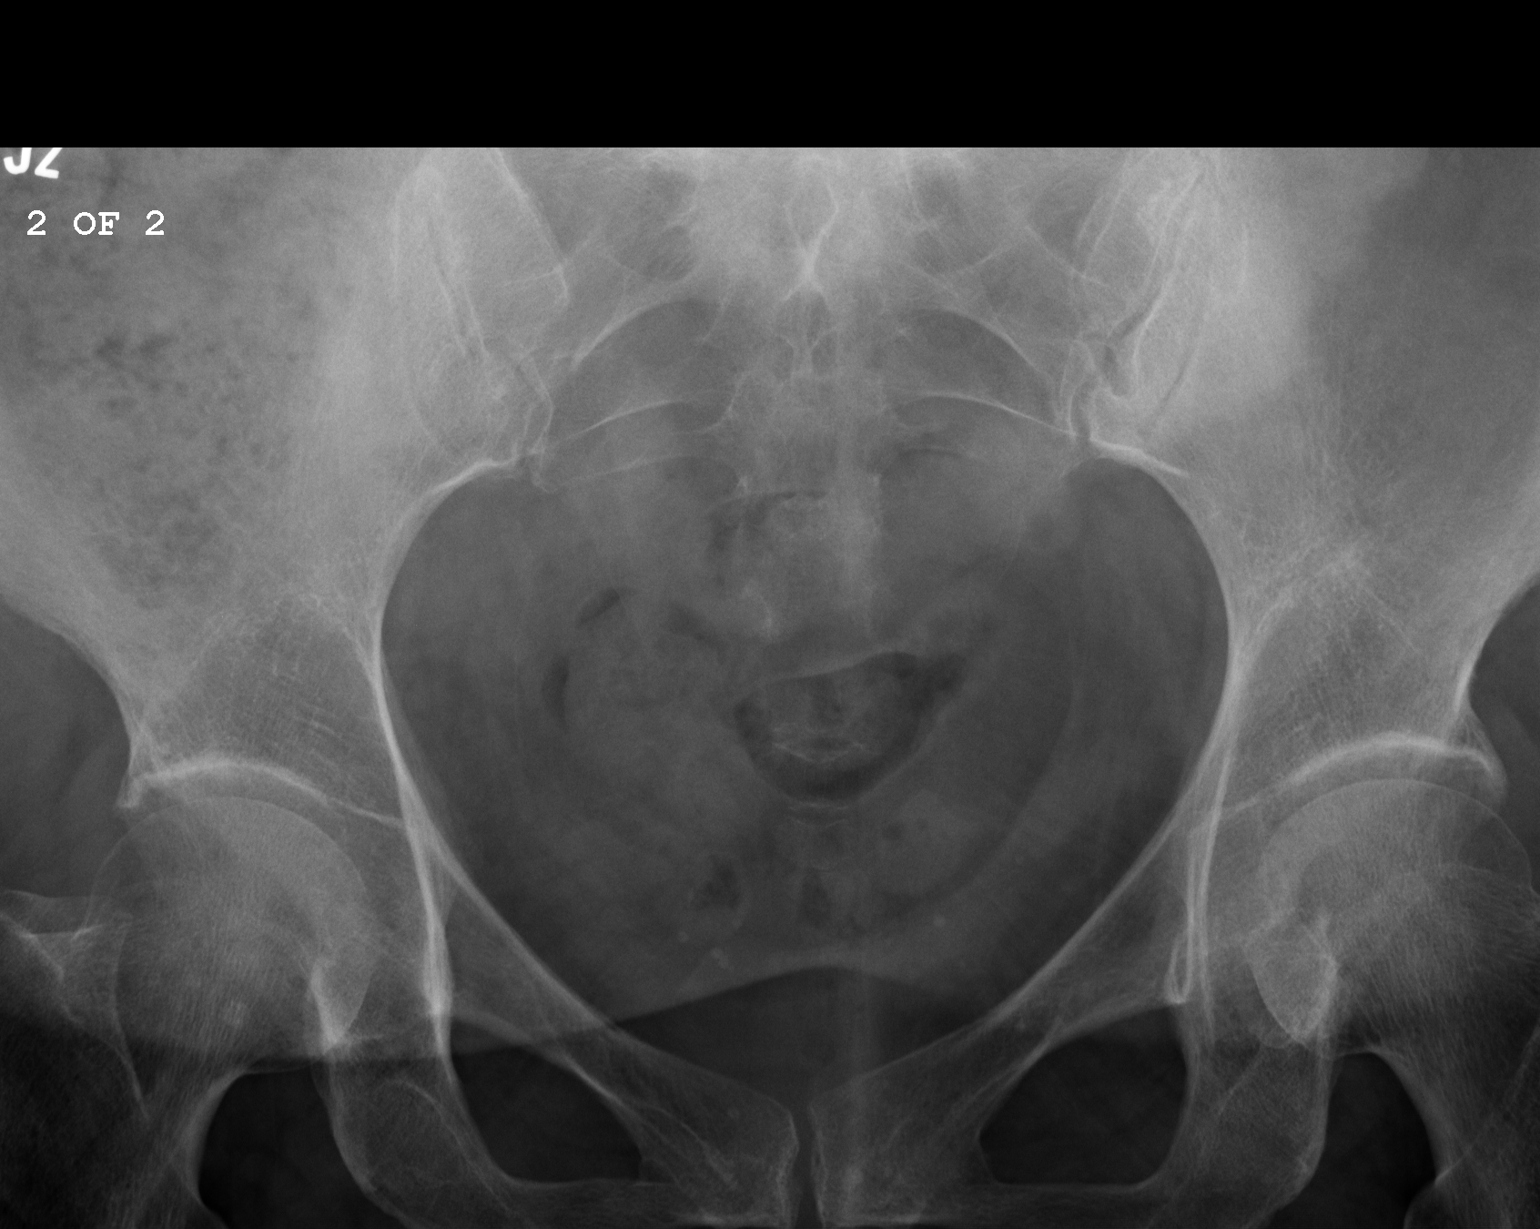

[2 of 2 positions shown; findings below may reference images not displayed]

PROCEDURE:     DXR - DXR KIDNEY URETER BLADDER  - July 18, 2009  [DATE]

RESULT:     An AP view of the abdomen is obtained and compared to the prior
exam of 02/23/2009.

The previously noted calcification projected over the lateral aspect of the
left kidney is again seen and unchanged in position. No additional renal or
ureteral calcifications are identified. Post operative metallic clips are
noted in the left upper quadrant.
IMPRESSION: 1.  There is a calcific density projected peripherally over the midpole
region of the left kidney and appears unchanged as compared to the prior
exam.
2.  A few phleboliths are noted in the pelvis.
3.  No definite ureteral calcifications are identified.

## 2010-08-06 ENCOUNTER — Encounter: Payer: Self-pay | Admitting: Pulmonary Disease

## 2010-08-12 ENCOUNTER — Encounter: Payer: Self-pay | Admitting: *Deleted

## 2010-08-15 LAB — CBC
HCT: 35 % — ABNORMAL LOW (ref 36.0–46.0)
Hemoglobin: 11.6 g/dL — ABNORMAL LOW (ref 12.0–15.0)
Hemoglobin: 12 g/dL (ref 12.0–15.0)
MCH: 32.1 pg (ref 26.0–34.0)
MCV: 95.5 fL (ref 78.0–100.0)
RBC: 3.74 MIL/uL — ABNORMAL LOW (ref 3.87–5.11)
RDW: 12.8 % (ref 11.5–15.5)
WBC: 5.5 10*3/uL (ref 4.0–10.5)

## 2010-08-15 LAB — COMPREHENSIVE METABOLIC PANEL
ALT: 23 U/L (ref 0–35)
AST: 29 U/L (ref 0–37)
Alkaline Phosphatase: 70 U/L (ref 39–117)
GFR calc Af Amer: >60 mL/min (ref >60)
Glucose, Bld: 97 mg/dL (ref 70–99)
Potassium: 3.8 mEq/L (ref 3.5–5.1)
Sodium: 141 mEq/L (ref 135–145)
Total Protein: 6.8 g/dL (ref 6.0–8.3)

## 2010-08-15 LAB — BASIC METABOLIC PANEL
CO2: 24 mEq/L (ref 19–32)
Chloride: 108 mEq/L (ref 96–112)
GFR calc Af Amer: 54 mL/min — ABNORMAL LOW (ref >60)
Sodium: 138 mEq/L (ref 135–145)

## 2010-08-15 LAB — GLUCOSE, CAPILLARY
Glucose-Capillary: 117 mg/dL — ABNORMAL HIGH (ref 70–99)
Glucose-Capillary: 99 mg/dL (ref 70–99)

## 2010-08-15 LAB — MRSA PCR SCREENING: MRSA by PCR: NEGATIVE

## 2010-09-01 ENCOUNTER — Encounter: Payer: Self-pay | Admitting: Pulmonary Disease

## 2010-09-17 ENCOUNTER — Ambulatory Visit: Payer: Self-pay | Admitting: Gastroenterology

## 2010-09-18 LAB — PATHOLOGY REPORT

## 2010-09-19 ENCOUNTER — Ambulatory Visit: Payer: Self-pay | Admitting: Gastroenterology

## 2010-10-01 ENCOUNTER — Encounter: Payer: Self-pay | Admitting: Pulmonary Disease

## 2010-10-15 NOTE — Assessment & Plan Note (Signed)
[facility]                             PULMONARY OFFICE NOTE   NAME:[name], Wendy                       MRN:          [id number]  DATE:04/22/2007                            DOB:          11-19-1940    PROBLEM LIST:  1. Asthmatic bronchitis.  2. Hypertrophic cardiomyopathy.  3. Esophageal reflux.  4. Allergic rhinitis.  5. Degenerative disk disease.  6. Hypercalcemia.   HISTORY:  Complains of feeling drained and shortness of breath comes  and goes since September.  She was anemic in April and needed  transfusion.  Still complains of soreness in the mid sternal area, which  may be somewhat exertional, and also comes and goes.  She is pending  heart catheterization tomorrow.  Chest x-ray from November 7 showed no  acute findings, large hiatal hernia, chronic interstitial changes with  some elevation of the right hemidiaphragm.  Blood work from November 7  included normal  WBC at 6000 and normal hemoglobin at 14.1.  Renal  function was normal.  B-natriuretic peptide 40.  Pulmonary function  tests from October 20 had shown mild restriction of total lung capacity,  mild obstructive airways disease, particularly in small airways with  insignificant response to bronchodilator, and severe reduction of  diffusion at 41% of predicted.  A CT scan of the chest on September 11  done at [facility] showed no pulmonary embolism.  There was some  evidence of COPD with some atelectasis.  Borderline enlarged lymph  nodes, not changed from a year before.   OBJECTIVE:  Weight 220 pounds, BP 130/74, pulse 104, room air saturation  97%.  Mildly anxious, unlabored.  There were crackles posteriorly in the lung  bases.  Heart sounds were regular without murmur.  No neck vein distension or stridor.  No cough or wheeze.  No peripheral edema.   IMPRESSION:  Decreased diffusion capacity may be on a cardiac and  abdominal obesity basis.  Substernal pain should be  evaluated for angina  first, and I am glad she has her heart catheterization tomorrow.  If the  heart is negative, then symptoms would fit quite well with esophageal  irritation related to her hiatal hernia.  She is moderately overweight,  and this contributes to the atelectasis and her general lack of energy.   PLAN:  Consider whether barium swallow or NPSG would have any value  based on her questions.  She will return in about 3 months and follow up  depending on how her heart catheterization does.     Clinton D. Maple Hudson, MD, Tonny Bollman, FACP  Electronically Signed    CDY/MedQ  DD: 04/22/2007  DT: 04/23/2007  Job #: [id number]   cc:   Dr. Pleas Patricia

## 2010-10-15 NOTE — Op Note (Signed)
NAME:  Wendy [name], Wendy [name] NO.:  [id number]   MEDICAL RECORD NO.:  [id number]          PATIENT TYPE:  INP   LOCATION:  5156                         FACILITY:  [facility]   PHYSICIAN:  Currie Paris, M.D.DATE OF BIRTH:  [birth year]   DATE OF PROCEDURE:  10/22/2007  DATE OF DISCHARGE:                               OPERATIVE REPORT   PREOPERATIVE DIAGNOSIS:  Carcinoma right breast, central, clinical stage  I.   POSTOPERATIVE DIAGNOSIS:  Carcinoma right breast, central, clinical  stage I.   OPERATION PERFORMED:  Right total mastectomy with blue dye injection and  axillary sentinel lymph node biopsy (3 nodes).   SURGEON:  Currie Paris, MD   ASSISTANT:  Adolph Pollack, MD   ANESTHESIA:  General (LMA).   CLINICAL HISTORY:  This is a 70 year old lady recently found to have a  centrally located right breast cancer which appeared to have some DCIS  but also an invasive component.  After lengthy discussion of her  situation and review of her medical condition, we elected to proceed to  mastectomy.   DESCRIPTION OF PROCEDURE:  The patient was seen in the holding area and  had no further questions.  I initialed the right breast as the operative  side.  She had already had the right breast injected with radioactive  isotope.  She had no further questions.   The patient taken to the operating room after satisfactory general  anesthesia had been obtained.  The time-out was done.  The breast on the  right was then injected with 5 mL of dilute methylene blue which was  massaged in.   The entire breast was prepped and draped.  I outlined an elliptical  incision taking a large amount of skin since she has fairly large  breasts, so I want to make sure we had a smooth closure to the extent  possible.  The skin flap was raised superiorly to the clavicle, medially  to the sternum, and then out into the axilla.  Using Neoprobe, I  identified a hot area, was able to  dissect through here and identified 3  hot blue lymph nodes all of which were removed.  The highest count was  1350.  Once these 3 were out, there were no counts above 15 or 20 in any  place in the axilla and no palpable adenopathy.  No other blue dye was  noted either.   Attention was turned back to the breast.  I completed the skin flap  making sure everything appeared appropriate and then made the inferior  incision and raised the inferior flap medially to the sternum, laterally  to the latissimus, and inferiorly to the rectus sheath.  The breast was  then removed from medial to lateral taking the fascia.  This was all  done with cautery.  When I got to the edge of the pectoralis, the  clavipectoral fascia was opened and the inferior attachments to the  serratus divided and the inferior attachments to the anterior edge of  the latissimus divided.  At this point, Dr. Berneta Levins called from  pathology to report all 3 lymph nodes were negative.  I therefore  disconnected the breast tissue as it was entering into the axilla  without taking any further axillary tissue.   I placed two 19 Blake drains.  I irrigated and made sure everything was  dry.  It appeared we had a large amount of excess axillary skin, so I  trimmed some of that off to see the skin a little bit better closer  there.  The skin was then closed with staples.  The drains were charged  and we appeared to have everything dry.   Sterile dressings were applied.  The patient tolerated the procedure  well.  There were no operative complications.  Estimated blood loss was  less than 100 mL.      Currie Paris, M.D.  Electronically Signed     CJS/MEDQ  D:  10/22/2007  T:  10/23/2007  Job:  [id number]   cc:   Noah Delaine, M.D.

## 2010-10-15 NOTE — Assessment & Plan Note (Signed)
[facility]                             PULMONARY OFFICE NOTE   NAME:[name], Wendy                       MRN:          [id number]  DATE:04/09/2007                            DOB:          10-Sep-1940    HISTORY OF PRESENT ILLNESS:  Patient is a 70 year old white female  patient of Dr. Roxy Cedar, who has a known history of asthmatic bronchitis,  allergic rhinitis, hypertrophic cardiomyopathy, who presents today for  an acute work-in visit.  The patient complains that she continues to  have persistent, mainly productive cough, congestion, and shortness of  breath with activity.  Patient reports that over the last five years,  she has had multiple episodes of intermittent chest pain, jaw pain,  shortness of breath.  She has been seen by cardiology previously and  reports she has had a stress test.  Unfortunately, those records are not  available to me at our office today.  She is followed at Dr. Gwen Pounds at  the [facility] in [place].  She has had some difficulties with  congestive heart failure in the past.  The patient denies any orthopnea,  PND, abdominal pain, nausea, vomiting, calf pain, or increased swelling.   The patient reports symptoms have worsened with her cough, congestion,  and shortness of breath over the last three months.  She had an episode  of bronchitis three months ago and was treated with antibiotics and  prednisone.  The symptoms have improved but have not totally resolved.  She has not returned back to her baseline.  Pulmonary function studies  on March 22, 2007 revealed an FEV1 of 2.01 liters, which is 80% of the  predicted.   A CT scan on September 11th was negative for pulmonary embolism.  Shows  some evidence of COPD with patchy areas of subpleural increased density  compatible with atelectasis versus acute bronchitis and no change in  mediastinal or hilar lymph nodes.  The patient was recently changed off  of her  ACE inhibitor over to amlodipine due to possibly ACE-induced  cough.  The patient did not see any significant change in symptoms.   PAST MEDICAL HISTORY:  Reviewed.   CURRENT MEDICATIONS:  Reviewed.   PHYSICAL EXAMINATION:  Patient is a somewhat anxious female in no acute  distress.  She is afebrile with stable vital signs.  O2 saturation is 96% on room  air.  HEENT:  Posterior pharynx is clear.  Nontender sinuses.  Marland Kitchen  NECK:  Supple without cervical adenopathy.  No JVD.  LUNGS:  The lung sounds reveal diminished breath sounds in the bases  without any wheezing or crackles.  CARDIAC:  Regular rate.  ABDOMEN:  Soft, nontender.  No palpable hepatosplenomegaly.  EXTREMITIES:  Warm without any calf cyanosis, clubbing, or edema.   DATA:  Chest x-ray reveals no acute findings with a notable hiatal  hernia.   IMPRESSION/PLAN:  1. Recurrent asthmatic bronchitis and allergic rhinitis with recent      slow-to-resolve exacerbation.  Patient's symptoms have been waxing      and waning over the last three months.  I have recommended that      patient begin Mucinex DM twice daily, increase Prilosec up to twice      daily over the next two weeks for any residual reflux that could be      irritating the airways.  Lab work, including a BMET, CBC, TSH, BNP,      and sed rate are all pending at the time of the dictation.  Will      follow up accordingly.  Patient will follow back up with Dr. Maple Hudson      in two weeks or sooner as needed.  2. Chronic history of pleuritic chest pain over the last five years      with shortness of breath with activity.  Patient does have a      followup with her cardiologist in the next several weeks.  We will      call over to their office and see if we can get her seen early next      week.  Patient is advised that if symptoms worsen, she is to      contact Dr. Gwen Pounds or our office for sooner followup or seek      emergency room attention.      Rubye Oaks,  NP  Electronically Signed      Clinton D. Maple Hudson, MD, Tonny Bollman, FACP  Electronically Signed   TP/MedQ  DD: 04/09/2007  DT: 04/10/2007  Job #: [id number]

## 2010-10-15 NOTE — Discharge Summary (Signed)
NAME:  Wendy [name], Wendy [name] NO.:  [id number]   MEDICAL RECORD NO.:  [id number]          PATIENT TYPE:  INP   LOCATION:  5156                         FACILITY:  [facility]   PHYSICIAN:  Currie Paris, M.D.DATE OF BIRTH:  1940-07-20   DATE OF ADMISSION:  10/22/2007  DATE OF DISCHARGE:  10/24/2007                               DISCHARGE SUMMARY   FINAL DIAGNOSES:  1. Right breast cancer, clinical stage I (T1b, N0, estrogen receptor      positive, progesterone receptor positive, and Her-2/neu negative).  2. Chronic obstructive pulmonary disease, severe.  3. Sleep apnea.   HISTORY:  [name] is a 70 year old lady who recently found to have  a small right breast cancer, centrally located.  After discussion of  alternatives and preoperative medical evaluation, she elected to have a  mastectomy with no reconstruction.   HOSPITAL COURSE:  The patient was taken to the operating room on Oct 22, 2007, and underwent right mastectomy with blue dye injection and  axillary sentinel lymph node biopsy.  She tolerated the procedure well.  She has been seen __________ in the hospital, recovering from surgery,  and on Oct 24, 2007, was feeling better and the wound looked okay.  JPs  were working and she was able to be discharged.  She was evaluated with  JP management.   DATA OBTAINED:  Her pathology report showed an invasive ductal carcinoma  of 0.6 cm,__________VR 1/3 with some low-grade DCIS.  The margins were  negative.  Three lymph nodes were negative for metastatic disease.  The  cancer was estrogen receptor positive at 73%, progesterone receptor  positive at 15%, KI 67% and 5%, and Her-2/neu (HercepTest) 0, negative.   OTHER LABORATORY STUDIES:  Preoperative hemoglobin of 10.8 with a white  count of 6.6.  The electrolytes were noted to be normal.  Urinalysis was  negative.  She did have a slight elevation of her AST at 164, ALT of  112, and ALP of 142.   The patient  will be followed as an outpatient in our office and also set  for Medicine and Oncology consultation.      Currie Paris, M.D.  Electronically Signed     CJS/MEDQ  D:  12/22/2007  T:  12/23/2007  Job:  [id number]

## 2010-10-15 NOTE — Assessment & Plan Note (Signed)
[facility]                             PULMONARY OFFICE NOTE   NAME:Burgher, Makahla                       MRN:          [id number]  DATE:02/17/2007                            DOB:          [birth year]    PROBLEM:  1. Asthmatic bronchitis.  2. Hypertrophic cardiomyopathy.  3. Esophageal reflux.  4. Allergic rhinitis.  5. Degenerative disc disease.  6. Hypercalcemia.   HISTORY:  Last here in March. She had not made the 6 month followup we  had suggested then. Dr. Elyn Peers is now her new primary physician in [place].  Dr. Elyn Peers called me to review her situation reporting increased dyspnea  and saying that cardiac basis had been excluded. A trial of antibiotics  and prednisone have been given. The patient says she has been more short  of breath over the last few weeks with increased cough, not much sputum.  She has been stressed and tired trying to take care of her husband who  had knee surgery and has had a tight feeling through the mid chest. She  says her cardiologist had no specific findings. The sense of shortness  of breath is somewhat better now but she still has a heavy substernal  feeling. She finished her prednisone taper 2 days ago. She has been  using nebulized albuterol 4 times a day this week.   MEDICATIONS:  1. Lasix 20 mg.  2. Advair 250/50.  3. Levothyroxine.  4. Furosemide 40 mg.  5. Potassium.  6. Prilosec 40 mg.  7. __________ .  8. Nasonex.  9. Lisinopril 20 mg times one and a half.  10.Albuterol by nebulizer or by metered inhaler.  11.Darvocet.  12.Stool softener.   DRUG INTOLERANT:  TALOCID   OBJECTIVE:  Weight 218 pounds, blood pressure 142/70, pulse regular 88,  room air saturation 96%. Dry cough only with deep breath. No rales or  wheeze.  HEART SOUNDS: Regular without murmur or rub.  No neck vein distension or strider.  No peripheral edema.  She is quite heavy, does not seem to be in acute distress.   IMPRESSION:   Sounds like an episode of bronchitis perhaps on a viral  basis, gradually improving. Broad differential is difficult to evaluate  but as long as she is getting better and has just finished an antibiotic  and prednisone taper it may be best to wait, avoid over treating but  watch. My one concern would be that her lisinopril is quite capable of  causing a dry annoying cough and it may be good to switch her to an ARB  if appropriate.   PLAN:  1. We are scheduling pulmonary function test.  2. I have discussed the gradual progression over the next week and a      half or so that I would expect if this is a resolving bronchitis      syndrome and asked her to call us or Dr. Elyn Peers if she fails to      continue improving or if she gets worse. We are scheduling return      here in 4  months but earlier p.r.n.     Clinton D. Maple Hudson, MD, Tonny Bollman, FACP  Electronically Signed    CDY/MedQ  DD: 02/17/2007  DT: 02/18/2007  Job #: [id number]   cc:   Pleas Patricia, Dr.
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
## 2010-10-18 NOTE — Assessment & Plan Note (Signed)
[facility]                             PULMONARY OFFICE NOTE   NAME:[name], Wendy                       MRN:          [id number]  DATE:08/05/2006                            DOB:          January 14, 1941    PROBLEMS:  1. Asthmatic bronchitis.  2. Hypertrophic cardiomyopathy.  3. Esophageal reflux.  4. Allergic rhinitis.  5. Degenerative disk disease.  6. Hypercalcemia.   HISTORY OF PRESENT ILLNESS:  For the past week, has been sick with an  illness beginning with sore throat, headache, fever, productive cough.  She has been bed confined for much of this and taking Coricidin Hbp.  She has had pneumococcal vaccine twice.  She had had a pneumonia in  August and then a head cold.  Sputum now is brown, and she feels quite  weak.   MEDICATIONS:  1. Lasix 20 mg.  2. Advair 250/50.  3. Levothyroxine 50 mcg.  4. Potassium.  5. Home nebulizer with albuterol.  6. Prilosec 40 mg.  7. Nitrofurazone.  8. Nasonex.  9. Lisinopril 20 mg times one and a half.  10.She has a rescue albuterol inhaler.   OBJECTIVE:  VITAL SIGNS:  Weight 200 pounds, blood pressure 114/88,  pulse 66, room air saturation 97%.  GENERAL:  She looks weak and tired, mild chest congestion, unlabored, no  focal dullness.  HEART:  Sounds are regular and faintly heard.  I do not make out a  murmur.  NECK:  There is no neck vein distention, no edema.   IMPRESSION:  1. Asthmatic bronchitis with exacerbation.  2. Flu syndrome.   PLAN:  Z-pack, chest x-ray, fluids and rest.  Schedule to return in six  months, earlier p.r.n.    Clinton D. Maple Hudson, MD, Tonny Bollman, FACP  Electronically Signed   CDY/MedQ  DD: 08/05/2006  DT: 08/05/2006  Job #: [id number]

## 2010-10-18 NOTE — Assessment & Plan Note (Signed)
[facility]                             PULMONARY OFFICE NOTE   NAME:[name], Wendy                       MRN:          [id number]  DATE:08/05/2006                            DOB:          10-Jun-1940    Cancelled, I already dictated this.     Clinton D. Maple Hudson, MD, FCCP, FACP     CDY/MedQ  DD: 08/05/2006  DT: 08/05/2006  Job #: [id number]

## 2010-10-18 NOTE — H&P (Signed)
[facility]  Patient:    Wendy [name], Wendy [name]                       MRN: [id number] Adm. Date:  11/01/99 Attending:  Thyra Breed, M.D. CC:         Reinaldo Meeker, M.D.                         History and Physical  HISTORY OF PRESENT ILLNESS:  Wendy [name] is a 70 year old who is sent to [place] by Dr. Rolanda Lundborg Kritzer for possible epidural blood patch.  HISTORY OF PRESENT ILLNESS:  The patient presents and states that her headache resolved yesterday.  She now has excruciating lower back pain, which she relates is increasing in severity after her lumbar myelogram.  She complains of pain radiating out to the right lower extremity, which she described as similar to her previous pain, but much worse.  She apparently had her myelogram done on Oct 25, 1999.  The patient was asked to sit up, and she remained so for about one hour before er lower back discomfort began to bother her.  She did not develop a headache in that time span.  PHYSICAL EXAMINATION:  VITAL SIGNS:  Blood pressure 141/64, heart rate 77, respirations 18, O2 saturation 97%.  Pain level is 10/10.  EXTREMITIES:  She exhibited brisk deep tendon reflexes at the knees and ankles,  with positive straight leg raising sign on the right.  CURRENT MEDICATIONS:  1. Prilosec 20 mg q.d.  2. Levoxyl 0.05 mg q.d.  3. Premarin 0.625 mg q.d.  4. Hydrochlorothiazide 25 mg q.d.  5. Flovent q.d.  6. Serevent q.d.  7. Maxair q.d.  8. Claritin q.d.  9. Celebrex q.d. 10. Vicodin which she has been taking two tablets q.6h.  She was formerly on [employer] and OxyContin, and she stated that the Percocet was much more helpful.  IMPRESSION: 1. Recent headache, most likely a posterior puncture headache, which is    resolving. 2. Low back pain with lumbar radicular pattern symptomatically, with a    myelogram demonstrating a small L5-S1 herniated disk between the S1 nerve    roots, which does not  appear to be effacing the nerve roots. 3. Other medical problems per her primary care physician.  DISPOSITION: 1. I advised the patient of the potential benefits and risks of a lumbar    epidural blood patch, but advised her that it would not help with her lower    back discomfort.  She stated that she does not feel as though her headache    is bad enough to warrant proceeding with this. 2. I went ahead and gave her some Percocet 5/325 one p.o. to two p.o. q.6h. p.r.n.    until she can get in with Dr. Rolanda Lundborg Kritzer next week. 3. The patient was encouraged to consume copious amounts of fluids in the    nature of two 2 L bottles of Coke or Pepsi per day for the next three days. DD:  11/01/99 TD:  11/01/99 Job: [id number]

## 2010-11-01 ENCOUNTER — Encounter: Payer: Self-pay | Admitting: Pulmonary Disease

## 2010-11-25 ENCOUNTER — Ambulatory Visit: Payer: Self-pay | Admitting: Urology

## 2011-02-14 ENCOUNTER — Ambulatory Visit: Payer: Self-pay | Admitting: Family Medicine

## 2011-02-26 LAB — CBC
HCT: 32.9 — ABNORMAL LOW
Hemoglobin: 10.8 — ABNORMAL LOW
Platelets: 376
RDW: 17.1 — ABNORMAL HIGH
WBC: 6.6

## 2011-02-26 LAB — COMPREHENSIVE METABOLIC PANEL
ALT: 112 — ABNORMAL HIGH
Albumin: 3.6
Alkaline Phosphatase: 142 — ABNORMAL HIGH
BUN: 17
Chloride: 108
Potassium: 4
Sodium: 141
Total Bilirubin: 0.5
Total Protein: 7.5

## 2011-02-26 LAB — URINALYSIS, ROUTINE W REFLEX MICROSCOPIC
Glucose, UA: NEGATIVE
Ketones, ur: NEGATIVE
pH: 6

## 2011-02-26 LAB — URINE MICROSCOPIC-ADD ON

## 2011-02-26 LAB — DIFFERENTIAL
Eosinophils Relative: 2
Lymphocytes Relative: 35
Lymphs Abs: 2.3
Monocytes Relative: 13 — ABNORMAL HIGH

## 2011-07-09 ENCOUNTER — Ambulatory Visit: Payer: Self-pay | Admitting: Family

## 2011-07-16 ENCOUNTER — Telehealth: Payer: Self-pay | Admitting: Oncology

## 2011-07-16 NOTE — Telephone Encounter (Signed)
Lvm advising appt 4/23 has been moved to 09/24/11 @ 11.30am due to md schedule change.

## 2011-07-24 ENCOUNTER — Encounter (INDEPENDENT_AMBULATORY_CARE_PROVIDER_SITE_OTHER): Payer: Self-pay | Admitting: General Surgery

## 2011-07-24 DIAGNOSIS — Z853 Personal history of malignant neoplasm of breast: Secondary | ICD-10-CM | POA: Insufficient documentation

## 2011-07-25 ENCOUNTER — Ambulatory Visit (INDEPENDENT_AMBULATORY_CARE_PROVIDER_SITE_OTHER): Payer: Medicare Other | Admitting: Surgery

## 2011-07-25 ENCOUNTER — Encounter (INDEPENDENT_AMBULATORY_CARE_PROVIDER_SITE_OTHER): Payer: Self-pay | Admitting: Surgery

## 2011-07-25 VITALS — BP 130/66 | HR 72 | Temp 98.6°F | Resp 12 | Ht 66.0 in | Wt 185.6 lb

## 2011-07-25 DIAGNOSIS — Z853 Personal history of malignant neoplasm of breast: Secondary | ICD-10-CM

## 2011-07-25 NOTE — Progress Notes (Signed)
NAME: Wendy [name]       DOB: 12/24/1940           DATE: 07/25/2011       MRN: [id number]   Wendy [name] is a 71 y.o.Marland Kitchenfemale who presents for routine followup of her Right IDC diagnosed in 2009 and treated with mastectomy and SLN. She has no problems or concerns on either side.  PFSH: She has had no significant changes since the last visit here.  ROS: There have been no significant changes since the last visit hereShe has intermittent issues with pyelonephritis. She has lost aout 45 # on a weight loss program  EXAM: General: The patient is alert, oriented, generally healty appearing, NAD. Mood and affect are normal.  Breasts:  Right s/p mastectomy with no evidence of recurrence. Left pendulous, but normal  Lymphatics: She has no axillary or supraclavicular adenopathy on either side.  Extremities: Full ROM of the surgical side with no lymphedema noted.  Data Reviewed: Mammogram due  Impression: Doing well, with no evidence of recurrent cancer or new cancer  Plan: Will continue to follow up on an annual basis here.

## 2011-09-03 ENCOUNTER — Encounter (INDEPENDENT_AMBULATORY_CARE_PROVIDER_SITE_OTHER): Payer: Self-pay

## 2011-09-24 ENCOUNTER — Encounter: Payer: Self-pay | Admitting: Oncology

## 2011-09-24 ENCOUNTER — Telehealth: Payer: Self-pay | Admitting: Oncology

## 2011-09-24 ENCOUNTER — Other Ambulatory Visit (HOSPITAL_BASED_OUTPATIENT_CLINIC_OR_DEPARTMENT_OTHER): Payer: Medicare Other | Admitting: Lab

## 2011-09-24 ENCOUNTER — Ambulatory Visit (HOSPITAL_BASED_OUTPATIENT_CLINIC_OR_DEPARTMENT_OTHER): Payer: Medicare Other | Admitting: Oncology

## 2011-09-24 VITALS — BP 133/71 | HR 53 | Temp 98.3°F | Ht 66.0 in | Wt 185.3 lb

## 2011-09-24 DIAGNOSIS — Z853 Personal history of malignant neoplasm of breast: Secondary | ICD-10-CM

## 2011-09-24 DIAGNOSIS — J449 Chronic obstructive pulmonary disease, unspecified: Secondary | ICD-10-CM

## 2011-09-24 LAB — COMPREHENSIVE METABOLIC PANEL
AST: 19 U/L (ref 0–37)
Albumin: 4.2 g/dL (ref 3.5–5.2)
Alkaline Phosphatase: 58 U/L (ref 39–117)
Potassium: 4.7 mEq/L (ref 3.5–5.3)
Sodium: 141 mEq/L (ref 135–145)
Total Protein: 6.6 g/dL (ref 6.0–8.3)

## 2011-09-24 LAB — CBC WITH DIFFERENTIAL/PLATELET
BASO%: 0.8 % (ref 0.0–2.0)
EOS%: 3.5 % (ref 0.0–7.0)
MCH: 33.4 pg (ref 25.1–34.0)
MCHC: 35.3 g/dL (ref 31.5–36.0)
RDW: 12.1 % (ref 11.2–14.5)
lymph#: 2.1 10*3/uL (ref 0.9–3.3)

## 2011-09-24 NOTE — Patient Instructions (Signed)
1. You are doing well.   2. I will continue to see you on a yearly basis. Your next visit will be on 09/23/12 with blood work on the same day as the visit

## 2011-09-24 NOTE — Progress Notes (Signed)
OFFICE PROGRESS NOTE  CC  Dr. Kern Alberta Dr. Cyndia Bent  DIAGNOSIS: 71 year old female with stage I invasive ductal carcinoma.  PRIOR THERAPY:  #1 patient is status post right mastectomy about 4 years ago. The tumor was ER positive. She was begun on an aromatase inhibitor Arimidex but she could not tolerate it and developed liver toxicity and this was disc continued.  CURRENT THERAPY:Observation  INTERVAL HISTORY: Wendy [name] 71 y.o. female returns for Followup visit at one year her last visit with me was back in January 2012. Clinically she seems to be doing well she does tell me that she recently completed a 10 day course of antibiotics because of an upper respiratory tract infection. She denies any fevers chills night sweats headaches she has no shortness of breath no chest pains. She does continue to use oxygen periodically for her COPD. Remainder of the 10 point review of systems is negative.  MEDICAL HISTORY: Past Medical History  Diagnosis Date  . Anemia   . Arthritis   . Asthma   . Blood transfusion   . Cancer     right breast  . Hypertension   . Hyperlipidemia     ALLERGIES:  is allergic to codeine; latex; nitrofurantoin; and oxycodone hcl.  MEDICATIONS:  Current Outpatient Prescriptions  Medication Sig Dispense Refill  . albuterol (PROVENTIL) (2.5 MG/3ML) 0.083% nebulizer solution Take 2.5 mg by nebulization as needed.        Marland Kitchen amLODipine (NORVASC) 5 MG tablet Take 5 mg by mouth daily.      . beclomethasone (QVAR) 80 MCG/ACT inhaler Inhale 1 puff into the lungs as needed.      . cyclobenzaprine (FLEXERIL) 10 MG tablet Take 10 mg by mouth 3 (three) times daily as needed.      . fluticasone (VERAMYST) 27.5 MCG/SPRAY nasal spray Place 2 sprays into the nose daily.      . furosemide (LASIX) 40 MG tablet Take 40 mg by mouth daily.        . hyoscyamine (LEVBID) 0.375 MG 12 hr tablet Take 0.375 mg by mouth 2 (two) times daily.        Marland Kitchen levothyroxine  (SYNTHROID, LEVOTHROID) 50 MCG tablet Take 50 mcg by mouth daily.        Marland Kitchen losartan (COZAAR) 50 MG tablet Take 50 mg by mouth daily.      . nitroGLYCERIN (NITROSTAT) 0.3 MG SL tablet Place 0.3 mg under the tongue as needed.        . NON FORMULARY oxygen      . omeprazole (PRILOSEC) 40 MG capsule Take 40 mg by mouth daily.          SURGICAL HISTORY:  Past Surgical History  Procedure Date  . Mastectomy     right  . Appendectomy   . Hernia repair   . Oophorectomy     right  . Abdominal hysterectomy   . Knee surgery     right knee  . Bladder surgery   . Foot surgery   . Kidney stone surgery   . Colon surgery   . Back surgery   . Parathyroidectomy     REVIEW OF SYSTEMS:  Pertinent items are noted in HPI.   PHYSICAL EXAMINATION: General appearance: alert, cooperative and appears stated age Neck: no adenopathy, no carotid bruit, no JVD, supple, symmetrical, trachea midline and thyroid not enlarged, symmetric, no tenderness/mass/nodules Lymph nodes: Cervical, supraclavicular, and axillary nodes normal. Resp: clear to auscultation bilaterally Back: symmetric, no curvature. ROM  normal. No CVA tenderness. Cardio: regular rate and rhythm, S1, S2 normal, no murmur, click, rub or gallop GI: soft, non-tender; bowel sounds normal; no masses,  no organomegaly Extremities: extremities normal, atraumatic, no cyanosis or edema Neurologic: Grossly normal Right mastectomy scar well healed no evidence of local recurrence left breast no masses nipple discharge or skin change ECOG PERFORMANCE STATUS: 1 - Symptomatic but completely ambulatory  Blood pressure 133/71, pulse 53, temperature 98.3 F (36.8 C), temperature source Oral, height 5\' 6"  (1.676 m), weight 185 lb 4.8 oz (84.052 kg).  LABORATORY DATA: Lab Results  Component Value Date   WBC 5.6 09/24/2011   HGB 13.1 09/24/2011   HCT 37.3 09/24/2011   MCV 94.7 09/24/2011   PLT 240 09/24/2011      Chemistry      Component Value Date/Time    NA 142 06/27/2010 1046   NA 139 12/19/2009 1059   K 4.3 06/27/2010 1046   K 4.7 12/19/2009 1059   CL 105 06/27/2010 1046   CL 100 12/19/2009 1059   CO2 26 06/27/2010 1046   CO2 27 12/19/2009 1059   BUN 19 06/27/2010 1046   BUN 21 12/19/2009 1059   CREATININE 1.03 06/27/2010 1046   CREATININE 1.3* 12/19/2009 1059      Component Value Date/Time   CALCIUM 9.7 06/27/2010 1046   CALCIUM 9.5 12/19/2009 1059   ALKPHOS 76 06/27/2010 1046   ALKPHOS 84 12/19/2009 1059   AST 20 06/27/2010 1046   AST 29 12/19/2009 1059   ALT 15 06/27/2010 1046   BILITOT 0.6 06/27/2010 1046   BILITOT 0.70 12/19/2009 1059       RADIOGRAPHIC STUDIES:  No results found.  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 We did try to treat her with an AI adjuvantly but she could not tolerate it and develop significant toxicity and this was can discontinue. She declined any other form of therapy. As she has been on observation only and she continues to do quite well she has no evidence of recurrent disease clinically appear   PLAN: Continue observation and I will see her on a yearly basis for the first 5 years.   All questions were answered. The patient knows to call the clinic with any problems, questions or concerns. We can certainly see the patient much sooner if necessary.  I spent 20 minutes counseling the patient face to face. The total time spent in the appointment was 30 minutes.    Drue Second, MD Medical/Oncology [facility] [phone] (beeper) [phone] (Office)  09/24/2011, 12:33 PM

## 2011-09-24 NOTE — Telephone Encounter (Signed)
gve the pt her April 2014 appt calendar °

## 2011-11-26 ENCOUNTER — Ambulatory Visit: Payer: Self-pay | Admitting: Urology

## 2012-03-20 ENCOUNTER — Emergency Department: Payer: Self-pay | Admitting: Emergency Medicine

## 2012-03-20 LAB — BASIC METABOLIC PANEL
Anion Gap: 9 (ref 7–16)
BUN: 32 mg/dL — ABNORMAL HIGH (ref 7–18)
Calcium, Total: 9.3 mg/dL (ref 8.5–10.1)
Chloride: 108 mmol/L — ABNORMAL HIGH (ref 98–107)
Co2: 27 mmol/L (ref 21–32)
Creatinine: 1.3 mg/dL (ref 0.60–1.30)
EGFR (African American): 48 — ABNORMAL LOW
EGFR (Non-African Amer.): 42 — ABNORMAL LOW
Glucose: 105 mg/dL — ABNORMAL HIGH (ref 65–99)
Osmolality: 294 (ref 275–301)
Potassium: 4.2 mmol/L (ref 3.5–5.1)
Sodium: 144 mmol/L (ref 136–145)

## 2012-03-20 LAB — URINALYSIS, COMPLETE
Glucose,UR: NEGATIVE mg/dL (ref 0–75)
Hyaline Cast: 12
Nitrite: NEGATIVE
Specific Gravity: 1.02 (ref 1.003–1.030)

## 2012-03-20 LAB — CBC
HCT: 41.1 % (ref 35.0–47.0)
HGB: 14.1 g/dL (ref 12.0–16.0)
MCH: 32.4 pg (ref 26.0–34.0)
MCHC: 34.2 g/dL (ref 32.0–36.0)
MCV: 95 fL (ref 80–100)
Platelet: 226 10*3/uL (ref 150–440)
RBC: 4.33 10*6/uL (ref 3.80–5.20)
RDW: 12.6 % (ref 11.5–14.5)
WBC: 5.8 10*3/uL (ref 3.6–11.0)

## 2012-03-20 LAB — LIPASE, BLOOD: Lipase: 156 U/L (ref 73–393)

## 2012-03-20 LAB — HEPATIC FUNCTION PANEL A (ARMC)
Bilirubin,Total: 0.6 mg/dL (ref 0.2–1.0)
SGPT (ALT): 26 U/L (ref 12–78)

## 2012-03-20 LAB — TROPONIN I: Troponin-I: <0.02 ng/mL

## 2012-03-20 LAB — CK TOTAL AND CKMB (NOT AT ARMC)
CK, Total: 108 U/L (ref 21–215)
CK-MB: 1.3 ng/mL (ref 0.5–3.6)

## 2012-03-28 ENCOUNTER — Emergency Department: Payer: Self-pay | Admitting: Unknown Physician Specialty

## 2012-03-28 LAB — COMPREHENSIVE METABOLIC PANEL
Albumin: 3.7 g/dL (ref 3.4–5.0)
Alkaline Phosphatase: 76 U/L (ref 50–136)
Anion Gap: 10 (ref 7–16)
Bilirubin,Total: 0.5 mg/dL (ref 0.2–1.0)
Creatinine: 1.02 mg/dL (ref 0.60–1.30)
Glucose: 99 mg/dL (ref 65–99)
Osmolality: 271 (ref 275–301)
Potassium: 3.7 mmol/L (ref 3.5–5.1)
Sodium: 136 mmol/L (ref 136–145)

## 2012-03-28 LAB — RAPID INFLUENZA A&B ANTIGENS

## 2012-03-28 LAB — PRO B NATRIURETIC PEPTIDE: B-Type Natriuretic Peptide: 179 pg/mL — ABNORMAL HIGH (ref 0–125)

## 2012-03-28 LAB — CBC
HGB: 13.5 g/dL (ref 12.0–16.0)
MCHC: 35.2 g/dL (ref 32.0–36.0)
RDW: 12 % (ref 11.5–14.5)

## 2012-04-03 LAB — CULTURE, BLOOD (SINGLE)

## 2012-09-16 ENCOUNTER — Ambulatory Visit: Payer: Self-pay | Admitting: Internal Medicine

## 2012-09-23 ENCOUNTER — Other Ambulatory Visit (HOSPITAL_BASED_OUTPATIENT_CLINIC_OR_DEPARTMENT_OTHER): Payer: Medicare Other | Admitting: Lab

## 2012-09-23 ENCOUNTER — Ambulatory Visit (HOSPITAL_BASED_OUTPATIENT_CLINIC_OR_DEPARTMENT_OTHER): Payer: Medicare Other | Admitting: Oncology

## 2012-09-23 ENCOUNTER — Encounter: Payer: Self-pay | Admitting: Oncology

## 2012-09-23 VITALS — BP 143/75 | HR 65 | Temp 97.5°F | Resp 20 | Ht 66.0 in | Wt 211.2 lb

## 2012-09-23 DIAGNOSIS — Z853 Personal history of malignant neoplasm of breast: Secondary | ICD-10-CM

## 2012-09-23 LAB — COMPREHENSIVE METABOLIC PANEL (CC13)
AST: 16 U/L (ref 5–34)
Albumin: 3.6 g/dL (ref 3.5–5.0)
Alkaline Phosphatase: 73 U/L (ref 40–150)
BUN: 22.8 mg/dL (ref 7.0–26.0)
Potassium: 4.3 mEq/L (ref 3.5–5.1)
Total Bilirubin: 0.46 mg/dL (ref 0.20–1.20)

## 2012-09-23 LAB — CBC WITH DIFFERENTIAL/PLATELET
Basophils Absolute: 0 10*3/uL (ref 0.0–0.1)
EOS%: 3.7 % (ref 0.0–7.0)
HGB: 13 g/dL (ref 11.6–15.9)
LYMPH%: 38.1 % (ref 14.0–49.7)
MCH: 32.1 pg (ref 25.1–34.0)
MCV: 94 fL (ref 79.5–101.0)
MONO%: 8.1 % (ref 0.0–14.0)
RBC: 4.05 10*6/uL (ref 3.70–5.45)
RDW: 12.7 % (ref 11.2–14.5)

## 2012-09-23 NOTE — Patient Instructions (Addendum)
Continue to follow up with your primary care physician  I can see you on a as needed basis  Certainly you can transfer your care to [facility]

## 2012-09-23 NOTE — Progress Notes (Signed)
OFFICE PROGRESS NOTE  CC  Dr. Kern Alberta Dr. Cyndia Bent  DIAGNOSIS: 72 year old female with stage I invasive ductal carcinoma.  PRIOR THERAPY:  #1 patient is status post right mastectomy about 4 years ago. The tumor was ER positive. She was begun on an aromatase inhibitor Arimidex but she could not tolerate it and developed liver toxicity and this was disc continued.  CURRENT THERAPY:Observation  INTERVAL HISTORY: Wendy [name] 72 y.o. female returns for Followup visit at one year her last visit with me was back in April 2013. Clinically she seems to be doing well she does tell me that she recently completed a 10 day course of antibiotics because of an upper respiratory tract infection. She denies any fevers chills night sweats headaches she has no shortness of breath no chest pains. She does continue to use oxygen periodically for her COPD. Remainder of the 10 point review of systems is negative.  MEDICAL HISTORY: Past Medical History  Diagnosis Date  . Anemia   . Arthritis   . Asthma   . Blood transfusion   . Cancer     right breast  . Hypertension   . Hyperlipidemia     ALLERGIES:  is allergic to codeine; latex; nitrofurantoin; and oxycodone hcl.  MEDICATIONS:  Current Outpatient Prescriptions  Medication Sig Dispense Refill  . albuterol (PROVENTIL) (2.5 MG/3ML) 0.083% nebulizer solution Take 2.5 mg by nebulization as needed.        Marland Kitchen amLODipine (NORVASC) 5 MG tablet Take 5 mg by mouth daily.      . beclomethasone (QVAR) 80 MCG/ACT inhaler Inhale 1 puff into the lungs as needed.      . fluticasone (FLONASE) 50 MCG/ACT nasal spray       . fluticasone (VERAMYST) 27.5 MCG/SPRAY nasal spray Place 2 sprays into the nose daily.      . furosemide (LASIX) 40 MG tablet Take 40 mg by mouth daily.        . hyoscyamine (LEVBID) 0.375 MG 12 hr tablet Take 0.375 mg by mouth 2 (two) times daily.        Marland Kitchen levothyroxine (SYNTHROID, LEVOTHROID) 50 MCG tablet Take 50 mcg by  mouth daily.        Marland Kitchen losartan (COZAAR) 50 MG tablet Take 50 mg by mouth daily.      . NON FORMULARY oxygen      . omeprazole (PRILOSEC) 40 MG capsule Take 40 mg by mouth daily.        . cyclobenzaprine (FLEXERIL) 10 MG tablet Take 10 mg by mouth 3 (three) times daily as needed.      . nitroGLYCERIN (NITROSTAT) 0.3 MG SL tablet Place 0.3 mg under the tongue as needed.         No current facility-administered medications for this visit.    SURGICAL HISTORY:  Past Surgical History  Procedure Laterality Date  . Mastectomy      right  . Appendectomy    . Hernia repair    . Oophorectomy      right  . Abdominal hysterectomy    . Knee surgery      right knee  . Bladder surgery    . Foot surgery    . Kidney stone surgery    . Colon surgery    . Back surgery    . Parathyroidectomy      REVIEW OF SYSTEMS:  Pertinent items are noted in HPI.   PHYSICAL EXAMINATION: General appearance: alert, cooperative and appears stated age Neck: no  adenopathy, no carotid bruit, no JVD, supple, symmetrical, trachea midline and thyroid not enlarged, symmetric, no tenderness/mass/nodules Lymph nodes: Cervical, supraclavicular, and axillary nodes normal. Resp: clear to auscultation bilaterally Back: symmetric, no curvature. ROM normal. No CVA tenderness. Cardio: regular rate and rhythm, S1, S2 normal, no murmur, click, rub or gallop GI: soft, non-tender; bowel sounds normal; no masses,  no organomegaly Extremities: extremities normal, atraumatic, no cyanosis or edema Neurologic: Grossly normal Right mastectomy scar well healed no evidence of local recurrence left breast no masses nipple discharge or skin change ECOG PERFORMANCE STATUS: 1 - Symptomatic but completely ambulatory  Blood pressure 143/75, pulse 65, temperature 97.5 F (36.4 C), temperature source Oral, resp. rate 20, height 5\' 6"  (1.676 m), weight 211 lb 3.2 oz (95.8 kg).  LABORATORY DATA: Lab Results  Component Value Date   WBC 4.9  09/23/2012   HGB 13.0 09/23/2012   HCT 38.1 09/23/2012   MCV 94.0 09/23/2012   PLT 199 09/23/2012      Chemistry      Component Value Date/Time   NA 139 09/23/2012 1041   NA 141 09/24/2011 0958   NA 139 12/19/2009 1059   K 4.3 09/23/2012 1041   K 4.7 09/24/2011 0958   K 4.7 12/19/2009 1059   CL 106 09/23/2012 1041   CL 108 09/24/2011 0958   CL 100 12/19/2009 1059   CO2 25 09/23/2012 1041   CO2 25 09/24/2011 0958   CO2 27 12/19/2009 1059   BUN 22.8 09/23/2012 1041   BUN 25* 09/24/2011 0958   BUN 21 12/19/2009 1059   CREATININE 1.2* 09/23/2012 1041   CREATININE 1.01 09/24/2011 0958   CREATININE 1.3* 12/19/2009 1059      Component Value Date/Time   CALCIUM 9.2 09/23/2012 1041   CALCIUM 9.2 09/24/2011 0958   CALCIUM 9.5 12/19/2009 1059   ALKPHOS 73 09/23/2012 1041   ALKPHOS 58 09/24/2011 0958   ALKPHOS 84 12/19/2009 1059   AST 16 09/23/2012 1041   AST 19 09/24/2011 0958   AST 29 12/19/2009 1059   ALT 11 09/23/2012 1041   ALT 11 09/24/2011 0958   BILITOT 0.46 09/23/2012 1041   BILITOT 0.5 09/24/2011 0958   BILITOT 0.70 12/19/2009 1059       RADIOGRAPHIC STUDIES:  No results found.  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 We did try to treat her with an AI adjuvantly but she could not tolerate it and develop significant toxicity and this was can discontinue. She declined any other form of therapy. As she has been on observation only and she continues to do quite well she has no evidence of recurrent disease clinically appear   PLAN: Patient is now 5 years out since her original diagnosis of right breast cancer. She has no evidence of recurrent disease. Patient can now be seen by me on an as needed basis. However patient does tell me that she would like to be seen at [facility] since she is closer to their. I am in agreement with her on this..   All questions were answered. The patient knows to  call the clinic with any problems, questions or concerns. We can certainly see the patient much sooner if necessary.  I spent 20 minutes counseling the patient face to face. The total time spent in the appointment was 30 minutes.    Drue Second, MD Medical/Oncology [facility] [phone] (beeper) [phone] (Office)  09/23/2012, 3:57 PM

## 2012-12-08 ENCOUNTER — Ambulatory Visit: Payer: Self-pay | Admitting: Urology

## 2013-03-11 ENCOUNTER — Ambulatory Visit: Payer: Self-pay | Admitting: Internal Medicine

## 2013-03-11 LAB — CBC WITH DIFFERENTIAL/PLATELET
Basophil #: 0 10*3/uL (ref 0.0–0.1)
Eosinophil #: 0.1 10*3/uL (ref 0.0–0.7)
Eosinophil %: 1.9 %
HGB: 12 g/dL (ref 12.0–16.0)
MCHC: 33.3 g/dL (ref 32.0–36.0)
MCV: 94 fL (ref 80–100)
Neutrophil #: 3.7 10*3/uL (ref 1.4–6.5)
Neutrophil %: 49.7 %
RBC: 3.81 10*6/uL (ref 3.80–5.20)
RDW: 13 % (ref 11.5–14.5)

## 2013-03-18 ENCOUNTER — Ambulatory Visit: Payer: Self-pay | Admitting: Gastroenterology

## 2013-04-28 ENCOUNTER — Inpatient Hospital Stay: Payer: Self-pay | Admitting: Internal Medicine

## 2013-04-28 LAB — CBC WITH DIFFERENTIAL/PLATELET
Basophil #: 0 10*3/uL (ref 0.0–0.1)
Eosinophil %: 2.1 %
HCT: 29.8 % — ABNORMAL LOW (ref 35.0–47.0)
MCH: 28.6 pg (ref 26.0–34.0)
MCV: 86 fL (ref 80–100)
Neutrophil #: 2.8 10*3/uL (ref 1.4–6.5)
Neutrophil %: 49.8 %
RBC: 3.47 10*6/uL — ABNORMAL LOW (ref 3.80–5.20)
RDW: 13.7 % (ref 11.5–14.5)
WBC: 5.7 10*3/uL (ref 3.6–11.0)

## 2013-04-28 LAB — COMPREHENSIVE METABOLIC PANEL
Albumin: 3.6 g/dL (ref 3.4–5.0)
Anion Gap: 7 (ref 7–16)
Bilirubin,Total: 0.3 mg/dL (ref 0.2–1.0)
EGFR (Non-African Amer.): 58 — ABNORMAL LOW
Osmolality: 287 (ref 275–301)
Total Protein: 6.6 g/dL (ref 6.4–8.2)

## 2013-04-29 LAB — URINALYSIS, COMPLETE
Blood: NEGATIVE
Ketone: NEGATIVE
Nitrite: NEGATIVE
Protein: NEGATIVE
RBC,UR: NONE SEEN /HPF (ref 0–5)
Specific Gravity: 1.014 (ref 1.003–1.030)

## 2013-04-29 LAB — CBC WITH DIFFERENTIAL/PLATELET
Basophil %: 0.9 %
Eosinophil #: 0.2 10*3/uL (ref 0.0–0.7)
Lymphocyte #: 1.9 10*3/uL (ref 1.0–3.6)
Lymphocyte %: 38.8 %
MCH: 28.8 pg (ref 26.0–34.0)
MCV: 86 fL (ref 80–100)
Monocyte %: 8.6 %
Platelet: 197 10*3/uL (ref 150–440)
RBC: 3.08 10*6/uL — ABNORMAL LOW (ref 3.80–5.20)

## 2013-08-29 DIAGNOSIS — I5032 Chronic diastolic (congestive) heart failure: Secondary | ICD-10-CM | POA: Diagnosis present

## 2013-08-29 DIAGNOSIS — I5033 Acute on chronic diastolic (congestive) heart failure: Secondary | ICD-10-CM | POA: Diagnosis present

## 2013-09-02 DIAGNOSIS — I10 Essential (primary) hypertension: Secondary | ICD-10-CM | POA: Diagnosis present

## 2013-09-09 DIAGNOSIS — E039 Hypothyroidism, unspecified: Secondary | ICD-10-CM | POA: Diagnosis present

## 2013-12-23 ENCOUNTER — Ambulatory Visit: Payer: Self-pay | Admitting: Family Medicine

## 2013-12-23 LAB — CBC WITH DIFFERENTIAL/PLATELET
BASOS PCT: 0.8 %
Basophil #: 0.1 10*3/uL (ref 0.0–0.1)
EOS PCT: 2.4 %
Eosinophil #: 0.2 10*3/uL (ref 0.0–0.7)
HCT: 32.6 % — ABNORMAL LOW (ref 35.0–47.0)
HGB: 11.2 g/dL — AB (ref 12.0–16.0)
LYMPHS ABS: 2.5 10*3/uL (ref 1.0–3.6)
LYMPHS PCT: 38.5 %
MCH: 33.2 pg (ref 26.0–34.0)
MCHC: 34.2 g/dL (ref 32.0–36.0)
MCV: 97 fL (ref 80–100)
MONOS PCT: 7.3 %
Monocyte #: 0.5 x10 3/mm (ref 0.2–0.9)
NEUTROS PCT: 51 %
Neutrophil #: 3.4 10*3/uL (ref 1.4–6.5)
PLATELETS: 271 10*3/uL (ref 150–440)
RBC: 3.37 10*6/uL — ABNORMAL LOW (ref 3.80–5.20)
RDW: 14 % (ref 11.5–14.5)
WBC: 6.6 10*3/uL (ref 3.6–11.0)

## 2013-12-23 LAB — OCCULT BLOOD X 1 CARD TO LAB, STOOL: Occult Blood, Feces: POSITIVE

## 2014-01-04 ENCOUNTER — Ambulatory Visit: Payer: Self-pay | Admitting: Ophthalmology

## 2014-02-01 ENCOUNTER — Ambulatory Visit: Payer: Self-pay | Admitting: Ophthalmology

## 2014-08-16 DIAGNOSIS — I2510 Atherosclerotic heart disease of native coronary artery without angina pectoris: Secondary | ICD-10-CM | POA: Diagnosis present

## 2014-09-22 NOTE — H&P (Signed)
PATIENT NAME:  Wendy [name], Wendy [name] MR#:  [id number] DATE OF BIRTH:  1941-01-11  DATE OF ADMISSION:  04/28/2013  PRIMARY CARE PHYSICIAN: Thereasa Distance, MD  EMERGENCY ROOM PHYSICIAN: Conni Slipper, MD  CHIEF COMPLAINT: Diarrhea with black stool.   HISTORY OF PRESENT ILLNESS: A 74 year old female patient with history of erosive esophagitis by EGD last month, came in because of black stool. Actually, the patient started to have diarrhea since yesterday evening associated with abdominal cramps. The patient had about 3 bouts of diarrhea yesterday and this morning. The patient had cramps followed by just black stool about 2 times. The patient had a hemoglobin checked with [facility] last week that was 10.9, but today it is 9.9. The patient denies any nausea, vomiting. No fever. The patient is going to be placed in observation. So the patient is admitted for GI bleed.   I spoke with Dr. Gustavo Lah. Monitor the hemoglobin closely, transfuse if  needed. The patient will be seen by Dr. Candace Cruise tomorrow for further need for EGD or not.  According to Dr. Gustavo Lah, she has chronic right-sided abdominal pain and also has big hiatal hernia.   PAST MEDICAL HISTORY: Significant for history of hypertension, hypothyroidism, history of COPD, and erosive esophagitis. Also includes the history of breast cancer status post mastectomy, radiation and chemotherapy.   MEDICATIONS:  1.  Albuterol nebulizer 4 times a day as needed for wheezing.  2.  Amlodipine 5 mg daily.  3.  4.  Flonase 50 mcg two sprays once a day.  5.  Furosemide 20 mg p.o. daily.  6.  Levothyroxine 50 mcg p.o. daily.  7.  Losartan 50 mg p.o. daily.  8.  Nitrostat sublingual p.r.n. 9.  QVAR 80 mcg inhalation 2 puffs b.i.d.  10.  Voltaren gel topically.   ALLERGIES: CODEINE,  ISOSORBIDE DINITRATE, (, LISINOPRIL, OXYCODONE, MACRODANTIN, OXYCONTIN, PREDNISONE AND LATEX.   SOCIAL HISTORY: Quit smoking. The patient lives with her husband who is 43 years  old.  Also significant for history of no smoking, no drinking.   PAST SURGICAL HISTORY: Significant for history of back surgeries, hysterectomy, bladder surgery,  kidney stone removal, carcinoid tumor removal, hernia repair, and appendectomy.   FAMILY HISTORY: Positive for hypertension, diabetes in the mother. Father had history of stroke and COPD.   REVIEW OF SYSTEMS: CONSTITUTIONAL: No fever. The patient feels fatigued.  EYES: No blurred vision.  ENT: No tinnitus. No ear pain. The patient has no throat pain. No epistaxis or aspiration. No cough. No wheezing.  CARDIOVASCULAR: No chest pain history.  GASTROINTESTINAL: Some black stool today and also some nausea this morning.  GENITOURINARY: No dysuria.  ENDOCRINE: No polyuria or nocturia.  HEMATOLOGIC: No history of anemia.  INTEGUMENTARY: No skin rashes.  MUSCULOSKELETAL: Arthritis.  NEUROLOGIC: No strokes.  PSYCHIATRIC: No depression.   PHYSICAL EXAMINATION:  VITAL SIGNS: Temperature 98.4, heart rate 93, blood pressure 213/96, sats 98% on room air.  GENERAL: Alert, awake, oriented, answering questions appropriately.  HEENT: Head: Atraumatic, normocephalic. Pupils equally reacting to light. Extraocular movements are intact.  NOSE: No turbinate hypertrophy.  EARS: No tympanic membrane congestion.  MOUTH: No lesions noted.  NECK: Supple. No JVD. No carotid bruit. Thyroid is in the midline. Normal range of motion.  RESPIRATORY: Clear to auscultation. No wheeze. No rales.  CARDIOVASCULAR: S1, S2 regular. No murmurs noticed. Cardiac palpation is within normal limits. Pulses are equal in carotid, femoral, dorsalis pedis regions. No peripheral edema.  GASTROINTESTINAL: Right-sided abdominal tenderness present. Bowel sounds present.  No hepatosplenomegaly. No rebound tenderness.  MUSCULOSKELETAL: Extremities moving x4. No tenderness. No effusion.  NEUROLOGIC: Cranial nerves II through XII intact. Power 5/5 in upper and lower extremities.  Sensation is intact. Deep tendon reflexes 2+ bilaterally.  PSYCHIATRIC: Judgment and insight are adequate.   LABORATORY DATA: WBC 5.7, hemoglobin 9.9, hematocrit 29.8, platelets 231. Electrolytes: Sodium is 141. Potassium 3.7, chloride 109, bicarb 25, BUN 30, creatinine 0.9, glucose 90. LFTs within normal limits.   EKG: Normal sinus rhythm with no ST-T changes.   ASSESSMENT AND PLAN:  1.  The patient is a 74 year old female patient with likely upper gastrointestinal bleed with melena. The patient's hemoglobin is stable, hemodynamically stable.  Probably because of her erosive esophagitis and also big hiatal hernia causing irritation, causing her to have some melena. Spoke to Dr. Gustavo Lah. He recommended to observe overnight. Monitor CBC q.12 hours. Start her on Protonix 40 mg q.12 hours instead of omeprazole.  Gastroenterology evaluation with Dr. Candace Cruise; she will be seen by Dr. Candace Cruise tomorrow.  2.  Mild dehydration. The patient's Lasix will be stopped and continue IV hydration.  3.  Malignant hypertension. The patient's blood pressure was high on arrival, and I will continue amlodipine 5 mg and use hydralazine 20 mg IV q.4 hours p.r.n. for systolic blood pressure more than 160.  4.  History of chronic obstructive pulmonary disease. No wheezing. Continue albuterol and also will start her on Advair.  5.  Hypothyroidism. Continue Synthroid.  6.  Chronic right-sided abdominal pain. She is on hyoscyamine.   TIME SPENT ON HISTORY AND PHYSICAL: 55 minutes.   ____________________________ Epifanio Lesches, MD sk:np D: 04/28/2013 18:05:43 ET T: 04/28/2013 19:16:22 ET JOB#: [id number]  cc: Epifanio Lesches, MD, <Dictator> Epifanio Lesches MD ELECTRONICALLY SIGNED 05/16/2013 13:48

## 2014-09-22 NOTE — Consult Note (Signed)
EGD showed no esophagitis, large hiatal hernia, linear ulcers in body, and few AVM's, which were cauterized. Continue daily PPI. Resume diet. Moniter hgb.   Electronic Signatures: Verdie Shire (MD)  (Signed on 28-Nov-14 11:34)  Authored  Last Updated: 28-Nov-14 11:34 by Verdie Shire (MD)

## 2014-09-22 NOTE — Consult Note (Signed)
Brief Consult Note: Diagnosis: melena, recurrent.   Patient was seen by consultant.   Consult note dictated.   Recommend to proceed with surgery or procedure.   Recommend further assessment or treatment.   Discussed with Attending MD.   Comments: Please see full GI consult # [id number].  Patietn admitted with recurrent melena in the setting of a similar presentation about 6 weeks ago.  EGD at that time show some erosive esophagitis and a large histal hernia.  Hemodynamically stable.  Recommend repeat EGD tomorrow.  I will notrify Dr Candace Cruise who is on call tomorrow.  I have discusssed the risks benefits and complications of egd to include not limited to bleeding infection perforation and sedation and she wishes to proceed.  Electronic Signatures: Loistine Simas (MD)  (Signed [phone] 23:46)  Authored: Brief Consult Note   Last Updated: 27-Nov-14 23:46 by Loistine Simas (MD)

## 2014-09-22 NOTE — Consult Note (Signed)
PATIENT NAME:  Wendy [name], ARBUTHNOT MR#:  [id number] DATE OF BIRTH:  [birth year]  DATE OF CONSULTATION:  04/28/2013  REFERRING PHYSICIAN:   CONSULTING PHYSICIAN:  Lollie Sails, MD  A patient of Dr. Vianne Bulls.  REASON FOR CONSULTATION:  Melena.   HISTORY OF PRESENT ILLNESS:  Wendy [name] is a 74 year old Caucasian female who I see in the outpatient setting.  She states that last night, she began to have some abdominal discomfort followed by 3 black stools.  This occurred about 6:30 p.m.  Overnight, she seemed to do well, but this was repeated then this morning with two further black stools.  There was some associated nausea, but no vomiting.  There is no abdominal pain.  Currently however she states that her lower abdomen is uncomfortable.  She apparently has a history of possible interstitial cystitis and she is being seen by urology in that regard.  She denies any heartburn.  There is some occasional cervical dysphagia.  She has a bowel movement about once a day.  She does have a history of this occurring last month as well in early October.  She did undergo EGD on 03/18/2013 for further evaluation of this problem.  She has had GI bleeding of obscure origin in the past, this being worked up several years ago.  She does have a large hiatal hernia and I have been concerned that she has recurrent erosions/Cameron type lesions that seem to come and go.  On her scope, she was found to have a grade A erosive esophagitis.  The large hiatal hernia was again found.  There was no evidence of ulcer or other lesions.  The esophagus was normal.  The examined duodenum was normal as well.  She was continued on Prilosec, but at a dose 20 mg twice a day.  She is currently hemodynamically stable.   PAST MEDICAL HISTORY: 1.  Hypertension.  2.  Reactive airway disease.  3.  Gastroesophageal reflux with gastritis.  4.  Depression.  5.  Left ventricular hypertrophy, possible history of congestive heart failure.  6.   Obstructive sleep apnea.  7.  Obesity.  8.  History of invasive ductal carcinoma.  9.  Hypothyroidism.  10.  Hiatal hernia as noted.  11.  Kidney stones.  12.  Osteoporosis.   PAST SURGICAL HISTORY:  She has had the following surgeries:   1.  Appendectomy.  2.  Total abdominal hysterectomy.  3.  Right knee surgery.  4.  Bladder surgery.  5.  Herniated disk.  6.  Parathyroid tumor.   7.  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  Her most recent colonoscopy was 09/17/2010 for her personal history of colon polyps.  Four small polyps were removed and she was due to have a repeat in 2017.  She also has some nonbleeding internal hemorrhoids as well as diverticulosis of the sigmoid colon.   PHYSICAL EXAMINATION: VITAL SIGNS:  Temperature 98.2, pulse 76 respirations 17, blood pressure 123/69, pulse ox 97%.  GENERAL:  She is a 74 year old Caucasian female in no acute distress.  She appears quite comfortable.  HEENT:  Normocephalic, atraumatic.  Eyes are anicteric.  Nose, septum midline.  No lesions.  Oropharynx, no lesions.  NECK:  Supple.  No JVD.  HEART:  Regular rate and rhythm without rub or gallop.  LUNGS:  Bilaterally clear.  ABDOMEN:  Soft.  She is tender to palpation mildly in the upper epigastric region as well as across the lower  abdomen.  There are no masses, rebound or organomegaly.  ANORECTAL:  Deferred.  EXTREMITIES:  No clubbing, cyanosis or edema.  NEUROLOGICAL:  Cranial nerves II through XII grossly intact.  Muscle strength bilaterally equal and symmetric.   LABORATORY, DIAGNOSTIC, AND RADIOLOGICAL DATA:  She had a glucose of 90.  BUN of 30, creatinine of 0.99, sodium 141, potassium 3.7, chloride 109, bicarb 25, calcium 8.6.  Hepatic profile was normal.  Hemogram showed a white count of 5.7, H and H 9.9/29.8, platelet count of 231, MCV is 86.  It is of note that her hemoglobin was 10.1 about two weeks ago when seen in [facility].  There  is no imaging studies.   ASSESSMENT:  Recurrent episode of melena.  The patient with recent EGD showing an erosive esophagitis as well as a large hiatal hernia.  My concern is that she may have a Lysbeth Galas type erosion.  She may need to have her hiatal hernia surgically fixed.  Otherwise, this is a repeat episode similar to that she had about a month ago.  There is some minimal epigastric abdominal discomfort.  Again, she did undergo evaluation in regards to GI blood loss several years ago including capsule study.  At this point, I would recommend repeating the EGD tomorrow.  This will have to be done by Dr. Candace Cruise as I will not be available tomorrow.  I would recommend further that if there is no lesion to account for the melena, then the next step would be repeating her capsule study and moving her repeat colonoscopy date up.  The latter to test could likely be done as an outpatient.  I have discussed the risks, benefits, complications of EGD to include but not limited to bleeding, infection, perforation and the risk of sedation and she wishes to proceed.  In the interim, would continue with serial hemoglobins.  Continue IV PPI drip as you are.      ____________________________ Lollie Sails, MD mus:ea D: 04/28/2013 23:43:01 ET T: 04/29/2013 00:27:05 ET JOB#: [id number]  cc: Lollie Sails, MD, <Dictator> Lollie Sails MD ELECTRONICALLY SIGNED 05/09/2013 17:19

## 2014-09-22 NOTE — Consult Note (Signed)
Chief Complaint:  Subjective/Chief Complaint Covering for Dr. Gustavo Lah. Melena yest. Some nausea but no vomiting. Persistent dyspepsia and abd discomfort. For EGD today by me.   VITAL SIGNS/ANCILLARY NOTES: **Vital Signs.:   28-Nov-14 08:16  Vital Signs Type Pre Medication  Temperature Temperature (F) 98.2  Celsius 36.7  Temperature Source oral  Pulse Pulse 70  Respirations Respirations 18  Systolic BP Systolic BP 800  Diastolic BP (mmHg) Diastolic BP (mmHg) 82  Systolic BP Systolic BP 349  Diastolic BP (mmHg) Diastolic BP (mmHg) 179  Systolic BP Systolic BP 150  Diastolic BP (mmHg) Diastolic BP (mmHg) 569  Pulse Ox % Pulse Ox % 97  Pulse Ox Activity Level  At rest  Oxygen Delivery Room Air/ 21 %   Brief Assessment:  GEN no acute distress   Cardiac Regular   Respiratory normal resp effort   Gastrointestinal mild epig tenderness   Lab Results: Routine Hem:  28-Nov-14 05:10   WBC (CBC) 4.9  RBC (CBC)  3.08  Hemoglobin (CBC)  8.9  Hematocrit (CBC)  26.5  Platelet Count (CBC) 197  MCV 86  MCH 28.8  MCHC 33.4  RDW 13.3  Neutrophil % 48.4  Lymphocyte % 38.8  Monocyte % 8.6  Eosinophil % 3.3  Basophil % 0.9  Neutrophil # 2.4  Lymphocyte # 1.9  Monocyte # 0.4  Eosinophil # 0.2  Basophil # 0.0 (Result(s) reported on 29 Apr 2013 at 05:32AM.)   Assessment/Plan:  Assessment/Plan:  Assessment Melena. Abd pain. On PPI. Hx of large hiatal hernia/esophagitis.   Plan For EGD today. If neg, need to arrange for video capsule study as outpt. Thanks.   Electronic Signatures: Verdie Shire (MD)  (Signed [phone] 10:50)  Authored: Chief Complaint, VITAL SIGNS/ANCILLARY NOTES, Brief Assessment, Lab Results, Assessment/Plan   Last Updated: 28-Nov-14 10:50 by Verdie Shire (MD)

## 2014-09-22 NOTE — Discharge Summary (Signed)
PATIENT NAME:  Wendy [name], FEHR MR#:  [id number] DATE OF BIRTH:  09/09/1940  DATE OF ADMISSION:  04/28/2013 DATE OF DISCHARGE:  04/30/2013  PRIMARY CARE PHYSICIAN: Dr. Ellison Hughs  GASTROENTEROLOGIST: Dr. Candace Cruise   FINAL DIAGNOSES: 1.  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  2.  History of esophagitis.  3.  Hypertension.  4.  Hypothyroidism.   MEDICATIONS ON DISCHARGE: Include amlodipine 5 mg daily, Qvar 80 mcg per inhalations 2 puffs twice a day, Lasix 20 mg every other day, losartan 50 mg daily, Hyoscyamine 0.375, one tablet daily, omeprazole 20 mg twice a day, Nitrostat 0.4 mg sublingually as needed for chest pain, Flonase nasal spray 50 mcg per inhalation 2 sprays each nostril daily, albuterol 1 vial nebulizer 4 times a day as needed for shortness of breath and wheezing, levothyroxine 50 mcg daily, Voltaren topical 1% gel, apply to area 4 times a day as needed for pain, Flexeril 10 mg twice a day, Carafate 1 gram 10 mL 4 times a day before meals and at bedtime.   DIET: Low sodium diet, regular consistency.   ACTIVITY: As tolerated.   REFERRAL: To Dr. Candace Cruise, gastroenterology. Set up appointment to set up a repeat endoscopy, 1 to 2 weeks with Dr. Ellison Hughs.  HOSPITAL COURSE: The patient was admitted on 04/28/2013 and discharged 04/30/2013. She came in with diarrhea and black stool, was admitted with upper gastrointestinal bleed, acute hemorrhagic anemia, recent history of erosive esophagitis. The patient had an upper endoscopy by Dr. Candace Cruise that showed normal esophagus, hiatal hernia, a few recently bleeding angiodysplastic lesions in the stomach treated with thermal therapy and gastric ulcers.   LABORATORY AND RADIOLOGICAL DATA DURING THE HOSPITAL COURSE: Included a glucose of 90, BUN 30, creatinine 0.99, sodium 141, potassium 3.7, chloride 109, CO2 25, calcium 8.6. Liver function tests normal range. White blood cell count 5.7, hemoglobin and  hematocrit 9.9 and 29.8, platelet count of 231. Urinalysis 1+ leukocyte esterase, otherwise negative. Repeat hemoglobin was 8.9 and repeat hemoglobin after that 9.3.   HOSPITAL COURSE PER PROBLEM LIST:  1.  For the patient's acute blood loss anemia, epigastric pain, she was found to have gastric ulcers and angiodysplasia lesions which were cauterized by endoscopy. Hemoglobin remained stable. The patient was initially started on Protonix drip while here and switched over to b.i.d. PPI. The patient has omeprazole b.i.d. at home. I added Carafate. Close clinical follow-up as an outpatient needed. The patient will call the GI office to set up a follow-up endoscopy in the future.  2.  History of esophagitis. Esophagitis looks like it has healed on this endoscopy.  3.  Hypertension. Blood pressure stable on amlodipine, losartan and Lasix every other day.  4.  Hypothyroidism. The patient is on levothyroxine.    TIME SPENT ON DISCHARGE: 35 minutes    ____________________________ Tana Conch. Leslye Peer, MD rjw:dp D: 04/30/2013 14:49:01 ET T: 04/30/2013 15:03:01 ET JOB#: [id number]  cc: Tana Conch. Leslye Peer, MD, <Dictator> Sofie Hartigan, MD Lupita Dawn. Candace Cruise, MD  Marisue Brooklyn MD ELECTRONICALLY SIGNED 05/06/2013 16:01

## 2015-06-06 ENCOUNTER — Emergency Department
Admission: EM | Admit: 2015-06-06 | Discharge: 2015-06-06 | Disposition: A | Discharge status: Home / Self Care | Payer: Medicare Other | Attending: Emergency Medicine | Admitting: Emergency Medicine

## 2015-06-06 DIAGNOSIS — I209 Angina pectoris, unspecified: Secondary | ICD-10-CM | POA: Diagnosis not present

## 2015-06-06 DIAGNOSIS — Z7952 Long term (current) use of systemic steroids: Secondary | ICD-10-CM | POA: Insufficient documentation

## 2015-06-06 DIAGNOSIS — Z7951 Long term (current) use of inhaled steroids: Secondary | ICD-10-CM | POA: Insufficient documentation

## 2015-06-06 DIAGNOSIS — I10 Essential (primary) hypertension: Secondary | ICD-10-CM | POA: Insufficient documentation

## 2015-06-06 DIAGNOSIS — Z91040 Latex allergy status: Secondary | ICD-10-CM | POA: Diagnosis not present

## 2015-06-06 DIAGNOSIS — Z791 Long term (current) use of non-steroidal anti-inflammatories (NSAID): Secondary | ICD-10-CM | POA: Insufficient documentation

## 2015-06-06 DIAGNOSIS — R079 Chest pain, unspecified: Secondary | ICD-10-CM | POA: Diagnosis present

## 2015-06-06 DIAGNOSIS — Z79899 Other long term (current) drug therapy: Secondary | ICD-10-CM | POA: Diagnosis not present

## 2015-06-06 DIAGNOSIS — I208 Other forms of angina pectoris: Secondary | ICD-10-CM

## 2015-06-06 DIAGNOSIS — Z792 Long term (current) use of antibiotics: Secondary | ICD-10-CM | POA: Insufficient documentation

## 2015-06-06 DIAGNOSIS — J45901 Unspecified asthma with (acute) exacerbation: Secondary | ICD-10-CM | POA: Diagnosis not present

## 2015-06-06 LAB — TROPONIN I: Troponin I: <0.03 ng/mL (ref <0.031)

## 2015-06-06 LAB — CBC WITH DIFFERENTIAL/PLATELET
BASOS ABS: 0.1 10*3/uL (ref 0–0.1)
BASOS PCT: 1 %
EOS ABS: 0 10*3/uL (ref 0–0.7)
Eosinophils Relative: 0 %
HCT: 40.2 % (ref 35.0–47.0)
Hemoglobin: 13.6 g/dL (ref 12.0–16.0)
LYMPHS PCT: 39 %
Lymphs Abs: 4.5 10*3/uL — ABNORMAL HIGH (ref 1.0–3.6)
MCH: 31.9 pg (ref 26.0–34.0)
MCHC: 33.8 g/dL (ref 32.0–36.0)
MCV: 94.5 fL (ref 80.0–100.0)
MONO ABS: 1 10*3/uL — AB (ref 0.2–0.9)
Monocytes Relative: 9 %
NEUTROS PCT: 51 %
Neutro Abs: 5.9 10*3/uL (ref 1.4–6.5)
PLATELETS: 229 10*3/uL (ref 150–440)
RBC: 4.25 MIL/uL (ref 3.80–5.20)
RDW: 12.7 % (ref 11.5–14.5)
WBC: 11.5 10*3/uL — AB (ref 3.6–11.0)

## 2015-06-06 LAB — COMPREHENSIVE METABOLIC PANEL
ALK PHOS: 57 U/L (ref 38–126)
ALT: 31 U/L (ref 14–54)
ANION GAP: 9 (ref 5–15)
AST: 20 U/L (ref 15–41)
Albumin: 4.1 g/dL (ref 3.5–5.0)
BILIRUBIN TOTAL: 0.7 mg/dL (ref 0.3–1.2)
BUN: 28 mg/dL — ABNORMAL HIGH (ref 6–20)
CALCIUM: 9.2 mg/dL (ref 8.9–10.3)
CO2: 28 mmol/L (ref 22–32)
Chloride: 103 mmol/L (ref 101–111)
Creatinine, Ser: 1.03 mg/dL — ABNORMAL HIGH (ref 0.44–1.00)
GFR, EST NON AFRICAN AMERICAN: 53 mL/min — AB (ref >60)
Glucose, Bld: 92 mg/dL (ref 65–99)
POTASSIUM: 3.7 mmol/L (ref 3.5–5.1)
Sodium: 140 mmol/L (ref 135–145)
TOTAL PROTEIN: 6.7 g/dL (ref 6.5–8.1)

## 2015-06-06 LAB — LIPASE, BLOOD: LIPASE: 32 U/L (ref 11–51)

## 2015-06-06 MED ORDER — CYANOCOBALAMIN 1000 MCG/ML IJ SOLN
1000.0000 ug | INTRAMUSCULAR | Status: AC
  Administered 2015-06-06: 1000 ug via INTRAMUSCULAR
  Filled 2015-06-06: qty 1

## 2015-06-06 NOTE — Discharge Instructions (Signed)
Angina Pectoris  Angina pectoris, often called angina, is extreme discomfort in the chest, neck, or arm. This is caused by a lack of blood in the middle and thickest layer of the heart wall (myocardium). There are four types of angina:  · Stable angina. Stable angina usually occurs in episodes of predictable frequency and duration. It is usually brought on by physical activity, stress, or excitement. Stable angina usually lasts a few minutes and can often be relieved by a medicine that you place under your tongue. This medicine is called sublingual nitroglycerin.  · Unstable angina. Unstable angina can occur even when you are doing little or no physical activity. It can even occur while you are sleeping or when you are at rest. It can suddenly increase in severity or frequency. It may not be relieved by sublingual nitroglycerin, and it can last up to 30 minutes.  · Microvascular angina. This type of angina is caused by a disorder of tiny blood vessels called arterioles. Microvascular angina is more common in women. The pain may be more severe and last longer than other types of angina pectoris.  · Prinzmetal or variant angina. This type of angina pectoris is rare and usually occurs when you are doing little or no physical activity. It especially occurs in the early morning hours.  CAUSES  Atherosclerosis is the cause of angina. This is the buildup of fat and cholesterol (plaque) on the inside of the arteries. Over time, the plaque may narrow or block the artery, and this will lessen blood flow to the heart. Plaque can also become weak and break off within a coronary artery to form a clot and cause a sudden blockage.  RISK FACTORS  Risk factors common to both men and women include:  · High cholesterol levels.  · High blood pressure (hypertension).  · Tobacco use.  · Diabetes.  · Family history of angina.  · Obesity.  · Lack of exercise.  · A diet high in saturated fats.  Women are at greater risk for angina if they  are:  · Over age 55.  · Postmenopausal.  SYMPTOMS  Many people do not experience any symptoms during the early stages of angina. As the condition progresses, symptoms common to both men and women may include:  · Chest pain.    The pain can be described as a crushing or squeezing in the chest, or a tightness, pressure, fullness, or heaviness in the chest.    The pain can last more than a few minutes, or it can stop and recur.  · Pain in the arms, neck, jaw, or back.  · Unexplained heartburn or indigestion.  · Shortness of breath.  · Nausea.  · Sudden cold sweats.  · Sudden light-headedness.  Many women have chest discomfort and some of the other symptoms. However, women often have different (atypical) symptoms, such as:   · Fatigue.  · Unexplained feelings of nervousness or anxiety.  · Unexplained weakness.  · Dizziness or fainting.  Sometimes, women may have angina without any symptoms.  DIAGNOSIS   Tests to diagnose angina may include:  · ECG (electrocardiogram).  · Exercise stress test. This looks for signs of blockage when the heart is being exercised.  · Pharmacologic stress test. This test looks for signs of blockage when the heart is being stressed with a medicine.  · Blood tests.  · Coronary angiogram. This is a procedure to look at the coronary arteries to see if there is any blockage.    TREATMENT   The treatment of angina may include the following:  · Healthy behavioral changes to reduce or control risk factors.  · Medicine.  · Coronary stenting. A stent helps to keep an artery open.  · Coronary angioplasty. This procedure widens a narrowed or blocked artery.  · Coronary artery bypass surgery. This will allow your blood to pass the blockage (bypass) to reach your heart.  HOME CARE INSTRUCTIONS   · Take medicines only as directed by your health care provider.  · Do not take the following medicines unless your health care provider approves:    Nonsteroidal anti-inflammatory drugs (NSAIDs), such as ibuprofen,  naproxen, or celecoxib.    Vitamin supplements that contain vitamin A, vitamin E, or both.    Hormone replacement therapy that contains estrogen with or without progestin.  · Manage other health conditions such as hypertension and diabetes as directed by your health care provider.  · Follow a heart-healthy diet. A dietitian can help to educate you about healthy food options and changes.  · Use healthy cooking methods such as roasting, grilling, broiling, baking, poaching, steaming, or stir-frying. Talk to a dietitian to learn more about healthy cooking methods.  · Follow an exercise program approved by your health care provider.  · Maintain a healthy weight. Lose weight as approved by your health care provider.  · Plan rest periods when fatigued.  · Learn to manage stress.  · Do not use any tobacco products, including cigarettes, chewing tobacco, or electronic cigarettes. If you need help quitting, ask your health care provider.  · If you drink alcohol, and your health care provider approves, limit your alcohol intake to no more than 1 drink per day. One drink equals 12 ounces of beer, 5 ounces of wine, or 1½ ounces of hard liquor.  · Stop illegal drug use.  · Keep all follow-up visits as directed by your health care provider. This is important.  SEEK IMMEDIATE MEDICAL CARE IF:   · You have pain in your chest, neck, arm, jaw, stomach, or back that lasts more than a few minutes, is recurring, or is unrelieved by taking sublingual nitroglycerin.  · You have profuse sweating without cause.  · You have unexplained:    Heartburn or indigestion.    Shortness of breath or difficulty breathing.    Nausea or vomiting.    Fatigue.    Feelings of nervousness or anxiety.    Weakness.    Diarrhea.  · You have sudden light-headedness or dizziness.  · You faint.  These symptoms may represent a serious problem that is an emergency. Do not wait to see if the symptoms will go away. Get medical help right away. Call your local  emergency services (911 in the [place] Do not drive yourself to the hospital.     This information is not intended to replace advice given to you by your health care provider. Make sure you discuss any questions you have with your health care provider.     Document Released: 05/19/2005 Document Revised: 06/09/2014 Document Reviewed: 09/20/2013  [employer] Interactive Patient Education ©2016 [employer]

## 2015-06-06 NOTE — ED Provider Notes (Signed)
[facility] Emergency Department Provider Note  ____________________________________________  Time seen: 10:40 AM  I have reviewed the triage vital signs and the nursing notes.   HISTORY  Chief Complaint Chest Pain    HPI Wendy [name] is a 75 y.o. female who complains of chest pain that started just before 9:00 AM today. She has a history of same due to angina pectoris that she takes nitroglycerin glycerin 4. She was sitting down at the time when she had a sudden onset of central chest pressure radiating to her jaw. No diaphoresis shortness of breath or vomiting. She tachycardia dizzy or pass out. She took 3 nitroglycerin with complete resolution of the pain. She has been very stressed recently due to her husband being in hospice and her recently having an acute bronchitis. She was actually just dialing the phone to call hospitalist to ask them to keep her husband there a few more days while she continues to recover, which she was very stressed about, when the chest pain started. No exertional or pleuritic symptoms.     Past Medical History  Diagnosis Date  . Anemia   . Arthritis   . Asthma   . Blood transfusion   . Cancer ([place])     right breast  . Hypertension   . Hyperlipidemia      Patient Active Problem List   Diagnosis Date Noted  . 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 SPOTTED FEVERS 10/22/2009  . FATIGUE 10/22/2009  . ARM PAIN, RIGHT 08/20/2009  . CHEST PAIN UNSPECIFIED 08/20/2009  . ESCHERICHIA COLI INFECTION IN CCE & UNS SITE 07/19/2009  . NECK PAIN, ACUTE 07/19/2009  . INF MICROORG RESIST-OTH SPEC RX NO RESIST MX RX 07/19/2009  . CHRONIC AIRWAY OBSTRUCTION NEC 06/18/2009  . URINARY TRACT INFECTION, ACUTE, RECURRENT 06/18/2009  . DYSURIA 06/18/2009  . HYPERCALCEMIA 03/27/2007  . CARDIOMYOPATHY, HYPERTROPHIC 03/27/2007  . ASTHMATIC BRONCHITIS, ACUTE 03/27/2007  . ALLERGIC RHINITIS 03/27/2007  . REFLUX,  ESOPHAGEAL 03/27/2007  . DEGENERATIVE DISC DISEASE 03/27/2007     Past Surgical History  Procedure Laterality Date  . Mastectomy      right  . Appendectomy    . Hernia repair    . Oophorectomy      right  . Abdominal hysterectomy    . Knee surgery      right knee  . Bladder surgery    . Foot surgery    . Kidney stone surgery    . Colon surgery    . Back surgery    . Parathyroidectomy       Current Outpatient Rx  Name  Route  Sig  Dispense  Refill  . albuterol (PROVENTIL) (2.5 MG/3ML) 0.083% nebulizer solution   Nebulization   Take 2.5 mg by nebulization as needed.           Marland Kitchen amLODipine (NORVASC) 5 MG tablet   Oral   Take 5 mg by mouth daily.         . beclomethasone (QVAR) 80 MCG/ACT inhaler   Inhalation   Inhale 2 puffs into the lungs as needed.          . Cyanocobalamin (B-12) 1000 MCG/ML KIT   Injection   Inject 1 Dose as directed every 30 (thirty) days.         . diclofenac sodium (VOLTAREN) 1 % GEL   Topical   Apply 2 g topically 4 (four) times daily.         Marland Kitchen  doxycycline (VIBRAMYCIN) 100 MG capsule   Oral   Take 100 mg by mouth daily.         . fluticasone (FLONASE) 50 MCG/ACT nasal spray               . furosemide (LASIX) 40 MG tablet   Oral   Take 40 mg by mouth daily.           . hyoscyamine (LEVBID) 0.375 MG 12 hr tablet   Oral   Take 0.375 mg by mouth 2 (two) times daily.           Marland Kitchen levothyroxine (SYNTHROID, LEVOTHROID) 50 MCG tablet   Oral   Take 50 mcg by mouth daily.           Marland Kitchen losartan (COZAAR) 50 MG tablet   Oral   Take 50 mg by mouth daily.         . nitroGLYCERIN (NITROSTAT) 0.4 MG SL tablet   Sublingual   Place 0.4 mg under the tongue every 5 (five) minutes as needed for chest pain.         . NON FORMULARY      oxygen         . omeprazole (PRILOSEC) 20 MG capsule   Oral   Take 20 mg by mouth 2 (two) times daily before a meal.         . predniSONE (DELTASONE) 10 MG tablet   Oral    Take 10 mg by mouth daily with breakfast.            Allergies Codeine; Latex; Nitrofurantoin; and Oxycodone hcl   Family History  Problem Relation Age of Onset  . Cancer Mother     unaware of what kind    Social History Social History  Substance Use Topics  . Smoking status: Never Smoker   . Smokeless tobacco: None  . Alcohol Use: No    Review of Systems  Constitutional:   No fever or chills. No weight changes Eyes:   No blurry vision or double vision.  ENT:   No sore throat. Cardiovascular:   Chest pain as above Respiratory:   No dyspnea or cough. Gastrointestinal:   Negative for abdominal pain, vomiting and diarrhea.  No BRBPR or melena. Genitourinary:   Negative for dysuria, urinary retention, bloody urine, or difficulty urinating. Musculoskeletal:   Negative for back pain. No joint swelling or pain. Skin:   Negative for rash. Neurological:   Negative for headaches, focal weakness or numbness. Psychiatric:  No anxiety or depression.   Endocrine:  No hot/cold intolerance, changes in energy, or sleep difficulty.  10-point ROS otherwise negative.  ____________________________________________   PHYSICAL EXAM:  VITAL SIGNS: ED Triage Vitals  Enc Vitals Group     BP 06/06/15 1031 182/80 mmHg     Pulse Rate 06/06/15 1031 64     Resp 06/06/15 1031 13     Temp 06/06/15 1034 97.7 F (36.5 C)     Temp src --      SpO2 06/06/15 1031 98 %     Weight 06/06/15 1032 210 lb (95.255 kg)     Height 06/06/15 1032 5\' 6"  (1.676 m)     Head Cir --      Peak Flow --      Pain Score --      Pain Loc --      Pain Edu? --      Excl. in GC? --     Vital  signs reviewed, nursing assessments reviewed.   Constitutional:   Alert and oriented. Well appearing and in no distress. Eyes:   No scleral icterus. No conjunctival pallor. PERRL. EOMI ENT   Head:   Normocephalic and atraumatic.   Nose:   No congestion/rhinnorhea. No septal hematoma   Mouth/Throat:   MMM,  no pharyngeal erythema. No peritonsillar mass. No uvula shift.   Neck:   No stridor. No SubQ emphysema. No meningismus. Hematological/Lymphatic/Immunilogical:   No cervical lymphadenopathy. Cardiovascular:   RRR. Normal and symmetric distal pulses are present in all extremities. No murmurs, rubs, or gallops. Respiratory:   Normal respiratory effort without tachypnea nor retractions. Breath sounds are clear and equal bilaterally. There is inducible wheezing and bronchospasm with forceful expiration.  Gastrointestinal:   Soft and nontender. No distention. There is no CVA tenderness.  No rebound, rigidity, or guarding. Genitourinary:   deferred Musculoskeletal:   Nontender with normal range of motion in all extremities. No joint effusions.  No lower extremity tenderness.  No edema. Neurologic:   Normal speech and language.  CN 2-10 normal. Motor grossly intact. No pronator drift.  Normal gait. No gross focal neurologic deficits are appreciated.  Skin:    Skin is warm, dry and intact. No rash noted.  No petechiae, purpura, or bullae. Psychiatric:   Mood and affect are normal. Speech and behavior are normal. Patient exhibits appropriate insight and judgment.  ____________________________________________    LABS (pertinent positives/negatives) (all labs ordered are listed, but only abnormal results are displayed) Labs Reviewed  COMPREHENSIVE METABOLIC PANEL - Abnormal; Notable for the following:    BUN 28 (*)    Creatinine, Ser 1.03 (*)    GFR calc non Af Amer 53 (*)    All other components within normal limits  CBC WITH DIFFERENTIAL/PLATELET - Abnormal; Notable for the following:    WBC 11.5 (*)    Lymphs Abs 4.5 (*)    Monocytes Absolute 1.0 (*)    All other components within normal limits  LIPASE, BLOOD  TROPONIN I  TROPONIN I  PATHOLOGIST SMEAR REVIEW   ____________________________________________   EKG  Interpreted by me  Date: 06/06/2015  Rate: 62  Rhythm: normal  sinus rhythm  QRS Axis: normal  Intervals: normal  ST/T Wave abnormalities: normal  Conduction Disutrbances: none  Narrative Interpretation: unremarkable      ____________________________________________    RADIOLOGY    ____________________________________________   PROCEDURES   ____________________________________________   INITIAL IMPRESSION / ASSESSMENT AND PLAN / ED COURSE  Pertinent labs & imaging results that were available during my care of the patient were reviewed by me and considered in my medical decision making (see chart for details).  Patient presents with chest pain at home consistent with angina likely due to emotional stress and her bronchitis. Resolved prior to arrival in the emergency department. Normal EKG. We'll check 2 troponins and discharge home if negative.  ----------------------------------------- 3:14 PM on 06/06/2015 -----------------------------------------  Vital signs remained stable. No recurrence of pain. Troponin negative 2. We'll discharge home to follow up with primary care and cardiology.     ____________________________________________   FINAL CLINICAL IMPRESSION(S) / ED DIAGNOSES  Final diagnoses:  Stable angina ([place])      Carrie Mew, MD 06/06/15 [phone]

## 2015-06-06 NOTE — ED Notes (Signed)
Pt arrived via EMS from home with C/O of central chest pain with radiation to jaw. Took 3 nitro at home and currently no pain. Reports she has a history of chest pains

## 2015-06-08 LAB — PATHOLOGIST SMEAR REVIEW

## 2016-07-30 ENCOUNTER — Ambulatory Visit (INDEPENDENT_AMBULATORY_CARE_PROVIDER_SITE_OTHER): Payer: Medicare Other

## 2016-07-30 ENCOUNTER — Ambulatory Visit
Admission: EM | Admit: 2016-07-30 | Discharge: 2016-07-30 | Disposition: A | Discharge status: Home / Self Care | Payer: Medicare Other | Attending: Family Medicine | Admitting: Family Medicine

## 2016-07-30 DIAGNOSIS — I509 Heart failure, unspecified: Secondary | ICD-10-CM | POA: Diagnosis not present

## 2016-07-30 DIAGNOSIS — E039 Hypothyroidism, unspecified: Secondary | ICD-10-CM | POA: Diagnosis not present

## 2016-07-30 DIAGNOSIS — J189 Pneumonia, unspecified organism: Secondary | ICD-10-CM | POA: Diagnosis not present

## 2016-07-30 DIAGNOSIS — J181 Lobar pneumonia, unspecified organism: Secondary | ICD-10-CM

## 2016-07-30 DIAGNOSIS — I110 Hypertensive heart disease with heart failure: Secondary | ICD-10-CM | POA: Diagnosis not present

## 2016-07-30 DIAGNOSIS — Z91040 Latex allergy status: Secondary | ICD-10-CM | POA: Diagnosis not present

## 2016-07-30 DIAGNOSIS — Z888 Allergy status to other drugs, medicaments and biological substances status: Secondary | ICD-10-CM | POA: Insufficient documentation

## 2016-07-30 DIAGNOSIS — Z79899 Other long term (current) drug therapy: Secondary | ICD-10-CM | POA: Diagnosis not present

## 2016-07-30 DIAGNOSIS — G4730 Sleep apnea, unspecified: Secondary | ICD-10-CM | POA: Insufficient documentation

## 2016-07-30 DIAGNOSIS — J440 Chronic obstructive pulmonary disease with acute lower respiratory infection: Secondary | ICD-10-CM | POA: Insufficient documentation

## 2016-07-30 DIAGNOSIS — R0602 Shortness of breath: Secondary | ICD-10-CM | POA: Diagnosis present

## 2016-07-30 DIAGNOSIS — Z7951 Long term (current) use of inhaled steroids: Secondary | ICD-10-CM | POA: Insufficient documentation

## 2016-07-30 DIAGNOSIS — E785 Hyperlipidemia, unspecified: Secondary | ICD-10-CM | POA: Insufficient documentation

## 2016-07-30 DIAGNOSIS — Z885 Allergy status to narcotic agent status: Secondary | ICD-10-CM | POA: Insufficient documentation

## 2016-07-30 DIAGNOSIS — Z853 Personal history of malignant neoplasm of breast: Secondary | ICD-10-CM | POA: Diagnosis not present

## 2016-07-30 DIAGNOSIS — K219 Gastro-esophageal reflux disease without esophagitis: Secondary | ICD-10-CM | POA: Diagnosis not present

## 2016-07-30 HISTORY — DX: Gastro-esophageal reflux disease without esophagitis: K21.9

## 2016-07-30 HISTORY — DX: Rheumatic mitral valve disease, unspecified: I05.9

## 2016-07-30 HISTORY — DX: Chronic obstructive pulmonary disease, unspecified: J44.9

## 2016-07-30 HISTORY — DX: Heart failure, unspecified: I50.9

## 2016-07-30 HISTORY — DX: Sleep apnea, unspecified: G47.30

## 2016-07-30 HISTORY — DX: Disorder of thyroid, unspecified: E07.9

## 2016-07-30 HISTORY — DX: Angina pectoris, unspecified: I20.9

## 2016-07-30 HISTORY — DX: Other chronic cystitis without hematuria: N30.20

## 2016-07-30 LAB — CBC WITH DIFFERENTIAL/PLATELET
BASOS ABS: 0.1 10*3/uL (ref 0–0.1)
BASOS PCT: 1 %
Eosinophils Absolute: 0.3 10*3/uL (ref 0–0.7)
Eosinophils Relative: 4 %
HCT: 39.3 % (ref 35.0–47.0)
HEMOGLOBIN: 13.4 g/dL (ref 12.0–16.0)
LYMPHS PCT: 36 %
Lymphs Abs: 2.2 10*3/uL (ref 1.0–3.6)
MCH: 32 pg (ref 26.0–34.0)
MCHC: 34.2 g/dL (ref 32.0–36.0)
MCV: 93.7 fL (ref 80.0–100.0)
Monocytes Absolute: 0.6 10*3/uL (ref 0.2–0.9)
Monocytes Relative: 9 %
NEUTROS PCT: 50 %
Neutro Abs: 3.1 10*3/uL (ref 1.4–6.5)
Platelets: 227 10*3/uL (ref 150–440)
RBC: 4.2 MIL/uL (ref 3.80–5.20)
RDW: 12.6 % (ref 11.5–14.5)
WBC: 6.1 10*3/uL (ref 3.6–11.0)

## 2016-07-30 LAB — BASIC METABOLIC PANEL
ANION GAP: 8 (ref 5–15)
BUN: 21 mg/dL — ABNORMAL HIGH (ref 6–20)
CALCIUM: 9.2 mg/dL (ref 8.9–10.3)
CHLORIDE: 103 mmol/L (ref 101–111)
CO2: 27 mmol/L (ref 22–32)
Creatinine, Ser: 1.11 mg/dL — ABNORMAL HIGH (ref 0.44–1.00)
GFR calc non Af Amer: 48 mL/min — ABNORMAL LOW (ref >60)
GFR, EST AFRICAN AMERICAN: 55 mL/min — AB (ref >60)
Glucose, Bld: 100 mg/dL — ABNORMAL HIGH (ref 65–99)
POTASSIUM: 3.9 mmol/L (ref 3.5–5.1)
Sodium: 138 mmol/L (ref 135–145)

## 2016-07-30 MED ORDER — LEVOFLOXACIN 250 MG PO TABS: ORAL_TABLET | ORAL | 0 refills | Status: DC

## 2016-07-30 MED ORDER — CEFTRIAXONE SODIUM 1 G IJ SOLR
1.0000 g | Freq: Once | INTRAMUSCULAR | Status: AC
Start: 2016-07-30 — End: 2016-07-30
  Administered 2016-07-30: 1 g via INTRAMUSCULAR

## 2016-07-30 NOTE — ED Triage Notes (Addendum)
Pt c/o of shortness of breath and bilateral leg swelling. Walking she gives out of breath. She does have a history of CHF. O2 sat is 98, but she is winded while talking.

## 2016-07-30 NOTE — ED Provider Notes (Addendum)
[facility]    CSN: [id number] Arrival date & time: 07/30/16  1514     History   Chief Complaint Chief Complaint  Patient presents with  . Leg Swelling    HPI Wendy [name] is a 76 y.o. female.   The history is provided by the patient.  Shortness of Breath  Severity:  Mild Onset quality:  Gradual Duration:  5 days Timing:  Constant Progression:  Worsening Chronicity:  New Context comment:  None known Worsened by:  Activity Ineffective treatments:  None tried Associated symptoms: no abdominal pain, no chest pain, no claudication, no cough, no diaphoresis, no ear pain, no fever, no headaches, no hemoptysis, no neck pain, no PND, no rash, no sore throat, no sputum production, no syncope, no swollen glands, no vomiting and no wheezing   Risk factors comment:  H/o COPD, CHF   Past Medical History:  Diagnosis Date  . Anemia   . Angina pectoris ([place])   . Arthritis   . Asthma   . Blood transfusion   . Cancer [facility])    right breast  . CHF (congestive heart failure) ([place])   . Chronic cystitis   . COPD (chronic obstructive pulmonary disease) ([place])   . GERD (gastroesophageal reflux disease)   . Hyperlipidemia   . Hypertension   . Mitral valve disease   . Sleep apnea   . Thyroid disease    hypo    Patient Active Problem List   Diagnosis Date Noted  . 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 SPOTTED FEVERS 10/22/2009  . FATIGUE 10/22/2009  . ARM PAIN, RIGHT 08/20/2009  . CHEST PAIN UNSPECIFIED 08/20/2009  . ESCHERICHIA COLI INFECTION IN CCE & UNS SITE 07/19/2009  . NECK PAIN, ACUTE 07/19/2009  . INF MICROORG RESIST-OTH SPEC RX NO RESIST MX RX 07/19/2009  . CHRONIC AIRWAY OBSTRUCTION NEC 06/18/2009  . URINARY TRACT INFECTION, ACUTE, RECURRENT 06/18/2009  . DYSURIA 06/18/2009  . HYPERCALCEMIA 03/27/2007  . CARDIOMYOPATHY, HYPERTROPHIC 03/27/2007  . ASTHMATIC BRONCHITIS, ACUTE 03/27/2007  . ALLERGIC RHINITIS 03/27/2007  .  REFLUX, ESOPHAGEAL 03/27/2007  . [place] DISEASE 03/27/2007    Past Surgical History:  Procedure Laterality Date  . ABDOMINAL HYSTERECTOMY    . APPENDECTOMY    . BACK SURGERY    . BLADDER SURGERY    . COLON SURGERY    . FOOT SURGERY    . HERNIA REPAIR    . KIDNEY STONE SURGERY    . KNEE SURGERY     right knee  . MASTECTOMY     right  . OOPHORECTOMY     right  . PARATHYROIDECTOMY      OB History    No data available       Home Medications    Prior to Admission medications   Medication Sig Start Date End Date Taking? Authorizing Provider  albuterol (PROVENTIL) (2.5 MG/3ML) 0.083% nebulizer solution Take 2.5 mg by nebulization as needed.     Yes Historical Provider, MD  amLODipine (NORVASC) 5 MG tablet Take 5 mg by mouth daily.   Yes Historical Provider, MD  Cyanocobalamin (B-12) 1000 MCG/ML KIT Inject 1 Dose as directed every 30 (thirty) days.   Yes Historical Provider, MD  diclofenac sodium (VOLTAREN) 1 % GEL Apply 2 g topically 4 (four) times daily.   Yes Historical Provider, MD  doxycycline (VIBRAMYCIN) 100 MG capsule Take 100 mg by mouth daily.   Yes Historical Provider, MD  fluticasone (  FLONASE) 50 MCG/ACT nasal spray  07/08/12  Yes Historical Provider, MD  fluticasone (FLOVENT HFA) 110 MCG/ACT inhaler Inhale into the lungs 2 (two) times daily.   Yes Historical Provider, MD  furosemide (LASIX) 40 MG tablet Take 40 mg by mouth daily.     Yes Historical Provider, MD  hyoscyamine (LEVBID) 0.375 MG 12 hr tablet Take 0.375 mg by mouth 2 (two) times daily.     Yes Historical Provider, MD  levothyroxine (SYNTHROID, LEVOTHROID) 50 MCG tablet Take 50 mcg by mouth daily.     Yes Historical Provider, MD  losartan (COZAAR) 50 MG tablet Take 50 mg by mouth daily.   Yes Historical Provider, MD  nitroGLYCERIN (NITROSTAT) 0.4 MG SL tablet Place 0.4 mg under the tongue every 5 (five) minutes as needed for chest pain.   Yes Historical Provider, MD  omeprazole (PRILOSEC) 20 MG  capsule Take 20 mg by mouth 2 (two) times daily before a meal.   Yes Historical Provider, MD  predniSONE (DELTASONE) 10 MG tablet Take 10 mg by mouth daily with breakfast.   Yes Historical Provider, MD  levofloxacin (LEVAQUIN) 250 MG tablet 2 tabs po once today, then 1 tab po qd for next 6 days 07/30/16   Payton Mccallum, MD  NON FORMULARY oxygen    Historical Provider, MD    Family History Family History  Problem Relation Age of Onset  . Cancer Mother     unaware of what kind    Social History Social History  Substance Use Topics  . Smoking status: Never Smoker  . Smokeless tobacco: Never Used  . Alcohol use No     Allergies   Codeine; Imdur [isosorbide dinitrate]; Latex; Lisinopril; Macrobid [nitrofurantoin macrocrystal]; Nitrofurantoin; and Oxycodone hcl   Review of Systems Review of Systems  Constitutional: Negative for diaphoresis and fever.  HENT: Negative for ear pain and sore throat.   Respiratory: Positive for shortness of breath. Negative for cough, hemoptysis, sputum production and wheezing.   Cardiovascular: Negative for chest pain, claudication, syncope and PND.  Gastrointestinal: Negative for abdominal pain and vomiting.  Musculoskeletal: Negative for neck pain.  Skin: Negative for rash.  Neurological: Negative for headaches.     Physical Exam Triage Vital Signs ED Triage Vitals  Enc Vitals Group     BP 07/30/16 1609 (!) 159/75     Pulse Rate 07/30/16 1609 81     Resp 07/30/16 1609 20     Temp 07/30/16 1609 98.2 F (36.8 C)     Temp Source 07/30/16 1609 Oral     SpO2 07/30/16 1609 98 %     Weight 07/30/16 1608 220 lb (99.8 kg)     Height 07/30/16 1608 5\' 6"  (1.676 m)     Head Circumference --      Peak Flow --      Pain Score 07/30/16 1609 0     Pain Loc --      Pain Edu? --      Excl. in GC? --    No data found.   Updated Vital Signs BP (!) 159/75 (BP Location: Left Arm)   Pulse 81   Temp 98.2 F (36.8 C) (Oral)   Resp 20   Ht 5\' 6"   (1.676 m)   Wt 220 lb (99.8 kg)   SpO2 98%   BMI 35.51 kg/m   Visual Acuity Right Eye Distance:   Left Eye Distance:   Bilateral Distance:    Right Eye Near:   Left Eye  Near:    Bilateral Near:     Physical Exam  Constitutional: She is oriented to person, place, and time. She appears well-developed and well-nourished. No distress.  HENT:  Head: Normocephalic.  Right Ear: External ear normal.  Left Ear: External ear normal.  Nose: Nose normal.  Mouth/Throat: Oropharynx is clear and moist and mucous membranes are normal. No tonsillar exudate.  Eyes: Conjunctivae and EOM are normal. Pupils are equal, round, and reactive to light. Right eye exhibits no discharge. Left eye exhibits no discharge. No scleral icterus.  Neck: Normal range of motion. Neck supple. No JVD present. No tracheal deviation present. No thyromegaly present.  Cardiovascular: Normal rate, regular rhythm, normal heart sounds and intact distal pulses.   No murmur heard. Pulmonary/Chest: Effort normal. No stridor. No respiratory distress. She has wheezes (few diffuse wheezes and rhonchi). She has no rales. She exhibits no tenderness.  Abdominal: Soft. Bowel sounds are normal.  Musculoskeletal: She exhibits edema (mild, 1+ tibial/pedal edema). She exhibits no tenderness.  Lymphadenopathy:    She has no cervical adenopathy.  Neurological: She is alert and oriented to person, place, and time. She has normal reflexes.  Skin: Skin is warm and dry. No rash noted. She is not diaphoretic. No erythema. No pallor.  Nursing note and vitals reviewed.    UC Treatments / Results  Labs (all labs ordered are listed, but only abnormal results are displayed) Labs Reviewed  BASIC METABOLIC PANEL - Abnormal; Notable for the following:       Result Value   Glucose, Bld 100 (*)    BUN 21 (*)    Creatinine, Ser 1.11 (*)    GFR calc non Af Amer 48 (*)    GFR calc Af Amer 55 (*)    All other components within normal limits  CBC  WITH DIFFERENTIAL/PLATELET    EKG  EKG Interpretation None       Radiology Dg Chest 2 View  Result Date: 07/30/2016 CLINICAL DATA:  Five days of shortness of breath with bilateral leg swelling. Exertional dyspnea. History of CHF an asthma-COPD nonsmoker. EXAM: CHEST  2 VIEW COMPARISON:  PA and lateral chest x-ray of March 28, 2012 FINDINGS: The lungs are mildly hyperinflated. There is an infiltrate in the anterior aspect of the right middle lobe. There is a large hiatal hernia. There is no pleural effusion. The heart is top-normal in size. The pulmonary vascularity is normal. There is calcification in the wall of the thoracic aorta. The bony thorax is unremarkable. IMPRESSION: COPD. Right middle lobe pneumonia. Followup PA and lateral chest X-ray is recommended in 3-4 weeks following trial of antibiotic therapy to ensure resolution and exclude underlying malignancy. Large hiatal hernia. Thoracic aortic atherosclerosis. Electronically Signed   By: David  Martinique M.D.   On: 07/30/2016 16:58    Procedures Procedures (including critical care time)  Medications Ordered in UC Medications  cefTRIAXone (ROCEPHIN) injection 1 g (1 g Intramuscular Given 07/30/16 1727)     Initial Impression / Assessment and Plan / UC Course  I have reviewed the triage vital signs and the nursing notes.  Pertinent labs & imaging results that were available during my care of the patient were reviewed by me and considered in my medical decision making (see chart for details).       Final Clinical Impressions(s) / UC Diagnoses   Final diagnoses:  Pneumonia of right middle lobe due to infectious organism [facility])    New Prescriptions Discharge Medication List as of 07/30/2016  5:29 PM    START taking these medications   Details  levofloxacin (LEVAQUIN) 250 MG tablet 2 tabs po once today, then 1 tab po qd for next 6 days, Normal       1. Labs/ekg/x-ray results and diagnosis reviewed with patient 2.  Patient given Rocephin 1gm IM x 1; tolerated well 3. rx as per orders above; reviewed possible side effects, interactions, risks and benefits  4. Recommend supportive treatment with rest, continue chronic home medications including oxygen  5. Follow-up with PCP this week or sooner (here/ED/or with PCP) prn if symptoms worsen or don't improve   Norval Gable, MD 07/30/16 [place], MD 07/30/16 [phone]

## 2016-07-30 NOTE — Discharge Instructions (Signed)
Go to Emergency Department at hospital if any worsening symptoms; otherwise follow up with Primary Care Physician

## 2016-08-06 ENCOUNTER — Encounter: Payer: Self-pay | Admitting: Emergency Medicine

## 2016-08-06 ENCOUNTER — Ambulatory Visit (INDEPENDENT_AMBULATORY_CARE_PROVIDER_SITE_OTHER): Payer: Medicare Other

## 2016-08-06 ENCOUNTER — Ambulatory Visit
Admission: EM | Admit: 2016-08-06 | Discharge: 2016-08-06 | Disposition: A | Discharge status: Home / Self Care | Payer: Medicare Other | Attending: Family Medicine | Admitting: Family Medicine

## 2016-08-06 DIAGNOSIS — R0602 Shortness of breath: Secondary | ICD-10-CM

## 2016-08-06 DIAGNOSIS — J181 Lobar pneumonia, unspecified organism: Secondary | ICD-10-CM

## 2016-08-06 DIAGNOSIS — R05 Cough: Secondary | ICD-10-CM | POA: Diagnosis not present

## 2016-08-06 DIAGNOSIS — J189 Pneumonia, unspecified organism: Secondary | ICD-10-CM

## 2016-08-06 MED ORDER — IPRATROPIUM-ALBUTEROL 0.5-2.5 (3) MG/3ML IN SOLN
3.0000 mL | Freq: Once | RESPIRATORY_TRACT | Status: AC
  Administered 2016-08-06: 3 mL via RESPIRATORY_TRACT

## 2016-08-06 NOTE — ED Triage Notes (Signed)
Patient states that she is being pneumonia a week ago.  Patient finished her antibiotic on Monday.  Patient states that she is improving but still has the SOB and cough.  Patient denies recent fevers.

## 2016-08-06 NOTE — ED Provider Notes (Signed)
[facility]    CSN: [id number] Arrival date & time: 08/06/16  1246     History   Chief Complaint Chief Complaint  Patient presents with  . Shortness of Breath  . Cough    HPI Wendy [name] is a 76 y.o. female.   76 yo female with a right middle lobe pneumonia diagnosed one week ago, presents with a complaint of shortness of breath intermittently. Patient was started on Levaquin last week and states she took her last dose yesterday. States overall feels better however still feeling intermittent shortness of breath and some wheezing.    The history is provided by the patient.  Shortness of Breath  Associated symptoms: cough   Cough  Associated symptoms: shortness of breath     Past Medical History:  Diagnosis Date  . Anemia   . Angina pectoris ([place])   . Arthritis   . Asthma   . Blood transfusion   . Cancer [facility])    right breast  . CHF (congestive heart failure) ([place])   . Chronic cystitis   . COPD (chronic obstructive pulmonary disease) ([place])   . GERD (gastroesophageal reflux disease)   . Hyperlipidemia   . Hypertension   . Mitral valve disease   . Sleep apnea   . Thyroid disease    hypo    Patient Active Problem List   Diagnosis Date Noted  . 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 SPOTTED FEVERS 10/22/2009  . FATIGUE 10/22/2009  . ARM PAIN, RIGHT 08/20/2009  . CHEST PAIN UNSPECIFIED 08/20/2009  . ESCHERICHIA COLI INFECTION IN CCE & UNS SITE 07/19/2009  . NECK PAIN, ACUTE 07/19/2009  . INF MICROORG RESIST-OTH SPEC RX NO RESIST MX RX 07/19/2009  . CHRONIC AIRWAY OBSTRUCTION NEC 06/18/2009  . URINARY TRACT INFECTION, ACUTE, RECURRENT 06/18/2009  . DYSURIA 06/18/2009  . HYPERCALCEMIA 03/27/2007  . CARDIOMYOPATHY, HYPERTROPHIC 03/27/2007  . ASTHMATIC BRONCHITIS, ACUTE 03/27/2007  . ALLERGIC RHINITIS 03/27/2007  . REFLUX, ESOPHAGEAL 03/27/2007  . [place] DISEASE 03/27/2007    Past Surgical History:    Procedure Laterality Date  . ABDOMINAL HYSTERECTOMY    . APPENDECTOMY    . BACK SURGERY    . BLADDER SURGERY    . COLON SURGERY    . FOOT SURGERY    . HERNIA REPAIR    . KIDNEY STONE SURGERY    . KNEE SURGERY     right knee  . MASTECTOMY     right  . OOPHORECTOMY     right  . PARATHYROIDECTOMY      OB History    No data available       Home Medications    Prior to Admission medications   Medication Sig Start Date End Date Taking? Authorizing Provider  albuterol (PROVENTIL) (2.5 MG/3ML) 0.083% nebulizer solution Take 2.5 mg by nebulization as needed.      Historical Provider, MD  amLODipine (NORVASC) 5 MG tablet Take 5 mg by mouth daily.    Historical Provider, MD  Cyanocobalamin (B-12) 1000 MCG/ML KIT Inject 1 Dose as directed every 30 (thirty) days.    Historical Provider, MD  diclofenac sodium (VOLTAREN) 1 % GEL Apply 2 g topically 4 (four) times daily.    Historical Provider, MD  doxycycline (VIBRAMYCIN) 100 MG capsule Take 100 mg by mouth daily.    Historical Provider, MD  fluticasone Asencion Islam) 50 MCG/ACT nasal spray  07/08/12   Historical Provider, MD  fluticasone (FLOVENT HFA)  110 MCG/ACT inhaler Inhale into the lungs 2 (two) times daily.    Historical Provider, MD  furosemide (LASIX) 40 MG tablet Take 40 mg by mouth daily.      Historical Provider, MD  hyoscyamine (LEVBID) 0.375 MG 12 hr tablet Take 0.375 mg by mouth 2 (two) times daily.      Historical Provider, MD  levothyroxine (SYNTHROID, LEVOTHROID) 50 MCG tablet Take 50 mcg by mouth daily.      Historical Provider, MD  losartan (COZAAR) 50 MG tablet Take 50 mg by mouth daily.    Historical Provider, MD  nitroGLYCERIN (NITROSTAT) 0.4 MG SL tablet Place 0.4 mg under the tongue every 5 (five) minutes as needed for chest pain.    Historical Provider, MD  NON FORMULARY oxygen    Historical Provider, MD  omeprazole (PRILOSEC) 20 MG capsule Take 20 mg by mouth 2 (two) times daily before a meal.    Historical Provider,  MD    Family History Family History  Problem Relation Age of Onset  . Cancer Mother     unaware of what kind    Social History Social History  Substance Use Topics  . Smoking status: Never Smoker  . Smokeless tobacco: Never Used  . Alcohol use No     Allergies   Codeine; Imdur [isosorbide dinitrate]; Latex; Lisinopril; Macrobid [nitrofurantoin macrocrystal]; Nitrofurantoin; and Oxycodone hcl   Review of Systems Review of Systems  Respiratory: Positive for cough and shortness of breath.      Physical Exam Triage Vital Signs ED Triage Vitals  Enc Vitals Group     BP 08/06/16 1349 (!) 150/86     Pulse Rate 08/06/16 1349 78     Resp 08/06/16 1349 17     Temp 08/06/16 1349 98 F (36.7 C)     Temp Source 08/06/16 1349 Oral     SpO2 08/06/16 1349 98 %     Weight 08/06/16 1350 220 lb (99.8 kg)     Height 08/06/16 1350 '5\' 6"'$  (1.676 m)     Head Circumference --      Peak Flow --      Pain Score 08/06/16 1352 0     Pain Loc --      Pain Edu? --      Excl. in [place]? --    No data found.   Updated Vital Signs BP (!) 150/86 (BP Location: Left Arm)   Pulse 78   Temp 98 F (36.7 C) (Oral)   Resp 17   Ht '5\' 6"'$  (1.676 m)   Wt 220 lb (99.8 kg)   SpO2 98%   BMI 35.51 kg/m   Visual Acuity Right Eye Distance:   Left Eye Distance:   Bilateral Distance:    Right Eye Near:   Left Eye Near:    Bilateral Near:     Physical Exam  Constitutional: She appears well-developed and well-nourished. No distress.  HENT:  Head: Normocephalic and atraumatic.  Right Ear: Tympanic membrane, external ear and ear canal normal.  Left Ear: Tympanic membrane, external ear and ear canal normal.  Nose: No mucosal edema, rhinorrhea, nose lacerations, sinus tenderness, nasal deformity, septal deviation or nasal septal hematoma. No epistaxis.  No foreign bodies. Right sinus exhibits no maxillary sinus tenderness and no frontal sinus tenderness. Left sinus exhibits no maxillary sinus  tenderness and no frontal sinus tenderness.  Mouth/Throat: Uvula is midline, oropharynx is clear and moist and mucous membranes are normal. No oropharyngeal exudate.  Eyes: Conjunctivae  and EOM are normal. Pupils are equal, round, and reactive to light. Right eye exhibits no discharge. Left eye exhibits no discharge. No scleral icterus.  Neck: Normal range of motion. Neck supple. No thyromegaly present.  Cardiovascular: Normal rate, regular rhythm and normal heart sounds.   Pulmonary/Chest: Effort normal. No respiratory distress. She has wheezes (mild, diffuse). She has no rales.  Lymphadenopathy:    She has no cervical adenopathy.  Skin: She is not diaphoretic.  Nursing note and vitals reviewed.    UC Treatments / Results  Labs (all labs ordered are listed, but only abnormal results are displayed) Labs Reviewed - No data to display  EKG  EKG Interpretation None       Radiology Dg Chest 2 View  Result Date: 08/06/2016 CLINICAL DATA:  Shortness of breath, pneumonia EXAM: CHEST  2 VIEW COMPARISON:  07/30/2016 FINDINGS: Large hiatal hernia. Previously seen patchy airspace density anteriorly on the lateral view is no longer visualized. No confluent opacities. Heart is upper limits normal in size. No effusions or acute bony abnormality. IMPRESSION: Large hiatal hernia.  No active cardiopulmonary disease. Electronically Signed   By: Rolm Baptise M.D.   On: 08/06/2016 14:38    Procedures Procedures (including critical care time)  Medications Ordered in UC Medications  ipratropium-albuterol (DUONEB) 0.5-2.5 (3) MG/3ML nebulizer solution 3 mL (3 mLs Nebulization Given 08/06/16 1416)     Initial Impression / Assessment and Plan / UC Course  I have reviewed the triage vital signs and the nursing notes.  Pertinent labs & imaging results that were available during my care of the patient were reviewed by me and considered in my medical decision making (see chart for details).        Final Clinical Impressions(s) / UC Diagnoses   Final diagnoses:  Pneumonia of right middle lobe due to infectious organism [facility])    New Prescriptions Discharge Medication List as of 08/06/2016  2:50 PM     1. x-ray results (negative for pneumonia/resolved)  and diagnosis reviewed with patient  2. Given duoneb treatment x 1  3.  Recommend supportive treatment with home medications/inhalers 4. Follow-up prn if symptoms worsen or don't improve   Norval Gable, MD 08/06/16 1616

## 2016-08-06 NOTE — Discharge Instructions (Signed)
Continue and finish current antibiotic

## 2019-03-01 ENCOUNTER — Other Ambulatory Visit: Payer: Self-pay

## 2019-03-01 ENCOUNTER — Ambulatory Visit
Admission: EM | Admit: 2019-03-01 | Discharge: 2019-03-01 | Disposition: A | Discharge status: Other Acute Inpatient Hospital | Payer: Medicare Other

## 2019-03-01 DIAGNOSIS — R5383 Other fatigue: Secondary | ICD-10-CM | POA: Diagnosis not present

## 2019-03-01 DIAGNOSIS — K921 Melena: Secondary | ICD-10-CM | POA: Diagnosis not present

## 2019-03-01 DIAGNOSIS — R1032 Left lower quadrant pain: Secondary | ICD-10-CM | POA: Diagnosis not present

## 2019-03-01 NOTE — Discharge Instructions (Addendum)
Go directly to Emergency room.  °

## 2019-03-01 NOTE — ED Provider Notes (Signed)
[facility] ____________________________________________  Time seen: Approximately 3:24 PM  I have reviewed the triage vital signs and the nursing notes.   HISTORY  Chief Complaint Blood In Stools   HPI Wendy [name] is a 78 y.o. female past medical history of CHF, COPD, CAD, anemia, hypertension, B12 deficiency, presenting for evaluation of fatigue, weakness and black tarry stools.  Patient reports the fatigue has been coming on for the last 3 to 4 days but for the last 3 days she has had black stools.  States walking 25 feet in her yard yesterday and today made her have to then stop and subsequently sit down due to overall fatigue.  Denies any actual chest pain or shortness of breath.  Occasional dizziness sensation.  States this feels consistent with previous GI bleed with blood transfusion history.  Denies known fevers.  Has been having accompanying left lower abdominal pain with this today.  Denies bright red blood.  Intermittent waves of nausea with near vomiting, no actual vomiting.  No alleviating measure.  Denies other aggravating factors.  Sofie Hartigan, MD: PCP    Past Medical History:  Diagnosis Date  . Anemia   . Angina pectoris ([place])   . Arthritis   . Asthma   . Blood transfusion   . Cancer [facility])    right breast  . CHF (congestive heart failure) ([place])   . Chronic cystitis   . COPD (chronic obstructive pulmonary disease) ([place])   . GERD (gastroesophageal reflux disease)   . Hyperlipidemia   . Hypertension   . Mitral valve disease   . Sleep apnea   . Thyroid disease    hypo    Patient Active Problem List   Diagnosis Date Noted  . 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 SPOTTED FEVERS 10/22/2009  . FATIGUE 10/22/2009  . ARM PAIN, RIGHT 08/20/2009  . CHEST PAIN UNSPECIFIED 08/20/2009  . ESCHERICHIA COLI INFECTION IN CCE & UNS SITE 07/19/2009  . NECK PAIN, ACUTE 07/19/2009  . INF MICROORG RESIST-OTH SPEC RX NO  RESIST MX RX 07/19/2009  . CHRONIC AIRWAY OBSTRUCTION NEC 06/18/2009  . URINARY TRACT INFECTION, ACUTE, RECURRENT 06/18/2009  . DYSURIA 06/18/2009  . HYPERCALCEMIA 03/27/2007  . CARDIOMYOPATHY, HYPERTROPHIC 03/27/2007  . ASTHMATIC BRONCHITIS, ACUTE 03/27/2007  . ALLERGIC RHINITIS 03/27/2007  . REFLUX, ESOPHAGEAL 03/27/2007  . [place] DISEASE 03/27/2007    Past Surgical History:  Procedure Laterality Date  . ABDOMINAL HYSTERECTOMY    . APPENDECTOMY    . BACK SURGERY    . BLADDER SURGERY    . COLON SURGERY    . FOOT SURGERY    . HERNIA REPAIR    . KIDNEY STONE SURGERY    . KNEE SURGERY     right knee  . MASTECTOMY     right  . OOPHORECTOMY     right  . PARATHYROIDECTOMY       No current facility-administered medications for this encounter.   Current Outpatient Medications:  .  aspirin EC 81 MG tablet, Take by mouth., Disp: , Rfl:  .  budesonide-formoterol (SYMBICORT) 80-4.5 MCG/ACT inhaler, Take 2 puffs up to 4 times a day as needed for worsening asthma symptoms. If requiring regularly for > 1 week, call your doctor., Disp: , Rfl:  .  doxycycline (VIBRAMYCIN) 100 MG capsule, Take 100 mg by mouth daily., Disp: , Rfl:  .  furosemide (LASIX) 40 MG tablet, Take 40 mg by mouth daily.  ,  Disp: , Rfl:  .  hydrochlorothiazide (HYDRODIURIL) 25 MG tablet, Take by mouth., Disp: , Rfl:  .  hyoscyamine (LEVBID) 0.375 MG 12 hr tablet, Take 0.375 mg by mouth 2 (two) times daily.  , Disp: , Rfl:  .  levothyroxine (SYNTHROID, LEVOTHROID) 50 MCG tablet, Take 50 mcg by mouth daily.  , Disp: , Rfl:  .  losartan (COZAAR) 50 MG tablet, Take 50 mg by mouth daily., Disp: , Rfl:  .  nitroGLYCERIN (NITROSTAT) 0.4 MG SL tablet, Place 0.4 mg under the tongue every 5 (five) minutes as needed for chest pain., Disp: , Rfl:  .  NON FORMULARY, oxygen, Disp: , Rfl:  .  omeprazole (PRILOSEC) 20 MG capsule, Take 20 mg by mouth 2 (two) times daily before a meal., Disp: , Rfl:  .  ferrous sulfate 325  (65 FE) MG tablet, Take by mouth., Disp: , Rfl:  .  metoprolol succinate (TOPROL-XL) 25 MG 24 hr tablet, Take by mouth., Disp: , Rfl:   Allergies Ciprofloxacin, Codeine, Cyclobenzaprine, Imdur [isosorbide dinitrate], Latex, Lisinopril, Macrobid [nitrofurantoin macrocrystal], Nitrofurantoin, Oxycodone hcl, Prednisone, Sulfamethoxazole-trimethoprim, Amoxicillin-pot clavulanate, and Tramadol  Family History  Problem Relation Age of Onset  . Cancer Mother        unaware of what kind    Social History Social History   Tobacco Use  . Smoking status: Never Smoker  . Smokeless tobacco: Never Used  Substance Use Topics  . Alcohol use: No  . Drug use: No    Review of Systems Constitutional: No fever ENT: No sore throat. Cardiovascular: Denies chest pain. Respiratory: Denies shortness of breath. Gastrointestinal: Positive abdominal pain.  As above. Genitourinary: Negative for dysuria. Musculoskeletal: Negative for back pain. Skin: Negative for rash.   ____________________________________________   PHYSICAL EXAM:  VITAL SIGNS: ED Triage Vitals  Enc Vitals Group     BP      Pulse      Resp      Temp      Temp src      SpO2      Weight      Height      Head Circumference      Peak Flow      Pain Score      Pain Loc      Pain Edu?      Excl. in [place]?    Vitals:   03/01/19 1526 03/01/19 1528  BP:  126/71  Pulse:  69  Resp:  15  Temp:  99.1 F (37.3 C)  TempSrc:  Oral  SpO2:  97%  Weight: 218 lb (98.9 kg)   Height: 5\' 6"  (1.676 m)      Constitutional: Alert and oriented.  Appears pale. Eyes: Conjunctivae are normal.  ENT      Head: Normocephalic and atraumatic. Cardiovascular: Normal rate, regular rhythm. Grossly normal heart sounds.  Good peripheral circulation. Respiratory: Normal respiratory effort without tachypnea nor retractions. Breath sounds are clear and equal bilaterally. No wheezes, rales, rhonchi. Gastrointestinal: Mild to moderate left lower  quadrant abdominal tenderness.  Abdomen otherwise soft and nontender.  No CVA tenderness. Musculoskeletal: Minimal bilateral lower extremity edema. Neurologic:  Normal speech and language. Skin:  Skin is warm, dry and intact. No rash noted. Psychiatric: Mood and affect are normal. Speech and behavior are normal. Patient exhibits appropriate insight and judgment   ___________________________________________   LABS (all labs ordered are listed, but only abnormal results are displayed)  Labs Reviewed - No data to display  PROCEDURES Procedures     INITIAL IMPRESSION / ASSESSMENT AND PLAN / ED COURSE  Pertinent labs & imaging results that were available during my care of the patient were reviewed by me and considered in my medical decision making (see chart for details).  Pleasant patient presenting with sister at bedside for evaluation of black tarry stools with fatigue.  Patient history of GI bleed requiring transfusion with similar presentation.  Also with abdominal pain.  Recommend further evaluation emergency room at this time.  Sister states that she will take her directly to [facility].  Patient stable at discharge.  Patient verbalized understanding and agreed to plan.   ____________________________________________   FINAL CLINICAL IMPRESSION(S) / ED DIAGNOSES  Final diagnoses:  Black tarry stools  Fatigue, unspecified type  Left lower quadrant abdominal pain     ED Discharge Orders    None       Note: This dictation was prepared with Dragon dictation along with smaller phrase technology. Any transcriptional errors that result from this process are unintentional.         Marylene Land, NP 03/01/19 1605

## 2019-03-01 NOTE — ED Triage Notes (Signed)
Patient states that she has been dizzy,nausea, has been having black stool and has internal bleeding and fatigue. This started 3 days ago.

## 2019-03-03 MED ORDER — GENERIC EXTERNAL MEDICATION
Status: DC
Start: ? — End: 2019-03-03

## 2019-03-03 MED ORDER — METOPROLOL TARTRATE 25 MG PO TABS
12.50 | ORAL_TABLET | ORAL | Status: DC
Start: 2019-03-03 — End: 2019-03-03

## 2019-03-03 MED ORDER — DEXTROSE 50 % IV SOLN
12.50 | INTRAVENOUS | Status: DC
Start: ? — End: 2019-03-03

## 2019-03-03 MED ORDER — PRAVASTATIN SODIUM 40 MG PO TABS
40.00 | ORAL_TABLET | ORAL | Status: DC
Start: 2019-03-03 — End: 2019-03-03

## 2019-03-03 MED ORDER — DOXYCYCLINE HYCLATE 100 MG PO TABS
100.00 | ORAL_TABLET | ORAL | Status: DC
Start: 2019-03-04 — End: 2019-03-03

## 2019-03-03 MED ORDER — PANTOPRAZOLE SODIUM 40 MG IV SOLR
40.00 | INTRAVENOUS | Status: DC
Start: 2019-03-03 — End: 2019-03-03

## 2019-03-03 MED ORDER — LEVOTHYROXINE SODIUM 50 MCG PO TABS
50.00 | ORAL_TABLET | ORAL | Status: DC
Start: 2019-03-04 — End: 2019-03-03

## 2019-03-03 MED ORDER — ONDANSETRON 4 MG PO TBDP
4.00 | ORAL_TABLET | ORAL | Status: DC
Start: ? — End: 2019-03-03

## 2019-03-03 MED ORDER — FERROUS SULFATE 325 (65 FE) MG PO TABS
325.00 | ORAL_TABLET | ORAL | Status: DC
Start: 2019-03-04 — End: 2019-03-03

## 2021-08-23 DIAGNOSIS — Z9889 Other specified postprocedural states: Secondary | ICD-10-CM

## 2021-08-23 DIAGNOSIS — N1832 Chronic kidney disease, stage 3b: Secondary | ICD-10-CM | POA: Insufficient documentation

## 2022-10-23 ENCOUNTER — Emergency Department: Payer: Medicare Other

## 2022-10-23 ENCOUNTER — Emergency Department
Admission: EM | Admit: 2022-10-23 | Discharge: 2022-10-23 | Disposition: A | Discharge status: Home / Self Care | Payer: Medicare Other | Attending: Emergency Medicine | Admitting: Emergency Medicine

## 2022-10-23 DIAGNOSIS — R110 Nausea: Secondary | ICD-10-CM | POA: Insufficient documentation

## 2022-10-23 DIAGNOSIS — R55 Syncope and collapse: Secondary | ICD-10-CM | POA: Diagnosis not present

## 2022-10-23 DIAGNOSIS — I509 Heart failure, unspecified: Secondary | ICD-10-CM | POA: Diagnosis not present

## 2022-10-23 DIAGNOSIS — N189 Chronic kidney disease, unspecified: Secondary | ICD-10-CM | POA: Insufficient documentation

## 2022-10-23 DIAGNOSIS — R0789 Other chest pain: Secondary | ICD-10-CM | POA: Diagnosis present

## 2022-10-23 DIAGNOSIS — I209 Angina pectoris, unspecified: Secondary | ICD-10-CM | POA: Diagnosis not present

## 2022-10-23 DIAGNOSIS — I130 Hypertensive heart and chronic kidney disease with heart failure and stage 1 through stage 4 chronic kidney disease, or unspecified chronic kidney disease: Secondary | ICD-10-CM | POA: Diagnosis not present

## 2022-10-23 DIAGNOSIS — R079 Chest pain, unspecified: Secondary | ICD-10-CM

## 2022-10-23 LAB — COMPREHENSIVE METABOLIC PANEL
ALT: 29 U/L (ref 0–44)
AST: 25 U/L (ref 15–41)
Albumin: 4.3 g/dL (ref 3.5–5.0)
Alkaline Phosphatase: 57 U/L (ref 38–126)
Anion gap: 13 (ref 5–15)
BUN: 79 mg/dL — ABNORMAL HIGH (ref 8–23)
CO2: 24 mmol/L (ref 22–32)
Calcium: 8.8 mg/dL — ABNORMAL LOW (ref 8.9–10.3)
Chloride: 101 mmol/L (ref 98–111)
Creatinine, Ser: 2.01 mg/dL — ABNORMAL HIGH (ref 0.44–1.00)
GFR, Estimated: 25 mL/min — ABNORMAL LOW (ref >60)
Glucose, Bld: 117 mg/dL — ABNORMAL HIGH (ref 70–99)
Potassium: 3.7 mmol/L (ref 3.5–5.1)
Sodium: 138 mmol/L (ref 135–145)
Total Bilirubin: 0.9 mg/dL (ref 0.3–1.2)
Total Protein: 6.8 g/dL (ref 6.5–8.1)

## 2022-10-23 LAB — CBC
HCT: 36.5 % (ref 36.0–46.0)
Hemoglobin: 12 g/dL (ref 12.0–15.0)
MCH: 32.7 pg (ref 26.0–34.0)
MCHC: 32.9 g/dL (ref 30.0–36.0)
MCV: 99.5 fL (ref 80.0–100.0)
Platelets: 237 10*3/uL (ref 150–400)
RBC: 3.67 MIL/uL — ABNORMAL LOW (ref 3.87–5.11)
RDW: 13 % (ref 11.5–15.5)
WBC: 9.2 10*3/uL (ref 4.0–10.5)
nRBC: 0 % (ref 0.0–0.2)

## 2022-10-23 LAB — TROPONIN I (HIGH SENSITIVITY)
Troponin I (High Sensitivity): 8 ng/L (ref <18)
Troponin I (High Sensitivity): 5 ng/L (ref <18)

## 2022-10-23 NOTE — ED Notes (Signed)
XR at bedside

## 2022-10-23 NOTE — Discharge Instructions (Addendum)
Please follow-up with your cardiologist.  Return to the emergency department for any further chest pain or any other symptom personally concerning to yourself.

## 2022-10-23 NOTE — ED Provider Notes (Signed)
[facility] Provider Note    Event Date/Time   First MD Initiated Contact with Patient 10/23/22 1504     (approximate)  History   Chief Complaint: Chest Pain  HPI  Wendy [name] is a 82 y.o. female with a past medical history of anemia, angina, CHF, COPD, gastric reflux, hypertension, hyperlipidemia presents to the emergency department for chest pain.  According to the patient around 11 AM she developed pain in the center of her chest.  Patient states a history of angina but this usually resolves after 1 or 2 nitroglycerin tablets.  Patient states she continued to have chest pain despite taking 2 nitroglycerin so she took an additional nitro glycerin and then once again for a total of 4 nitroglycerin tablets.  Patient states shortly afterwards she had a near syncopal episode where she passed out or almost passed out.  Patient states she was just discharged from the hospital 2 days ago after CHF exacerbation.  States they are closely monitoring her kidney function.  States they have been diuresing fluid off of the patient since her hospitalization.  Received 500 cc of normal saline with EMS.  Vital signs reassuring, currently satting 100% on room air.  Patient states her chest pain has completely resolved.  Denies any shortness of breath.  States earlier today she did get nauseated.  Physical Exam   Triage Vital Signs: ED Triage Vitals  Enc Vitals Group     BP 10/23/22 1435 (!) 131/46     Pulse Rate 10/23/22 1433 (!) 55     Resp 10/23/22 1433 16     Temp 10/23/22 1433 (!) 97.5 F (36.4 C)     Temp Source 10/23/22 1433 Oral     SpO2 10/23/22 1429 90 %     Weight 10/23/22 1431 220 lb (99.8 kg)     Height 10/23/22 1431 5\' 6"  (1.676 m)     Head Circumference --      Peak Flow --      Pain Score 10/23/22 1431 0     Pain Loc --      Pain Edu? --      Excl. in [place]? --     Most recent vital signs: Vitals:   10/23/22 1600 10/23/22 1620  BP: (!) 149/60    Pulse: (!) 57 (!) 54  Resp: (!) 22 17  Temp:    SpO2: 100% 99%    General: Awake, no distress.  CV:  Good peripheral perfusion.  Regular rate and rhythm  Resp:  Normal effort.  Equal breath sounds bilaterally.  Abd:  No distention.  Soft, nontender.  No rebound or guarding.  ED Results / Procedures / Treatments   EKG  EKG viewed and interpreted by myself shows a normal sinus rhythm at 57 bpm with a narrow QRS, normal axis, normal intervals, no concerning ST changes.  Reassuring EKG.  RADIOLOGY  I have reviewed and interpreted chest x-ray images.  No obvious consolidation. Radiology has read the x-ray as a large hiatal hernia with no other acute finding   MEDICATIONS ORDERED IN ED: Medications - No data to display   IMPRESSION / MDM / ASSESSMENT AND PLAN / ED COURSE  I reviewed the triage vital signs and the nursing notes.  Patient's presentation is most consistent with acute presentation with potential threat to life or bodily function.  Patient presents to the emergency department for chest pain.  Patient states a history of angina but has had increased  angina recently.  Was just discharged from the hospital for CHF exacerbation requiring IV diuresis 2 days ago.  Patient states today she had a near syncopal episode which is what prompted today's ER visit.  Patient states normally she only needs to take 1 or 2 nitroglycerin tablets however this time took 4 nitroglycerin tablets.  The nitroglycerin very likely could have caused the patient's near syncopal episode due to hypotension.  Patient currently denies any symptoms, states she feels at her baseline denies any chest pain.  Denies any shortness of breath at any point.  Patient's workup today shows a normal CBC, reassuring chemistry.  Patient has chronic renal insufficiency with a GFR of 25 today.  Troponin is negative chest x-ray is clear.  We will repeat a troponin as a precaution.  If the patient's repeat troponin is negative  and the patient remains symptom-free anticipate likely discharge home with cardiology follow-up.  Patient is agreeable to this plan as well.  Troponin negative x 2.  Patient remains symptom-free in the emergency department.  Given the patient's reassuring workup I believe the patient will be safe for discharge home with cardiology follow-up.  Patient agreeable to plan of care.  FINAL CLINICAL IMPRESSION(S) / ED DIAGNOSES   Chest pain Angina Near syncope   Note:  This document was prepared using Dragon voice recognition software and may include unintentional dictation errors.   Minna Antis, MD 10/23/22 1754

## 2022-10-23 NOTE — ED Notes (Signed)
Assisted patient to bathroom. Pt ambulated with minimal assistance

## 2022-10-23 NOTE — ED Triage Notes (Signed)
Pt presents to the ED via [facility] from home. Called EMS for CP. Pt took 4 nitro tabs prior to EMS arrival. Pt had a near syncopal episode that she states was from the pain. Pt states that she kept passing out and when she would come to she would take another nitro. Pt states that she was recently hospitalized for CHF and bronchitis. Pt denies pain at this time. Pt given NaCl with EMS.  20g IV left AC HR 45-55 BP 140/100 90% on RA and 98% on 3L.

## 2022-10-23 NOTE — ED Notes (Signed)
Pt verbalizes understanding of discharge instructions. Opportunity for questioning and answers were provided. Pt discharged from ED to home with son.    

## 2023-12-03 ENCOUNTER — Other Ambulatory Visit: Payer: Self-pay | Admitting: Neurosurgery

## 2023-12-03 DIAGNOSIS — M5416 Radiculopathy, lumbar region: Secondary | ICD-10-CM

## 2023-12-09 ENCOUNTER — Ambulatory Visit

## 2023-12-16 ENCOUNTER — Telehealth: Payer: Self-pay

## 2023-12-16 NOTE — Telephone Encounter (Signed)
 Pt called asking about an update on a prior auth for her Ct scan that is scheduled for 12/23/2023. Please advise pt.

## 2023-12-17 NOTE — Telephone Encounter (Signed)
 Pt was advise to get in touch with the provider/clinic that ordered the CT scan. Pt voiced understanding.

## 2023-12-23 ENCOUNTER — Ambulatory Visit
Admission: RE | Admit: 2023-12-23 | Discharge: 2023-12-23 | Disposition: A | Discharge status: Home / Self Care | Source: Ambulatory Visit | Attending: Neurosurgery | Admitting: Neurosurgery

## 2023-12-23 DIAGNOSIS — M5416 Radiculopathy, lumbar region: Secondary | ICD-10-CM | POA: Insufficient documentation

## 2024-04-20 ENCOUNTER — Other Ambulatory Visit: Payer: Self-pay

## 2024-04-20 ENCOUNTER — Inpatient Hospital Stay
Admission: EM | Admit: 2024-04-20 | Discharge: 2024-05-03 | DRG: 871 | Disposition: A | Discharge status: Skilled Nursing Facility | Attending: Family Medicine | Admitting: Family Medicine

## 2024-04-20 DIAGNOSIS — Z9011 Acquired absence of right breast and nipple: Secondary | ICD-10-CM

## 2024-04-20 DIAGNOSIS — Z79899 Other long term (current) drug therapy: Secondary | ICD-10-CM

## 2024-04-20 DIAGNOSIS — K449 Diaphragmatic hernia without obstruction or gangrene: Secondary | ICD-10-CM | POA: Diagnosis present

## 2024-04-20 DIAGNOSIS — Z881 Allergy status to other antibiotic agents status: Secondary | ICD-10-CM

## 2024-04-20 DIAGNOSIS — E876 Hypokalemia: Secondary | ICD-10-CM | POA: Diagnosis present

## 2024-04-20 DIAGNOSIS — I2510 Atherosclerotic heart disease of native coronary artery without angina pectoris: Secondary | ICD-10-CM | POA: Diagnosis present

## 2024-04-20 DIAGNOSIS — Z87440 Personal history of urinary (tract) infections: Secondary | ICD-10-CM

## 2024-04-20 DIAGNOSIS — E872 Acidosis, unspecified: Secondary | ICD-10-CM | POA: Diagnosis not present

## 2024-04-20 DIAGNOSIS — E66812 Obesity, class 2: Secondary | ICD-10-CM | POA: Diagnosis present

## 2024-04-20 DIAGNOSIS — E039 Hypothyroidism, unspecified: Secondary | ICD-10-CM | POA: Diagnosis present

## 2024-04-20 DIAGNOSIS — Z95810 Presence of automatic (implantable) cardiac defibrillator: Secondary | ICD-10-CM

## 2024-04-20 DIAGNOSIS — R042 Hemoptysis: Secondary | ICD-10-CM | POA: Diagnosis not present

## 2024-04-20 DIAGNOSIS — N170 Acute kidney failure with tubular necrosis: Secondary | ICD-10-CM | POA: Diagnosis present

## 2024-04-20 DIAGNOSIS — Z9889 Other specified postprocedural states: Secondary | ICD-10-CM

## 2024-04-20 DIAGNOSIS — Z7951 Long term (current) use of inhaled steroids: Secondary | ICD-10-CM

## 2024-04-20 DIAGNOSIS — E785 Hyperlipidemia, unspecified: Secondary | ICD-10-CM | POA: Diagnosis present

## 2024-04-20 DIAGNOSIS — E890 Postprocedural hypothyroidism: Secondary | ICD-10-CM | POA: Diagnosis present

## 2024-04-20 DIAGNOSIS — K219 Gastro-esophageal reflux disease without esophagitis: Secondary | ICD-10-CM | POA: Diagnosis present

## 2024-04-20 DIAGNOSIS — N179 Acute kidney failure, unspecified: Secondary | ICD-10-CM

## 2024-04-20 DIAGNOSIS — K921 Melena: Principal | ICD-10-CM | POA: Diagnosis present

## 2024-04-20 DIAGNOSIS — I10 Essential (primary) hypertension: Secondary | ICD-10-CM | POA: Diagnosis present

## 2024-04-20 DIAGNOSIS — Z79890 Hormone replacement therapy (postmenopausal): Secondary | ICD-10-CM

## 2024-04-20 DIAGNOSIS — K589 Irritable bowel syndrome without diarrhea: Secondary | ICD-10-CM | POA: Diagnosis present

## 2024-04-20 DIAGNOSIS — J181 Lobar pneumonia, unspecified organism: Secondary | ICD-10-CM

## 2024-04-20 DIAGNOSIS — Z6838 Body mass index (BMI) 38.0-38.9, adult: Secondary | ICD-10-CM

## 2024-04-20 DIAGNOSIS — R55 Syncope and collapse: Secondary | ICD-10-CM

## 2024-04-20 DIAGNOSIS — Z7982 Long term (current) use of aspirin: Secondary | ICD-10-CM

## 2024-04-20 DIAGNOSIS — J440 Chronic obstructive pulmonary disease with acute lower respiratory infection: Secondary | ICD-10-CM | POA: Diagnosis present

## 2024-04-20 DIAGNOSIS — M1990 Unspecified osteoarthritis, unspecified site: Secondary | ICD-10-CM | POA: Diagnosis present

## 2024-04-20 DIAGNOSIS — E8729 Other acidosis: Secondary | ICD-10-CM

## 2024-04-20 DIAGNOSIS — R197 Diarrhea, unspecified: Principal | ICD-10-CM

## 2024-04-20 DIAGNOSIS — N1832 Chronic kidney disease, stage 3b: Secondary | ICD-10-CM | POA: Diagnosis present

## 2024-04-20 DIAGNOSIS — J9601 Acute respiratory failure with hypoxia: Secondary | ICD-10-CM | POA: Diagnosis not present

## 2024-04-20 DIAGNOSIS — J13 Pneumonia due to Streptococcus pneumoniae: Secondary | ICD-10-CM | POA: Diagnosis present

## 2024-04-20 DIAGNOSIS — Z6839 Body mass index (BMI) 39.0-39.9, adult: Secondary | ICD-10-CM

## 2024-04-20 DIAGNOSIS — Z880 Allergy status to penicillin: Secondary | ICD-10-CM

## 2024-04-20 DIAGNOSIS — Z91040 Latex allergy status: Secondary | ICD-10-CM

## 2024-04-20 DIAGNOSIS — R6520 Severe sepsis without septic shock: Secondary | ICD-10-CM | POA: Diagnosis present

## 2024-04-20 DIAGNOSIS — I5032 Chronic diastolic (congestive) heart failure: Secondary | ICD-10-CM | POA: Diagnosis present

## 2024-04-20 DIAGNOSIS — Z853 Personal history of malignant neoplasm of breast: Secondary | ICD-10-CM

## 2024-04-20 DIAGNOSIS — Z809 Family history of malignant neoplasm, unspecified: Secondary | ICD-10-CM

## 2024-04-20 DIAGNOSIS — I5033 Acute on chronic diastolic (congestive) heart failure: Secondary | ICD-10-CM | POA: Diagnosis not present

## 2024-04-20 DIAGNOSIS — E860 Dehydration: Secondary | ICD-10-CM | POA: Diagnosis present

## 2024-04-20 DIAGNOSIS — I130 Hypertensive heart and chronic kidney disease with heart failure and stage 1 through stage 4 chronic kidney disease, or unspecified chronic kidney disease: Secondary | ICD-10-CM | POA: Diagnosis present

## 2024-04-20 DIAGNOSIS — I959 Hypotension, unspecified: Secondary | ICD-10-CM

## 2024-04-20 DIAGNOSIS — G4733 Obstructive sleep apnea (adult) (pediatric): Secondary | ICD-10-CM | POA: Diagnosis present

## 2024-04-20 DIAGNOSIS — A419 Sepsis, unspecified organism: Principal | ICD-10-CM | POA: Diagnosis present

## 2024-04-20 DIAGNOSIS — K641 Second degree hemorrhoids: Secondary | ICD-10-CM | POA: Diagnosis present

## 2024-04-20 DIAGNOSIS — I4891 Unspecified atrial fibrillation: Secondary | ICD-10-CM | POA: Diagnosis present

## 2024-04-20 LAB — CBC
HCT: 36.1 % (ref 36.0–46.0)
Hemoglobin: 11.9 g/dL — ABNORMAL LOW (ref 12.0–15.0)
MCH: 32.1 pg (ref 26.0–34.0)
MCHC: 33 g/dL (ref 30.0–36.0)
MCV: 97.3 fL (ref 80.0–100.0)
Platelets: 291 K/uL (ref 150–400)
RBC: 3.71 MIL/uL — ABNORMAL LOW (ref 3.87–5.11)
RDW: 13.3 % (ref 11.5–15.5)
WBC: 7.8 K/uL (ref 4.0–10.5)
nRBC: 0 % (ref 0.0–0.2)

## 2024-04-20 LAB — COMPREHENSIVE METABOLIC PANEL WITH GFR
ALT: 13 U/L (ref 0–44)
AST: 26 U/L (ref 15–41)
Albumin: 4.2 g/dL (ref 3.5–5.0)
Alkaline Phosphatase: 90 U/L (ref 38–126)
Anion gap: 15 (ref 5–15)
BUN: 27 mg/dL — ABNORMAL HIGH (ref 8–23)
CO2: 28 mmol/L (ref 22–32)
Calcium: 9.4 mg/dL (ref 8.9–10.3)
Chloride: 98 mmol/L (ref 98–111)
Creatinine, Ser: 2.02 mg/dL — ABNORMAL HIGH (ref 0.44–1.00)
GFR, Estimated: 24 mL/min — ABNORMAL LOW (ref >60)
Glucose, Bld: 103 mg/dL — ABNORMAL HIGH (ref 70–99)
Potassium: 2.5 mmol/L — CL (ref 3.5–5.1)
Sodium: 141 mmol/L (ref 135–145)
Total Bilirubin: 0.4 mg/dL (ref 0.0–1.2)
Total Protein: 7.2 g/dL (ref 6.5–8.1)

## 2024-04-20 LAB — TYPE AND SCREEN
ABO/RH(D): O POS
Antibody Screen: NEGATIVE

## 2024-04-20 LAB — MAGNESIUM: Magnesium: 2.3 mg/dL (ref 1.7–2.4)

## 2024-04-20 LAB — LIPASE, BLOOD: Lipase: 42 U/L (ref 11–51)

## 2024-04-20 MED ORDER — POTASSIUM CHLORIDE IN NACL 40-0.9 MEQ/L-% IV SOLN
INTRAVENOUS | Status: DC
  Filled 2024-04-20: qty 1000

## 2024-04-20 MED ORDER — ONDANSETRON HCL 4 MG/2ML IJ SOLN
4.0000 mg | Freq: Four times a day (QID) | INTRAMUSCULAR | Status: DC | PRN
  Administered 2024-04-22 (×2): 4 mg via INTRAVENOUS
  Filled 2024-04-20 (×2): qty 2

## 2024-04-20 MED ORDER — MORPHINE SULFATE (PF) 2 MG/ML IV SOLN
2.0000 mg | INTRAVENOUS | Status: DC | PRN
  Filled 2024-04-20: qty 1

## 2024-04-20 MED ORDER — PRAVASTATIN SODIUM 40 MG PO TABS
80.0000 mg | ORAL_TABLET | Freq: Every day | ORAL | Status: DC
  Administered 2024-04-21 – 2024-05-03 (×13): 80 mg via ORAL
  Filled 2024-04-20: qty 2
  Filled 2024-04-20: qty 4
  Filled 2024-04-20 (×10): qty 2
  Filled 2024-04-20: qty 4

## 2024-04-20 MED ORDER — LEVOTHYROXINE SODIUM 50 MCG PO TABS
50.0000 ug | ORAL_TABLET | Freq: Every day | ORAL | Status: DC
  Administered 2024-04-21 – 2024-05-03 (×12): 50 ug via ORAL
  Filled 2024-04-20 (×15): qty 1

## 2024-04-20 MED ORDER — POTASSIUM CHLORIDE 10 MEQ/100ML IV SOLN: 10.0000 meq | Freq: Once | INTRAVENOUS | Status: DC

## 2024-04-20 MED ORDER — POTASSIUM CHLORIDE 10 MEQ/100ML IV SOLN
10.0000 meq | INTRAVENOUS | Status: DC
  Administered 2024-04-20 – 2024-04-21 (×2): 10 meq via INTRAVENOUS
  Filled 2024-04-20 (×2): qty 100

## 2024-04-20 MED ORDER — HYDROCODONE-ACETAMINOPHEN 5-325 MG PO TABS
1.0000 | ORAL_TABLET | ORAL | Status: DC | PRN
  Administered 2024-04-23: 1 via ORAL
  Administered 2024-04-26 – 2024-04-30 (×5): 2 via ORAL
  Filled 2024-04-20 (×2): qty 2
  Filled 2024-04-20: qty 1
  Filled 2024-04-20 (×3): qty 2

## 2024-04-20 MED ORDER — ACETAMINOPHEN 650 MG RE SUPP: 650.0000 mg | Freq: Four times a day (QID) | RECTAL | Status: DC | PRN

## 2024-04-20 MED ORDER — SODIUM CHLORIDE 0.9% FLUSH
3.0000 mL | Freq: Two times a day (BID) | INTRAVENOUS | Status: DC
  Administered 2024-04-21 – 2024-04-29 (×17): 3 mL via INTRAVENOUS

## 2024-04-20 MED ORDER — ACETAMINOPHEN 325 MG PO TABS
650.0000 mg | ORAL_TABLET | Freq: Four times a day (QID) | ORAL | Status: DC | PRN
  Administered 2024-04-21 – 2024-05-03 (×9): 650 mg via ORAL
  Filled 2024-04-20 (×10): qty 2

## 2024-04-20 MED ORDER — ONDANSETRON HCL 4 MG PO TABS: 4.0000 mg | ORAL_TABLET | Freq: Four times a day (QID) | ORAL | Status: DC | PRN

## 2024-04-20 NOTE — ED Triage Notes (Signed)
 Per EMS, patient began C/O left chest pain starting this evening radiating to left shoulder with a near syncopal episode. Upon EMS arrival, patient hypotensive in the 70/40's. Patient reports watery diarrhea x 3 months that has progressively gotten worse over the last couple days per patient.

## 2024-04-20 NOTE — Assessment & Plan Note (Addendum)
 EGD negative.  Patient was spitting up some bright red blood this morning.

## 2024-04-20 NOTE — Assessment & Plan Note (Addendum)
 Blood pressure on the lower side today had to give a another fluid bolus.

## 2024-04-20 NOTE — ED Notes (Signed)
 Hemoccult card positive at this time.

## 2024-04-20 NOTE — Assessment & Plan Note (Addendum)
 Patient short of breath with ambulation restart Lasix .

## 2024-04-20 NOTE — Assessment & Plan Note (Addendum)
 Replaced

## 2024-04-20 NOTE — ED Notes (Signed)
 Patient cleaned up from watery BM x 4 at this time. New brief placed on patient with chux pads in place. Warm blankets provided for patient at this time.

## 2024-04-20 NOTE — ED Provider Notes (Signed)
 [facility] Provider Note    Event Date/Time   First MD Initiated Contact with Patient 04/20/24 2012     (approximate)   History   Chest Pain, Near Syncope, and Diarrhea   HPI  Wendy [name] is a 83 y.o. female who presents today with concern of syncopal episodes and diarrhea.  Apparently has been dealing with ongoing diarrhea for about 3 months now, but over the last week has been acutely worsened with multiple episodes every day which is just water whenever she tries eating anything.  Today had multiple syncopal episodes as well whenever she would try to walk around she would get lightheaded and nearly passed out, did not actually fall or hit her head.  She does endorse that she has had multiple history of GI bleeds before in the past.  She was found by EMS initially hypotensive, was placed on Levophed and route after given about 500 cc of fluid.  They were hesitant to give further as she does have any underlying heart failure history.  Over here seems blood pressure improved, she continues to feel like she is having multiple runs of diarrhea.  I reviewed her charts, she did have testing for C. difficile recently which was negative, and apparently is continuing to be worked up for her now chronic diarrhea without any clear solutions or answers.     Physical Exam   Triage Vital Signs: ED Triage Vitals  Encounter Vitals Group     BP      Girls Systolic BP Percentile      Girls Diastolic BP Percentile      Boys Systolic BP Percentile      Boys Diastolic BP Percentile      Pulse      Resp      Temp      Temp src      SpO2      Weight      Height      Head Circumference      Peak Flow      Pain Score      Pain Loc      Pain Education      Exclude from Growth Chart     Most recent vital signs: Vitals:   04/21/24 0018 04/21/24 0019  BP: (!) 141/58 130/60  Pulse: 76 79  Resp: 15   Temp: (!) 97.3 F (36.3 C)   SpO2: 95%       General: Awake, no distress.  CV:  Good peripheral perfusion.  Resp:  Normal effort.  Abd:  No distention.  Soft nontender, multiple bowel movements that are melanotic appearing while in the room.  Positive Hemoccult for stool Other:     ED Results / Procedures / Treatments   Labs (all labs ordered are listed, but only abnormal results are displayed) Labs Reviewed  CBC - Abnormal; Notable for the following components:      Result Value   RBC 3.71 (*)    Hemoglobin 11.9 (*)    All other components within normal limits  COMPREHENSIVE METABOLIC PANEL WITH GFR - Abnormal; Notable for the following components:   Potassium 2.5 (*)    Glucose, Bld 103 (*)    BUN 27 (*)    Creatinine, Ser 2.02 (*)    GFR, Estimated 24 (*)    All other components within normal limits  STOOL CULTURE  LIPASE, BLOOD  MAGNESIUM  HEMOGLOBIN  HEMOGLOBIN  BASIC METABOLIC PANEL WITH GFR  BASIC METABOLIC PANEL WITH GFR  HEMOGLOBIN  HEMOGLOBIN  BASIC METABOLIC PANEL WITH GFR  BASIC METABOLIC PANEL WITH GFR  LACTIC ACID, PLASMA  TYPE AND SCREEN     EKG  I believe this is a sinus rhythm with P waves most notable in the longer V5 lead strip, and also in lead III, rate is about 75, axis of 60, intervals appear to be within normal limits, no obvious ischemia   RADIOLOGY   PROCEDURES:  Critical Care performed: No  Procedures   MEDICATIONS ORDERED IN ED: Medications  potassium chloride  10 mEq in 100 mL IVPB (0 mEq Intravenous Stopped 04/20/24 2345)  pravastatin  (PRAVACHOL ) tablet 80 mg (has no administration in time range)  levothyroxine  (SYNTHROID ) tablet 50 mcg (has no administration in time range)  sodium chloride  flush (NS) 0.9 % injection 3 mL (has no administration in time range)  0.9 % NaCl with KCl 40 mEq / L  infusion (has no administration in time range)  acetaminophen  (TYLENOL ) tablet 650 mg (has no administration in time range)    Or  acetaminophen  (TYLENOL ) suppository 650  mg (has no administration in time range)  HYDROcodone -acetaminophen  (NORCO/VICODIN) 5-325 MG per tablet 1-2 tablet (has no administration in time range)  morphine  (PF) 2 MG/ML injection 2 mg (has no administration in time range)  ondansetron  (ZOFRAN ) tablet 4 mg (has no administration in time range)    Or  ondansetron  (ZOFRAN ) injection 4 mg (has no administration in time range)     IMPRESSION / MDM / ASSESSMENT AND PLAN / ED COURSE  I reviewed the triage vital signs and the nursing notes.                               Patient's presentation is most consistent with acute presentation with potential threat to life or bodily function.  83 year old female who presents today with concern of diarrhea syncopal episodes and dark stool.  Here she has melanotic stool which is Hemoccult positive fortunately hemoglobin is reassuring.  She was initially hypotensive but now doing well without any further intervention while here.  She continues to have multiple bowel movements while here and difficulty with changing position without causing recurrence of symptoms.  She has a history of significant heart failure which would make me hesitant to continue with aggressive fluid resuscitation.  Will reach out to medicine team for anticipated admission.   Clinical Course as of 04/21/24 0020  Wed Apr 20, 2024  2043 EMS Levophed was stopped, while conversing with patient, blood pressure then improved to 160s systolically.  Will continue to monitor while here and awaiting labs. [SK]  2200 Spoke with Dr. Cleatus who is agreed to evaluate the patient determine course for further medical management at this time. [SK]    Clinical Course User Index [SK] Fernand Rossie HERO, MD     FINAL CLINICAL IMPRESSION(S) / ED DIAGNOSES   Final diagnoses:  Diarrhea, unspecified type  Melena  Hypokalemia     Rx / DC Orders   ED Discharge Orders     None        Note:  This document was prepared using Dragon voice  recognition software and may include unintentional dictation errors.   Fernand Rossie HERO, MD 04/21/24 [phone]

## 2024-04-20 NOTE — Assessment & Plan Note (Signed)
 Hypothyroidism No acute issues suspected

## 2024-04-20 NOTE — Assessment & Plan Note (Signed)
 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

## 2024-04-20 NOTE — Assessment & Plan Note (Signed)
No acute issues suspected at this time

## 2024-04-20 NOTE — Assessment & Plan Note (Addendum)
 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

## 2024-04-20 NOTE — Assessment & Plan Note (Addendum)
 Secondary to GI losses resulting in hypoperfusion with ATN Metabolic acidosis. Today's creatinine 1.33 with a GFR of 40.  On presentation creatinine 2.02

## 2024-04-20 NOTE — Assessment & Plan Note (Signed)
CPAP nightly if desired °

## 2024-04-20 NOTE — H&P (Signed)
 History and Physical    Patient: Wendy [name] FMW:[id number] DOB: 23-Feb-1941 DOA: 04/20/2024 DOS: the patient was seen and examined on 04/20/2024 PCP: [facility]  Patient coming from: Home  Chief Complaint:  Chief Complaint  Patient presents with   Chest Pain   Near Syncope   Diarrhea    HPI: Wendy [name] is a 83 y.o. female with medical history significant for Chronic HFpEF, HTN, CAD, cardiogenic syncope s/p ICD/pacemaker (10/2022), CKD stage IIIa, gout, hypothyroidism, OSA, chronic diarrhea with negative outpatient workup, being admitted with recurrent syncope in the setting of 3-week history of worsening diarrhea with concern for melena.  She was hypotensive to the 60s with EMS and was placed on Levophed which was discontinued soon after arrival in the ED. On arrival in the ED, BP 83/57 with otherwise normal vitals Labs notable for normal WBC, hemoglobin 11.9 (baseline 12).  CMP notable for potassium of 2.5 with creatinine of 2.02 (baseline 1.5) Lipase and LFTs WNL and magnesium normal Stool culture ordered from the ED Patient treated with IV potassium Admission requested.    Past Medical History:  Diagnosis Date   Anemia    Angina pectoris    Arthritis    Asthma    Blood transfusion    Cancer (HCC)    right breast   CHF (congestive heart failure) (HCC)    Chronic cystitis    COPD (chronic obstructive pulmonary disease) (HCC)    GERD (gastroesophageal reflux disease)    Hyperlipidemia    Hypertension    Mitral valve disease    Sleep apnea    Thyroid  disease    hypo   Past Surgical History:  Procedure Laterality Date   ABDOMINAL HYSTERECTOMY     APPENDECTOMY     BACK SURGERY     BLADDER SURGERY     COLON SURGERY     FOOT SURGERY     HERNIA REPAIR     KIDNEY STONE SURGERY     KNEE SURGERY     right knee   MASTECTOMY     right   OOPHORECTOMY     right   PARATHYROIDECTOMY     Social History:  reports that she has never smoked.  She has never used smokeless tobacco. She reports that she does not drink alcohol and does not use drugs.  Allergies  Allergen Reactions   Ciprofloxacin Other (See Comments)    Other reaction(s): Other (See Comments) Leg cramps Leg cramps    Codeine     REACTION: dizziness and nausea   Cyclobenzaprine     Other reaction(s): Dizziness   Imdur [Isosorbide Dinitrate]    Latex Itching   Lisinopril    Macrobid [Nitrofurantoin Macrocrystal]    Nitrofurantoin    Oxycodone Hcl     REACTION: nausea and vomiting   Prednisone     Other reaction(s): Unknown At high doses    Sulfamethoxazole-Trimethoprim     Other reaction(s): Other (See Comments) Leg cramps    Amoxicillin-Pot Clavulanate Rash    cramps    Tramadol Nausea Only    Other reaction(s): Headache, Other (See Comments)    Family History  Problem Relation Age of Onset   Cancer Mother        unaware of what kind    Prior to Admission medications   Medication Sig Start Date End Date Taking? Authorizing Provider  allopurinol (ZYLOPRIM) 100 MG tablet Take 100 mg by mouth daily. 05/13/23  Yes [provider]  aspirin  EC 81 MG tablet Take 81 mg by mouth daily. 03/04/18  Yes [provider]  cephALEXin (KEFLEX) 250 MG capsule Take 250 mg by mouth daily.   Yes [provider]  Cholecalciferol 50 MCG (2000 UT) TABS Take 1 tablet by mouth daily.   Yes [provider]  co-enzyme Q-10 30 MG capsule Take 30 mg by mouth daily.   Yes [provider]  cyanocobalamin  (VITAMIN B12) 1000 MCG/ML injection Inject 1,000 mcg into the muscle every 30 (thirty) days. 01/17/18 10/10/29 Yes [provider]  dicyclomine (BENTYL) 10 MG capsule Take 10 mg by mouth at bedtime as needed. 03/25/24  Yes [provider]  ezetimibe (ZETIA) 10 MG tablet Take 10 mg by mouth daily. 06/25/20  Yes [provider]  ferrous sulfate  325 (65 FE) MG EC tablet Take 325 mg by mouth daily with  breakfast.   Yes [provider]  furosemide (LASIX) 40 MG tablet Take 40 mg by mouth daily.     Yes [provider]  gabapentin (NEURONTIN) 300 MG capsule Take 300 mg by mouth 3 (three) times daily.   Yes [provider]  levothyroxine  (SYNTHROID , LEVOTHROID) 50 MCG tablet Take 50 mcg by mouth daily.     Yes [provider]  nitroGLYCERIN (NITROSTAT) 0.4 MG SL tablet Place 0.4 mg under the tongue every 5 (five) minutes as needed for chest pain.   Yes [provider]  nystatin (MYCOSTATIN/NYSTOP) powder Apply 1 Application topically 2 (two) times daily.   Yes [provider]  omeprazole (PRILOSEC) 20 MG capsule Take 20 mg by mouth 2 (two) times daily before a meal.   Yes [provider]  pravastatin  (PRAVACHOL ) 80 MG tablet Take 80 mg by mouth daily.   Yes [provider]  telmisartan (MICARDIS) 40 MG tablet Take 40 mg by mouth daily.   Yes [provider]  torsemide (DEMADEX) 20 MG tablet Take 40 mg by mouth daily.   Yes [provider]  traMADol (ULTRAM) 50 MG tablet Take 25 mg by mouth every 12 (twelve) hours as needed.   Yes [provider]  trimethoprim (TRIMPEX) 100 MG tablet Take 100 mg by mouth daily.   Yes [provider]  budesonide-formoterol (SYMBICORT) 80-4.5 MCG/ACT inhaler Take 2 puffs up to 4 times a day as needed for worsening asthma symptoms. If requiring regularly for > 1 week, call your doctor. Patient not taking: Reported on 04/20/2024 04/21/18   [provider]  doxycycline  (VIBRAMYCIN ) 100 MG capsule Take 100 mg by mouth daily. Patient not taking: Reported on 10/23/2022    [provider]  hydrochlorothiazide (HYDRODIURIL) 25 MG tablet Take by mouth. 11/11/18 02/18/20  [provider]  hyoscyamine (LEVBID) 0.375 MG 12 hr tablet Take 0.375 mg by mouth 2 (two) times daily.   Patient not taking: Reported on 04/20/2024    [provider]   losartan (COZAAR) 50 MG tablet Take 50 mg by mouth daily. Patient not taking: Reported on 04/20/2024    [provider]  MAGNESIUM PO Take 2 tablets by mouth in the morning.    [provider]  metoprolol  succinate (TOPROL -XL) 25 MG 24 hr tablet Take by mouth. Patient not taking: Reported on 04/20/2024    [provider]  NON FORMULARY oxygen     [provider]  albuterol  (PROVENTIL ) (2.5 MG/3ML) 0.083% nebulizer solution Take 2.5 mg by nebulization as needed.    03/01/19  [provider]  amLODipine (NORVASC) 5  MG tablet Take 5 mg by mouth daily.  03/01/19  [provider]  fluticasone OREN) 50 MCG/ACT nasal spray  07/08/12 03/01/19  [provider]  fluticasone (FLOVENT HFA) 110 MCG/ACT inhaler Inhale into the lungs 2 (two) times daily.  03/01/19  [provider]    Physical Exam: Vitals:   04/20/24 2150 04/20/24 2220 04/20/24 2230 04/20/24 2300  BP: 99/79 133/62 (!) 138/52 135/79  Pulse: 96 89 80 92  Resp: (!) 21 (!) 22 (!) 24 19  Temp:      TempSrc:      SpO2: 96% 95% 96% 96%  Weight:       Physical Exam  Labs on Admission: I have personally reviewed following labs and imaging studies  CBC: Recent Labs  Lab 04/20/24 2017  WBC 7.8  HGB 11.9*  HCT 36.1  MCV 97.3  PLT 291   Basic Metabolic Panel: Recent Labs  Lab 04/20/24 2017  NA 141  K 2.5*  CL 98  CO2 28  GLUCOSE 103*  BUN 27*  CREATININE 2.02*  CALCIUM 9.4  MG 2.3   GFR: CrCl cannot be calculated (Unknown ideal weight.). Liver Function Tests: Recent Labs  Lab 04/20/24 2017  AST 26  ALT 13  ALKPHOS 90  BILITOT 0.4  PROT 7.2  ALBUMIN 4.2   Recent Labs  Lab 04/20/24 2017  LIPASE 42   No results for input(s): AMMONIA in the last 168 hours. Coagulation Profile: No results for input(s): INR, PROTIME in the last 168 hours. Cardiac Enzymes: No results for input(s): CKTOTAL, CKMB, CKMBINDEX, TROPONINI in the last  168 hours. BNP (last 3 results) No results for input(s): PROBNP in the last 8760 hours. HbA1C: No results for input(s): HGBA1C in the last 72 hours. CBG: No results for input(s): GLUCAP in the last 168 hours. Lipid Profile: No results for input(s): CHOL, HDL, LDLCALC, TRIG, CHOLHDL, LDLDIRECT in the last 72 hours. Thyroid  Function Tests: No results for input(s): TSH, T4TOTAL, FREET4, T3FREE, THYROIDAB in the last 72 hours. Anemia Panel: No results for input(s): VITAMINB12, FOLATE, FERRITIN, TIBC, IRON, RETICCTPCT in the last 72 hours. Urine analysis:    Component Value Date/Time   COLORURINE Straw 04/29/2013 0020   COLORURINE YELLOW 03/11/2010 1335   APPEARANCEUR Clear 04/29/2013 0020   LABSPEC 1.014 04/29/2013 0020   PHURINE 6.0 04/29/2013 0020   PHURINE 6.0 03/11/2010 1335   GLUCOSEU Negative 04/29/2013 0020   GLUCOSEU NEG mg/dL 89/89/7988 8664   HGBUR Negative 04/29/2013 0020   HGBUR NEGATIVE 10/19/2007 1548   BILIRUBINUR Negative 04/29/2013 0020   KETONESUR Negative 04/29/2013 0020   KETONESUR NEG mg/dL 89/89/7988 8664   PROTEINUR Negative 04/29/2013 0020   PROTEINUR NEG mg/dL 89/89/7988 8664   UROBILINOGEN 0.2 03/11/2010 1335   NITRITE Negative 04/29/2013 0020   NITRITE NEG 03/11/2010 1335   LEUKOCYTESUR 1+ 04/29/2013 0020    Radiological Exams on Admission: No results found. Data Reviewed for HPI: Relevant notes from primary care and specialist visits, past discharge summaries as available in EHR, including Care Everywhere. Prior diagnostic testing as pertinent to current admission diagnoses Updated medications and problem lists for reconciliation ED course, including vitals, labs, imaging, treatment and response to treatment Triage notes, nursing and pharmacy notes and ED provider's notes Notable results as noted above in HPI      Assessment and Plan: * Melena Acute on chronic diarrhea Patient with a longstanding  history of diarrhea, now worse, with black stool guaiac positive in the ED IV hydration  Clear liquids and n.p.o. 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 Patient with 3 syncopal episodes in the setting of diarrhea/dehydration Suspect dehydration versus cardiogenic Low suspicion for sepsis given normal WBC, isolated hypotension with otherwise normal vitals-Will get lactic acid Neurologic checks with fall precautions Continuous cardiac monitoring Orthostatic vital signs Echocardiogram IV hydration with monitoring for fluid overload  Chronic diastolic heart failure (HCC) Clinically euvolemic to dry Holding blood pressure lowering GDMT due to hypotension Monitor for fluid overload in view of IV fluids Daily weights  CAD (coronary artery disease) History of minimal triple-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 2014 No acute issues suspected  S/P parathyroidectomy Hypothyroidism No acute issues suspected  Obstructive sleep apnea CPAP nightly if desired  Acute renal failure superimposed on stage 3b chronic kidney disease (HCC) Secondary to GI losses resulting in hypoperfusion with ATN IV fluid resuscitation Monitor renal function and avoid nephrotoxins  Hypokalemia IV potassium repletion Continue to monitor and correct as needed  hx: breast cancer, right, IDC, 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 Observation status is judged to be reasonable and necessary in order to provide the required intensity of service to ensure the patient's safety. The patient's presenting symptoms, physical exam findings, and initial radiographic and laboratory data in the context of their medical condition is felt to place them at decreased risk for further clinical deterioration. Furthermore, it is anticipated that the patient will be medically stable for discharge from the hospital within 2 midnights of admission.   Author: Delayne LULLA Solian, MD 04/20/2024 11:51 PM  For on call review [email or web address].

## 2024-04-21 ENCOUNTER — Observation Stay: Admit: 2024-04-21

## 2024-04-21 ENCOUNTER — Encounter
Admission: EM | Disposition: A | Discharge status: Skilled Nursing Facility | Payer: Self-pay | Source: Home / Self Care | Attending: Family Medicine

## 2024-04-21 ENCOUNTER — Observation Stay: Admitting: Anesthesiology

## 2024-04-21 ENCOUNTER — Encounter: Payer: Self-pay | Admitting: Internal Medicine

## 2024-04-21 DIAGNOSIS — R197 Diarrhea, unspecified: Secondary | ICD-10-CM

## 2024-04-21 DIAGNOSIS — I5032 Chronic diastolic (congestive) heart failure: Secondary | ICD-10-CM

## 2024-04-21 DIAGNOSIS — N179 Acute kidney failure, unspecified: Secondary | ICD-10-CM

## 2024-04-21 DIAGNOSIS — E039 Hypothyroidism, unspecified: Secondary | ICD-10-CM

## 2024-04-21 DIAGNOSIS — I9589 Other hypotension: Secondary | ICD-10-CM | POA: Diagnosis not present

## 2024-04-21 DIAGNOSIS — G4733 Obstructive sleep apnea (adult) (pediatric): Secondary | ICD-10-CM

## 2024-04-21 DIAGNOSIS — N1832 Chronic kidney disease, stage 3b: Secondary | ICD-10-CM

## 2024-04-21 DIAGNOSIS — K449 Diaphragmatic hernia without obstruction or gangrene: Secondary | ICD-10-CM

## 2024-04-21 DIAGNOSIS — K921 Melena: Secondary | ICD-10-CM | POA: Diagnosis not present

## 2024-04-21 DIAGNOSIS — I2510 Atherosclerotic heart disease of native coronary artery without angina pectoris: Secondary | ICD-10-CM

## 2024-04-21 DIAGNOSIS — E876 Hypokalemia: Secondary | ICD-10-CM | POA: Diagnosis not present

## 2024-04-21 DIAGNOSIS — Z9889 Other specified postprocedural states: Secondary | ICD-10-CM

## 2024-04-21 DIAGNOSIS — E785 Hyperlipidemia, unspecified: Secondary | ICD-10-CM | POA: Insufficient documentation

## 2024-04-21 DIAGNOSIS — Z853 Personal history of malignant neoplasm of breast: Secondary | ICD-10-CM

## 2024-04-21 DIAGNOSIS — R55 Syncope and collapse: Secondary | ICD-10-CM

## 2024-04-21 DIAGNOSIS — Z9089 Acquired absence of other organs: Secondary | ICD-10-CM

## 2024-04-21 HISTORY — PX: ESOPHAGOGASTRODUODENOSCOPY: SHX5428

## 2024-04-21 LAB — GASTROINTESTINAL PANEL BY PCR, STOOL (REPLACES STOOL CULTURE)

## 2024-04-21 LAB — C DIFFICILE QUICK SCREEN W PCR REFLEX
C Diff antigen: NEGATIVE
C Diff interpretation: NOT DETECTED
C Diff toxin: NEGATIVE

## 2024-04-21 LAB — BASIC METABOLIC PANEL WITH GFR
Anion gap: 14 (ref 5–15)
Anion gap: 13 (ref 5–15)
Anion gap: 17 — ABNORMAL HIGH (ref 5–15)
Anion gap: 16 — ABNORMAL HIGH (ref 5–15)
BUN: 27 mg/dL — ABNORMAL HIGH (ref 8–23)
BUN: 25 mg/dL — ABNORMAL HIGH (ref 8–23)
BUN: 21 mg/dL (ref 8–23)
BUN: 20 mg/dL (ref 8–23)
CO2: 27 mmol/L (ref 22–32)
CO2: 25 mmol/L (ref 22–32)
CO2: 19 mmol/L — ABNORMAL LOW (ref 22–32)
CO2: 22 mmol/L (ref 22–32)
Calcium: 9.1 mg/dL (ref 8.9–10.3)
Calcium: 8.8 mg/dL — ABNORMAL LOW (ref 8.9–10.3)
Calcium: 8.7 mg/dL — ABNORMAL LOW (ref 8.9–10.3)
Calcium: 8.7 mg/dL — ABNORMAL LOW (ref 8.9–10.3)
Chloride: 99 mmol/L (ref 98–111)
Chloride: 105 mmol/L (ref 98–111)
Chloride: 105 mmol/L (ref 98–111)
Chloride: 101 mmol/L (ref 98–111)
Creatinine, Ser: 1.85 mg/dL — ABNORMAL HIGH (ref 0.44–1.00)
Creatinine, Ser: 1.54 mg/dL — ABNORMAL HIGH (ref 0.44–1.00)
Creatinine, Ser: 1.47 mg/dL — ABNORMAL HIGH (ref 0.44–1.00)
Creatinine, Ser: 1.42 mg/dL — ABNORMAL HIGH (ref 0.44–1.00)
GFR, Estimated: 27 mL/min — ABNORMAL LOW (ref >60)
GFR, Estimated: 34 mL/min — ABNORMAL LOW (ref >60)
GFR, Estimated: 35 mL/min — ABNORMAL LOW (ref >60)
GFR, Estimated: 37 mL/min — ABNORMAL LOW (ref >60)
Glucose, Bld: 118 mg/dL — ABNORMAL HIGH (ref 70–99)
Glucose, Bld: 113 mg/dL — ABNORMAL HIGH (ref 70–99)
Glucose, Bld: 145 mg/dL — ABNORMAL HIGH (ref 70–99)
Glucose, Bld: 168 mg/dL — ABNORMAL HIGH (ref 70–99)
Potassium: 3.3 mmol/L — ABNORMAL LOW (ref 3.5–5.1)
Potassium: 3.3 mmol/L — ABNORMAL LOW (ref 3.5–5.1)
Potassium: 3.8 mmol/L (ref 3.5–5.1)
Potassium: 3 mmol/L — ABNORMAL LOW (ref 3.5–5.1)
Sodium: 141 mmol/L (ref 135–145)
Sodium: 142 mmol/L (ref 135–145)
Sodium: 141 mmol/L (ref 135–145)
Sodium: 139 mmol/L (ref 135–145)

## 2024-04-21 LAB — HEMOGLOBIN
Hemoglobin: 11.2 g/dL — ABNORMAL LOW (ref 12.0–15.0)
Hemoglobin: 11.8 g/dL — ABNORMAL LOW (ref 12.0–15.0)
Hemoglobin: 12.1 g/dL (ref 12.0–15.0)
Hemoglobin: 10.6 g/dL — ABNORMAL LOW (ref 12.0–15.0)

## 2024-04-21 LAB — LACTIC ACID, PLASMA: Lactic Acid, Venous: 1.9 mmol/L (ref 0.5–1.9)

## 2024-04-21 LAB — GLUCOSE, CAPILLARY: Glucose-Capillary: 96 mg/dL (ref 70–99)

## 2024-04-21 SURGERY — EGD (ESOPHAGOGASTRODUODENOSCOPY)
Anesthesia: General

## 2024-04-21 MED ORDER — POTASSIUM CHLORIDE CRYS ER 20 MEQ PO TBCR
20.0000 meq | EXTENDED_RELEASE_TABLET | Freq: Two times a day (BID) | ORAL | Status: DC
  Administered 2024-04-21: 20 meq via ORAL
  Filled 2024-04-21: qty 1

## 2024-04-21 MED ORDER — LIDOCAINE HCL (CARDIAC) PF 100 MG/5ML IV SOSY
PREFILLED_SYRINGE | INTRAVENOUS | Status: DC | PRN
  Administered 2024-04-21: 60 mg via INTRAVENOUS

## 2024-04-21 MED ORDER — SODIUM CHLORIDE 0.9 % IV SOLN: INTRAVENOUS | Status: DC

## 2024-04-21 MED ORDER — GLYCOPYRROLATE 0.2 MG/ML IJ SOLN
INTRAMUSCULAR | Status: DC | PRN
  Administered 2024-04-21: .2 mg via INTRAVENOUS

## 2024-04-21 MED ORDER — GLYCOPYRROLATE 0.2 MG/ML IJ SOLN
INTRAMUSCULAR | Status: AC
  Filled 2024-04-21: qty 1

## 2024-04-21 MED ORDER — PROPOFOL 10 MG/ML IV BOLUS
INTRAVENOUS | Status: DC | PRN
Start: 2024-04-21 — End: 2024-04-21
  Administered 2024-04-21: 50 mg via INTRAVENOUS

## 2024-04-21 MED ORDER — PEG 3350-KCL-NA BICARB-NACL 420 G PO SOLR
4000.0000 mL | Freq: Once | ORAL | Status: AC
Start: 2024-04-21 — End: 2024-04-21
  Administered 2024-04-21: 4000 mL via ORAL
  Filled 2024-04-21: qty 4000

## 2024-04-21 MED ORDER — PROPOFOL 500 MG/50ML IV EMUL
INTRAVENOUS | Status: DC | PRN
Start: 2024-04-21 — End: 2024-04-21
  Administered 2024-04-21: 50 ug/kg/min via INTRAVENOUS

## 2024-04-21 NOTE — Consult Note (Signed)
 Rogelia Copping, MD [facility]  [address], [address] [place], [place] [address] Phone: [phone] Fax : [phone]  Consultation  Referring Provider:     Melena Primary Care Physician:  [facility] Primary Gastroenterologist: [facility]         Reason for Consultation:     Melena and diarrhea  Date of Admission:  04/20/2024 Date of Consultation:  04/21/2024         HPI:   Wendy [name] is a 83 y.o. female who has a long history of diarrhea and has been seen in the past by Dr. Gaylyn and then followed up with Dr. Kalman at [facility] and then was seen at [facility] GI who now presents with a report of diarrhea with near syncope and chest pain.  The patient reports that she has had diarrhea for many years but it has gotten much worse over the last 3 months.  She is not able to give much history but states that she has been seen for this in the past and no cause has been found.  She also endorses that she has had a history of GI bleeds in the past with cauterization of something in her stomach. Initially found by EMS to be hypotensive and was placed on Levophed and route and given 500 cc of fluid.  She also had a recent C. difficile stool test that was negative.  The patient was reported to have had black stools with the diarrhea.  The patient's most recent hemoglobins have shown:  Component     Latest Ref Rng 10/23/2022 04/20/2024 04/21/2024  Hemoglobin     12.0 - 15.0 g/dL 87.9  88.0 (L)  88.1 (L)   Hemoglobin        11.2 (L)    The patient liver enzymes were normal on admission and her potassium was 2.5 which this morning had come back at 3.3.  Past Medical History:  Diagnosis Date   Anemia    Angina pectoris    Arthritis    Asthma    Blood transfusion    Cancer (HCC)    right breast   CHF (congestive heart failure) (HCC)    Chronic cystitis    COPD (chronic obstructive pulmonary disease) (HCC)    GERD (gastroesophageal reflux disease)    Hyperlipidemia     Hypertension    Mitral valve disease    Sleep apnea    Thyroid  disease    hypo    Past Surgical History:  Procedure Laterality Date   ABDOMINAL HYSTERECTOMY     APPENDECTOMY     BACK SURGERY     BLADDER SURGERY     COLON SURGERY     FOOT SURGERY     HERNIA REPAIR     KIDNEY STONE SURGERY     KNEE SURGERY     right knee   MASTECTOMY     right   OOPHORECTOMY     right   PARATHYROIDECTOMY      Prior to Admission medications   Medication Sig Start Date End Date Taking? Authorizing Provider  allopurinol  (ZYLOPRIM ) 100 MG tablet Take 100 mg by mouth daily. 05/13/23  Yes [provider]  aspirin EC 81 MG tablet Take 81 mg by mouth daily. 03/04/18  Yes [provider]  cephALEXin (KEFLEX) 250 MG capsule Take 250 mg by mouth daily.   Yes [provider]  Cholecalciferol 50 MCG (2000 UT) TABS Take 1 tablet by mouth  daily.   Yes [provider]  co-enzyme Q-10 30 MG capsule Take 30 mg by mouth daily.   Yes [provider]  cyanocobalamin  (VITAMIN B12) 1000 MCG/ML injection Inject 1,000 mcg into the muscle every 30 (thirty) days. 01/17/18 10/10/29 Yes [provider]  dicyclomine (BENTYL) 10 MG capsule Take 10 mg by mouth at bedtime as needed. 03/25/24  Yes [provider]  ezetimibe (ZETIA) 10 MG tablet Take 10 mg by mouth daily. 06/25/20  Yes [provider]  ferrous sulfate  325 (65 FE) MG EC tablet Take 325 mg by mouth daily with breakfast.   Yes [provider]  furosemide (LASIX) 40 MG tablet Take 40 mg by mouth daily.     Yes [provider]  gabapentin (NEURONTIN) 300 MG capsule Take 300 mg by mouth 3 (three) times daily.   Yes [provider]  levothyroxine  (SYNTHROID , LEVOTHROID) 50 MCG tablet Take 50 mcg by mouth daily.     Yes [provider]  nitroGLYCERIN (NITROSTAT) 0.4 MG SL tablet Place 0.4 mg under the tongue every 5 (five) minutes as needed for chest pain.   Yes  [provider]  nystatin (MYCOSTATIN/NYSTOP) powder Apply 1 Application topically 2 (two) times daily.   Yes [provider]  omeprazole (PRILOSEC) 20 MG capsule Take 20 mg by mouth 2 (two) times daily before a meal.   Yes [provider]  pravastatin  (PRAVACHOL ) 80 MG tablet Take 80 mg by mouth daily.   Yes [provider]  telmisartan (MICARDIS) 40 MG tablet Take 40 mg by mouth daily.   Yes [provider]  torsemide (DEMADEX) 20 MG tablet Take 40 mg by mouth daily.   Yes [provider]  traMADol (ULTRAM) 50 MG tablet Take 25 mg by mouth every 12 (twelve) hours as needed.   Yes [provider]  trimethoprim (TRIMPEX) 100 MG tablet Take 100 mg by mouth daily.   Yes [provider]  budesonide-formoterol (SYMBICORT) 80-4.5 MCG/ACT inhaler Take 2 puffs up to 4 times a day as needed for worsening asthma symptoms. If requiring regularly for > 1 week, call your doctor. Patient not taking: Reported on 04/20/2024 04/21/18   [provider]  doxycycline  (VIBRAMYCIN ) 100 MG capsule Take 100 mg by mouth daily. Patient not taking: Reported on 10/23/2022    [provider]  hydrochlorothiazide (HYDRODIURIL) 25 MG tablet Take by mouth. 11/11/18 02/18/20  [provider]  hyoscyamine (LEVBID) 0.375 MG 12 hr tablet Take 0.375 mg by mouth 2 (two) times daily.   Patient not taking: Reported on 04/20/2024    [provider]  losartan (COZAAR) 50 MG tablet Take 50 mg by mouth daily. Patient not taking: Reported on 04/20/2024    [provider]  MAGNESIUM PO Take 2 tablets by mouth in the morning.    [provider]  metoprolol  succinate (TOPROL -XL) 25 MG 24 hr tablet Take by mouth. Patient not taking: Reported on 04/20/2024    [provider]  NON FORMULARY oxygen     [provider]  albuterol  (PROVENTIL ) (2.5 MG/3ML) 0.083% nebulizer solution Take 2.5 mg by nebulization  as needed.    03/01/19  [provider]  amLODipine (NORVASC) 5 MG tablet Take 5 mg by mouth daily.  03/01/19  [provider]  fluticasone OREN) 50 MCG/ACT nasal spray  07/08/12 03/01/19  [provider]  fluticasone (FLOVENT HFA) 110 MCG/ACT inhaler Inhale into the lungs 2 (two) times daily.  03/01/19  [provider]    Family History  Problem Relation Age of Onset   Cancer Mother        unaware of what kind     Social History   Tobacco Use   Smoking status: Never   Smokeless tobacco: Never  Vaping Use   Vaping status: Never Used  Substance Use Topics   Alcohol use: No   Drug use: No    Allergies as of 04/20/2024 - Review Complete 04/20/2024  Allergen Reaction Noted   Ciprofloxacin Other (See Comments) 11/16/2015   Codeine  08/20/2009   Cyclobenzaprine  10/19/2018   Imdur [isosorbide dinitrate]  07/30/2016   Latex Itching 09/24/2011   Lisinopril  07/30/2016   Macrobid [nitrofurantoin macrocrystal]  07/30/2016   Nitrofurantoin  06/18/2009   Oxycodone hcl  08/20/2009   Prednisone  08/24/2013   Sulfamethoxazole-trimethoprim  08/10/2017   Amoxicillin-pot clavulanate Rash 08/02/2015   Tramadol Nausea Only 12/22/2014    Review of Systems:    All systems reviewed and negative except where noted in HPI.   Physical Exam:  Vital signs in last 24 hours: Temp:  [97.3 F (36.3 C)-100.7 F (38.2 C)] 100.7 F (38.2 C) (11/20 0833) Pulse Rate:  [75-97] 97 (11/20 0833) Resp:  [15-24] 16 (11/20 0833) BP: (83-186)/(52-86) 186/69 (11/20 0833) SpO2:  [94 %-99 %] 94 % (11/20 0833) Weight:  [103.1 kg-105.8 kg] 105.8 kg (11/20 0450) Last BM Date : 04/21/24 General:   Pleasant, cooperative in NAD Head:  Normocephalic and atraumatic. Eyes:   No icterus.   Conjunctiva pink. PERRLA. Ears:  Normal auditory acuity. Neck:  Supple; no masses or thyroidomegaly Lungs: Respirations even and unlabored. Lungs clear to auscultation bilaterally.   No  wheezes, crackles, or rhonchi.  Heart:  Regular rate and rhythm;  Without murmur, clicks, rubs or gallops Abdomen:  Soft, nondistended, nontender. Normal bowel sounds. No appreciable masses or hepatomegaly.  No rebound or guarding.  Rectal:  Not performed. Msk:  Symmetrical without gross deformities.   Extremities:  Without edema, cyanosis or clubbing. Neurologic:  Alert and oriented x3;  grossly normal neurologically. Skin:  Intact without significant lesions or rashes. Cervical Nodes:  No significant cervical adenopathy. Psych:  Alert and cooperative. Normal affect.  LAB RESULTS: Recent Labs    04/20/24 2017 04/21/24 0226 04/21/24 0821  WBC 7.8  --   --   HGB 11.9* 11.2* 11.8*  HCT 36.1  --   --   PLT 291  --   --    BMET Recent Labs    04/20/24 2017 04/21/24 0226 04/21/24 0821  NA 141 141 142  K 2.5* 3.3* 3.3*  CL 98 99 105  CO2 28 27 25   GLUCOSE 103* 118* 113*  BUN 27* 27* 25*  CREATININE 2.02* 1.85* 1.54*  CALCIUM 9.4 9.1 8.8*   LFT Recent Labs    04/20/24 2017  PROT 7.2  ALBUMIN 4.2  AST 26  ALT 13  ALKPHOS 90  BILITOT 0.4   PT/INR No results for input(s): LABPROT, INR in the last 72 hours.  STUDIES: No results found.    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 y/o female with questionable melena with diarrhea without a recent follow-up with GI despite having diarrhea for 3 months.  The patient came in with hypotension and hypokalemia.  The patient will be set up for an EGD for today.  This will be done because  the patient has a report of melena and she will need a workup for her diarrhea which would likely include a colonoscopy if EGD is negative.  Plan:  The patient will be set up for an EGD for today.  The patient will likely need to be set up for a colonoscopy for tomorrow due to her chronic diarrhea.  Differential diagnosis includes microscopic colitis versus irritable bowel syndrome and less likely inflammatory bowel disease.  The patient has been explained the plan and agrees with it.  Thank you for involving me in the care of this patient.      LOS: 0 days   Rogelia Copping, MD, MD. NOLIA 04/21/2024, 10:57 AM,  Pager [phone] 7am-5pm  Check AMION for 5pm -7am coverage and on weekends   Note: This dictation was prepared with Dragon dictation along with smaller phrase technology. Any transcriptional errors that result from this process are unintentional.

## 2024-04-21 NOTE — Assessment & Plan Note (Signed)
-  On levothyroxine

## 2024-04-21 NOTE — Progress Notes (Signed)
 Progress Note   Patient: Wendy [name] FMW:[id number] DOB: 1940/06/09 DOA: 04/20/2024     0 DOS: the patient was seen and examined on 04/21/2024   Brief hospital course: 83 y.o. 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  She denies abdominal pain or vomiting she was hypotensive to the 60s with EMS and was placed on Levophed which was discontinued soon after arrival in the ED. On arrival in the ED, BP 83/57 with otherwise normal vitals Labs notable for normal WBC, hemoglobin 11.9 (baseline 12).  CMP notable for potassium of 2.5 with creatinine of 2.02 (baseline 1.5) Lipase and LFTs WNL and magnesium normal Stool culture ordered from the ED Patient treated with IV potassium Admission requested.   11/20.  Patient having diarrhea for 3 weeks.  Stool studies sent off and negative.  EGD negative.  Prepping for colonoscopy and this evening will have a colonoscopy tomorrow  Assessment and Plan: * Acute on chronic diarrhea Stool studies negative.  Colonoscopy for tomorrow.  Hypokalemia Potassium replacement and IV fluids.  Melena EGD negative, colonoscopy for tomorrow  Hypotension IV fluid hydration.  Recurrent syncope Likely secondary to dehydration, hypotension and hypokalemia  Chronic diastolic heart failure (HCC) Clinically euvolemic to dry Holding blood pressure lowering GDMT due to hypotension   CAD (coronary artery disease) History of minimal triple-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 2014 No acute issues suspected  Hyperlipidemia, unspecified On Pravachol   S/P parathyroidectomy Hypothyroidism No acute issues suspected  Hypothyroidism On  levothyroxine   Obstructive sleep apnea CPAP nightly if desired  Acute renal failure superimposed on stage 3b chronic kidney disease (HCC) Secondary to GI losses resulting in hypoperfusion with ATN IV fluid hydration.  Today's creatinine down to 1.54 with a GFR of 34.  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  Does not feel well.  Has been having diarrhea for the past 3 weeks or so.  Sometimes up to 10 times per day.  Also seeing some black stools.  Physical Exam: Vitals:   04/21/24 1258 04/21/24 1415 04/21/24 1424 04/21/24 1434  BP: (!) 165/60 93/62 (!) 102/52 (!) 105/48  Pulse: 95 82 83 84  Resp: 20 20 18 18   Temp: 98.1 F (36.7 C) 98.5 F (36.9 C)    TempSrc: Temporal Temporal    SpO2: 96% 91% 93% 95%  Weight:       Physical Exam HENT:     Head: Normocephalic.     Mouth/Throat:     Pharynx: No oropharyngeal exudate.  Eyes:     General: Lids are normal.     Conjunctiva/sclera: Conjunctivae normal.  Cardiovascular:     Rate and Rhythm: Normal rate and regular rhythm.     Heart sounds: Normal heart sounds, S1 normal and S2 normal.  Pulmonary:     Breath sounds: No decreased breath sounds, wheezing, rhonchi or rales.  Abdominal:     Palpations: Abdomen is soft.     Tenderness: There is abdominal tenderness in the epigastric area.  Musculoskeletal:     Right lower leg: Swelling present.     Left lower leg: Swelling  present.  Skin:    General: Skin is warm.     Findings: No rash.  Neurological:     Mental Status: She is alert and oriented to person, place, and time.     Data Reviewed: Creatinine 1.54, potassium 3.3, hemoglobin 11.8  Family Communication: Tried to reach POA  Disposition: Status is: Observation EGD negative, planning colonoscopy tomorrow with prep tonight.  Planned Discharge Destination: Home with Home  Health    Time spent: 28 minutes  Author: Charlie Patterson, MD 04/21/2024 4:03 PM  For on call review [email or web address].

## 2024-04-21 NOTE — Assessment & Plan Note (Addendum)
 Stool studies negative.  Colonoscopy negative and biopsies do not show any etiology.  Continue cholestyramine  for diarrhea.  As needed Imodium .  Decrease dose of allopurinol  down to 50 mg daily because this could be a culprit of diarrhea.  Patient also has a history of colon resection and short colon could cause diarrhea also.

## 2024-04-21 NOTE — Plan of Care (Signed)
   Problem: Education: Goal: Knowledge of General Education information will improve Description Including pain rating scale, medication(s)/side effects and non-pharmacologic comfort measures Outcome: Progressing

## 2024-04-21 NOTE — Assessment & Plan Note (Signed)
On Pravachol 

## 2024-04-21 NOTE — Hospital Course (Addendum)
 83 y.o. 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  She denies abdominal pain or vomiting she was hypotensive to the 60s with EMS and was placed on Levophed which was discontinued soon after arrival in the ED. On arrival in the ED, BP 83/57 with otherwise normal vitals Labs notable for normal WBC, hemoglobin 11.9 (baseline 12).  CMP notable for potassium of 2.5 with creatinine of 2.02 (baseline 1.5) Lipase and LFTs WNL and magnesium normal Stool culture ordered from the ED Patient treated with IV potassium Admission requested.   11/20.  Patient having diarrhea for 3 weeks.  Stool studies sent off and negative.  EGD negative.  Prepping for colonoscopy and this evening will have a colonoscopy tomorrow 11/21. Colonoscopy negative but biosies taken 11/22.  Patient diagnosed with pneumonia overnight.  Starting rocephin  and zithromax .  Patient went into rapid afib this am.  Coughing up blood.  Placed on oxygen .  11/22 continued.  Needed to switch over to amiodarone  drip to control heart rate with blood pressure being on the lower side.  Will hold further Lasix  and give a fluid bolus.  Consulted pulmonary for coughing up blood.  With starting amiodarone  will have to change Zithromax  over to doxycycline .  With possible aspiration will change Rocephin  over to Zosyn . 11/23.  Patient complained of chest pain this morning.  Given a fluid bolus for low blood pressure.  Troponins trended down from yesterday.  Patient still having some diarrhea and coughing up blood.  This afternoon down to 5 L nasal cannula. 11/24.  Patient still complaining of coughing up blood.  Still having some rib pains.  Still  having some diarrhea.

## 2024-04-21 NOTE — Transfer of Care (Signed)
 Immediate Anesthesia Transfer of Care Note  Patient: Wendy [name]  Procedure(s) Performed: EGD (ESOPHAGOGASTRODUODENOSCOPY)  Patient Location: PACU  Anesthesia Type:General  Level of Consciousness: sedated  Airway & Oxygen  Therapy: Patient Spontanous Breathing  Post-op Assessment: Report given to RN and Post -op Vital signs reviewed and stable  Post vital signs: Reviewed and stable  Last Vitals:  Vitals Value Taken Time  BP 93/62 04/21/24 14:15  Temp    Pulse 82 04/21/24 14:17  Resp 21 04/21/24 14:17  SpO2 94 % 04/21/24 14:17  Vitals shown include unfiled device data.  Last Pain:  Vitals:   04/21/24 1258  TempSrc: Temporal  PainSc:          Complications: No notable events documented.

## 2024-04-21 NOTE — Op Note (Signed)
 [facility] Gastroenterology Patient Name: Wendy [name] Procedure Date: 04/21/2024 1:56 PM MRN: [id number] Account #: [id number] Date of Birth: 1940-08-11 Admit Type: Outpatient Age: 83 Room: [facility] ENDO ROOM 4 Gender: Female Note Status: Finalized Instrument Name: Upper GI Scope [id number] Procedure:             Upper GI endoscopy Indications:           Melena Providers:             Rogelia Copping MD, MD Referring MD:          No Local Md, MD (Referring MD) Medicines:             Propofol per Anesthesia Complications:         No immediate complications. Procedure:             Pre-Anesthesia Assessment:                        - Prior to the procedure, a History and Physical was                         performed, and patient medications and allergies were                         reviewed. The patient's tolerance of previous                         anesthesia was also reviewed. The risks and benefits                         of the procedure and the sedation options and risks                         were discussed with the patient. All questions were                         answered, and informed consent was obtained. Prior                         Anticoagulants: The patient has taken no anticoagulant                         or antiplatelet agents. ASA Grade Assessment: II - A                         patient with mild systemic disease. After reviewing                         the risks and benefits, the patient was deemed in                         satisfactory condition to undergo the procedure.                        After obtaining informed consent, the endoscope was                         passed under direct vision. Throughout the procedure,  the patient's blood pressure, pulse, and oxygen                          saturations were monitored continuously. The Endoscope                         was introduced through the mouth, and advanced to the                          third part of duodenum. The upper GI endoscopy was                         accomplished without difficulty. The patient tolerated                         the procedure well. Findings:      A large hiatal hernia was present.      The stomach was normal.      The examined duodenum was normal. Impression:            - Large hiatal hernia.                        - Normal stomach.                        - Normal examined duodenum.                        - No specimens collected. Recommendation:        - Return patient to hospital ward for ongoing care.                        - Clear liquid diet.                        - Continue present medications. Procedure Code(s):     --- Professional ---                        [phone], Esophagogastroduodenoscopy, flexible,                         transoral; diagnostic, including collection of                         specimen(s) by brushing or washing, when performed                         (separate procedure) Diagnosis Code(s):     --- Professional ---                        K92.1, Melena (includes Hematochezia) CPT copyright 2022 American Medical Association. All rights reserved. The codes documented in this report are preliminary and upon coder review may  be revised to meet current compliance requirements. Rogelia Copping MD, MD 04/21/2024 2:14:54 PM This report has been signed electronically. Number of Addenda: 0 Note Initiated On: 04/21/2024 1:56 PM Estimated Blood Loss:  Estimated blood loss: none.      [facility]

## 2024-04-21 NOTE — Anesthesia Postprocedure Evaluation (Signed)
 Anesthesia Post Note  Patient: Wendy [name]  Procedure(s) Performed: EGD (ESOPHAGOGASTRODUODENOSCOPY)  Patient location during evaluation: Endoscopy Anesthesia Type: General Level of consciousness: awake and alert Pain management: pain level controlled Vital Signs Assessment: post-procedure vital signs reviewed and stable Respiratory status: spontaneous breathing, nonlabored ventilation and respiratory function stable Cardiovascular status: blood pressure returned to baseline and stable Postop Assessment: no apparent nausea or vomiting Anesthetic complications: no   No notable events documented.   Last Vitals:  Vitals:   04/21/24 1434 04/21/24 1620  BP: (!) 105/48 (!) 141/63  Pulse: 84 86  Resp: 18 16  Temp:  36.7 C  SpO2: 95% 99%    Last Pain:  Vitals:   04/21/24 1620  TempSrc: Oral  PainSc:                  Fairy POUR Emmilia Sowder

## 2024-04-21 NOTE — Anesthesia Preprocedure Evaluation (Signed)
 Anesthesia Evaluation  Patient identified by MRN, date of birth, ID band Patient awake    Reviewed: Allergy & Precautions, NPO status , Patient's Chart, lab work & pertinent test results  History of Anesthesia Complications Negative for: history of anesthetic complications  Airway Mallampati: III  TM Distance: <3 FB Neck ROM: full    Dental  (+) Poor Dentition, Missing, Chipped   Pulmonary asthma , sleep apnea , COPD   Pulmonary exam normal        Cardiovascular hypertension, (-) angina + CAD and +CHF  Normal cardiovascular exam     Neuro/Psych negative neurological ROS  negative psych ROS   GI/Hepatic Neg liver ROS,GERD  Controlled,,  Endo/Other  Hypothyroidism    Renal/GU Renal disease  negative genitourinary   Musculoskeletal   Abdominal   Peds  Hematology negative hematology ROS (+)   Anesthesia Other Findings Patient is NPO appropriate and reports no nausea or vomiting today.   Past Medical History: No date: Anemia No date: Angina pectoris No date: Arthritis No date: Asthma No date: Blood transfusion No date: Cancer [facility])     Comment:  right breast No date: CHF (congestive heart failure) (HCC) No date: Chronic cystitis No date: COPD (chronic obstructive pulmonary disease) (HCC) No date: GERD (gastroesophageal reflux disease) No date: Hyperlipidemia No date: Hypertension No date: Mitral valve disease No date: Sleep apnea No date: Thyroid  disease     Comment:  hypo  Past Surgical History: No date: ABDOMINAL HYSTERECTOMY No date: APPENDECTOMY No date: BACK SURGERY No date: BLADDER SURGERY No date: COLON SURGERY No date: FOOT SURGERY No date: HERNIA REPAIR No date: KIDNEY STONE SURGERY No date: KNEE SURGERY     Comment:  right knee No date: MASTECTOMY     Comment:  right No date: OOPHORECTOMY     Comment:  right No date: PARATHYROIDECTOMY  BMI    Body Mass Index: 37.65 kg/m       Reproductive/Obstetrics negative OB ROS                              Anesthesia Physical Anesthesia Plan  ASA: 3  Anesthesia Plan: General   Post-op Pain Management:    Induction: Intravenous  PONV Risk Score and Plan: Propofol infusion and TIVA  Airway Management Planned: Natural Airway and Nasal Cannula  Additional Equipment:   Intra-op Plan:   Post-operative Plan:   Informed Consent: I have reviewed the patients History and Physical, chart, labs and discussed the procedure including the risks, benefits and alternatives for the proposed anesthesia with the patient or authorized representative who has indicated his/her understanding and acceptance.     Dental Advisory Given  Plan Discussed with: Anesthesiologist, CRNA and Surgeon  Anesthesia Plan Comments: (Patient consented for risks of anesthesia including but not limited to:  - adverse reactions to medications - risk of airway placement if required - damage to eyes, teeth, lips or other oral mucosa - nerve damage due to positioning  - sore throat or hoarseness - Damage to heart, brain, nerves, lungs, other parts of body or loss of life  Patient voiced understanding and assent.)        Anesthesia Quick Evaluation

## 2024-04-21 NOTE — Progress Notes (Signed)
 Mobility Specialist - Progress Note    04/21/24 1116  Mobility  Activity Ambulated with assistance;Stood at bedside;Dangled on edge of bed  Level of Assistance Contact guard assist, steadying assist  Assistive Device Front wheel walker  Distance Ambulated (ft) 15 ft  Range of Motion/Exercises Active  Activity Response Tolerated well  Mobility Referral Yes  Mobility visit 1 Mobility  Mobility Specialist Start Time (ACUTE ONLY) 1104  Mobility Specialist Stop Time (ACUTE ONLY) 1116  Mobility Specialist Time Calculation (min) (ACUTE ONLY) 12 min   Pt resting on toilet upon entry on RA. Pt STS and ambulates back to bed with RW and left with needs in reach. Bed alarm activated.   Guido Rumble Mobility Specialist 04/21/24, 11:31 AM

## 2024-04-22 ENCOUNTER — Observation Stay
Admit: 2024-04-22 | Discharge: 2024-04-22 | Disposition: A | Discharge status: Home / Self Care | Attending: Internal Medicine | Admitting: Internal Medicine

## 2024-04-22 ENCOUNTER — Encounter
Admission: EM | Disposition: A | Discharge status: Skilled Nursing Facility | Payer: Self-pay | Source: Home / Self Care | Attending: Family Medicine

## 2024-04-22 ENCOUNTER — Encounter: Payer: Self-pay | Admitting: Internal Medicine

## 2024-04-22 ENCOUNTER — Observation Stay

## 2024-04-22 ENCOUNTER — Observation Stay: Admitting: Anesthesiology

## 2024-04-22 DIAGNOSIS — K641 Second degree hemorrhoids: Secondary | ICD-10-CM | POA: Diagnosis not present

## 2024-04-22 DIAGNOSIS — J9601 Acute respiratory failure with hypoxia: Secondary | ICD-10-CM

## 2024-04-22 DIAGNOSIS — R55 Syncope and collapse: Secondary | ICD-10-CM | POA: Diagnosis not present

## 2024-04-22 DIAGNOSIS — E8729 Other acidosis: Secondary | ICD-10-CM

## 2024-04-22 DIAGNOSIS — K529 Noninfective gastroenteritis and colitis, unspecified: Secondary | ICD-10-CM

## 2024-04-22 HISTORY — PX: COLONOSCOPY: SHX5424

## 2024-04-22 LAB — CBC
HCT: 36.4 % (ref 36.0–46.0)
Hemoglobin: 12.2 g/dL (ref 12.0–15.0)
MCH: 32.6 pg (ref 26.0–34.0)
MCHC: 33.5 g/dL (ref 30.0–36.0)
MCV: 97.3 fL (ref 80.0–100.0)
Platelets: 209 K/uL (ref 150–400)
RBC: 3.74 MIL/uL — ABNORMAL LOW (ref 3.87–5.11)
RDW: 13.8 % (ref 11.5–15.5)
WBC: 15 K/uL — ABNORMAL HIGH (ref 4.0–10.5)
nRBC: 0 % (ref 0.0–0.2)

## 2024-04-22 LAB — BASIC METABOLIC PANEL WITH GFR
Anion gap: 12 (ref 5–15)
BUN: 18 mg/dL (ref 8–23)
CO2: 26 mmol/L (ref 22–32)
Calcium: 8.8 mg/dL — ABNORMAL LOW (ref 8.9–10.3)
Chloride: 103 mmol/L (ref 98–111)
Creatinine, Ser: 1.32 mg/dL — ABNORMAL HIGH (ref 0.44–1.00)
GFR, Estimated: 40 mL/min — ABNORMAL LOW (ref >60)
Glucose, Bld: 149 mg/dL — ABNORMAL HIGH (ref 70–99)
Potassium: 2.9 mmol/L — ABNORMAL LOW (ref 3.5–5.1)
Sodium: 142 mmol/L (ref 135–145)

## 2024-04-22 LAB — ECHOCARDIOGRAM COMPLETE
AR max vel: 3.27 cm2
AV Area VTI: 3.42 cm2
AV Area mean vel: 3.14 cm2
AV Mean grad: 6 mmHg
AV Peak grad: 11.3 mmHg
Ao pk vel: 1.68 m/s
Area-P 1/2: 3.68 cm2
MV VTI: 2.71 cm2
S' Lateral: 2.5 cm
Weight: 3671.98 [oz_av]

## 2024-04-22 LAB — POTASSIUM: Potassium: 3.5 mmol/L (ref 3.5–5.1)

## 2024-04-22 LAB — GLUCOSE, CAPILLARY
Glucose-Capillary: 144 mg/dL — ABNORMAL HIGH (ref 70–99)
Glucose-Capillary: 143 mg/dL — ABNORMAL HIGH (ref 70–99)

## 2024-04-22 LAB — HEMOGLOBIN: Hemoglobin: 11.4 g/dL — ABNORMAL LOW (ref 12.0–15.0)

## 2024-04-22 LAB — D-DIMER, QUANTITATIVE: D-Dimer, Quant: 2.59 ug{FEU}/mL — ABNORMAL HIGH (ref 0.00–0.50)

## 2024-04-22 SURGERY — COLONOSCOPY
Anesthesia: General

## 2024-04-22 MED ORDER — FUROSEMIDE 40 MG PO TABS
40.0000 mg | ORAL_TABLET | Freq: Two times a day (BID) | ORAL | Status: DC
  Administered 2024-04-22: 40 mg via ORAL
  Filled 2024-04-22: qty 1

## 2024-04-22 MED ORDER — ALBUTEROL SULFATE (2.5 MG/3ML) 0.083% IN NEBU
INHALATION_SOLUTION | RESPIRATORY_TRACT | Status: AC
  Administered 2024-04-22: 5 mg
  Filled 2024-04-22: qty 3

## 2024-04-22 MED ORDER — POTASSIUM CHLORIDE CRYS ER 20 MEQ PO TBCR
40.0000 meq | EXTENDED_RELEASE_TABLET | Freq: Two times a day (BID) | ORAL | Status: DC
  Administered 2024-04-22: 40 meq via ORAL
  Filled 2024-04-22: qty 2

## 2024-04-22 MED ORDER — PROPOFOL 1000 MG/100ML IV EMUL
INTRAVENOUS | Status: AC
Start: 2024-04-22 — End: 2024-04-22
  Filled 2024-04-22: qty 100

## 2024-04-22 MED ORDER — ZINC OXIDE 40 % EX OINT
TOPICAL_OINTMENT | Freq: Two times a day (BID) | CUTANEOUS | Status: DC
  Administered 2024-04-23 – 2024-04-29 (×3): 1 via TOPICAL
  Filled 2024-04-22: qty 113

## 2024-04-22 MED ORDER — PROPOFOL 10 MG/ML IV BOLUS: INTRAVENOUS | Status: DC | PRN

## 2024-04-22 MED ORDER — LIDOCAINE HCL (PF) 2 % IJ SOLN
INTRAMUSCULAR | Status: AC
Start: 2024-04-22 — End: 2024-04-22
  Filled 2024-04-22: qty 5

## 2024-04-22 MED ORDER — PROPOFOL 10 MG/ML IV BOLUS
INTRAVENOUS | Status: DC | PRN
  Administered 2024-04-22: 20 mg via INTRAVENOUS
  Administered 2024-04-22: 125 ug/kg/min via INTRAVENOUS

## 2024-04-22 MED ORDER — FUROSEMIDE 10 MG/ML IJ SOLN
40.0000 mg | Freq: Once | INTRAMUSCULAR | Status: AC
  Administered 2024-04-22: 40 mg via INTRAVENOUS
  Filled 2024-04-22: qty 4

## 2024-04-22 MED ORDER — GUAIFENESIN 100 MG/5ML PO LIQD
5.0000 mL | ORAL | Status: DC | PRN
  Administered 2024-04-22 – 2024-05-03 (×3): 5 mL via ORAL
  Filled 2024-04-22 (×3): qty 10

## 2024-04-22 MED ORDER — PHENYLEPHRINE 80 MCG/ML (10ML) SYRINGE FOR IV PUSH (FOR BLOOD PRESSURE SUPPORT)
PREFILLED_SYRINGE | INTRAVENOUS | Status: DC | PRN
  Administered 2024-04-22 (×2): 160 ug via INTRAVENOUS

## 2024-04-22 MED ORDER — POTASSIUM CHLORIDE CRYS ER 20 MEQ PO TBCR
40.0000 meq | EXTENDED_RELEASE_TABLET | Freq: Two times a day (BID) | ORAL | Status: DC
  Administered 2024-04-22 – 2024-04-24 (×4): 40 meq via ORAL
  Filled 2024-04-22 (×4): qty 2

## 2024-04-22 MED ORDER — SODIUM CHLORIDE 0.9 % IV SOLN: INTRAVENOUS | Status: DC

## 2024-04-22 MED ORDER — POTASSIUM CHLORIDE 10 MEQ/100ML IV SOLN
10.0000 meq | INTRAVENOUS | Status: AC
  Administered 2024-04-22 (×4): 10 meq via INTRAVENOUS
  Filled 2024-04-22: qty 100

## 2024-04-22 MED ORDER — LIDOCAINE HCL (CARDIAC) PF 100 MG/5ML IV SOSY
PREFILLED_SYRINGE | INTRAVENOUS | Status: DC | PRN
  Administered 2024-04-22: 50 mg via INTRAVENOUS

## 2024-04-22 MED ORDER — LIDOCAINE 5 % EX PTCH
1.0000 | MEDICATED_PATCH | CUTANEOUS | Status: DC
  Administered 2024-04-22 – 2024-04-30 (×9): 1 via TRANSDERMAL
  Filled 2024-04-22 (×10): qty 1

## 2024-04-22 MED ORDER — ALBUTEROL SULFATE (2.5 MG/3ML) 0.083% IN NEBU
2.5000 mg | INHALATION_SOLUTION | RESPIRATORY_TRACT | Status: DC | PRN
  Administered 2024-04-23: 2.5 mg via RESPIRATORY_TRACT
  Filled 2024-04-22 (×2): qty 3

## 2024-04-22 NOTE — Anesthesia Preprocedure Evaluation (Signed)
 Anesthesia Evaluation  Patient identified by MRN, date of birth, ID band Patient awake    Reviewed: Allergy & Precautions, NPO status , Patient's Chart, lab work & pertinent test results  History of Anesthesia Complications Negative for: history of anesthetic complications  Airway Mallampati: III  TM Distance: <3 FB Neck ROM: full    Dental  (+) Poor Dentition, Missing, Chipped, Dental Advidsory Given   Pulmonary neg shortness of breath, asthma , sleep apnea , COPD, neg recent URI   Pulmonary exam normal        Cardiovascular hypertension, (-) angina + CAD and +CHF  (-) Past MI and (-) Cardiac Stents Normal cardiovascular exam(-) dysrhythmias (-) Valvular Problems/Murmurs     Neuro/Psych negative neurological ROS  negative psych ROS   GI/Hepatic Neg liver ROS,GERD  Controlled,,  Endo/Other  Hypothyroidism    Renal/GU Renal disease  negative genitourinary   Musculoskeletal   Abdominal   Peds  Hematology  (+) Blood dyscrasia, anemia   Anesthesia Other Findings Patient is NPO appropriate and reports no nausea or vomiting today.   Past Medical History: No date: Anemia No date: Angina pectoris No date: Arthritis No date: Asthma No date: Blood transfusion No date: Cancer [facility])     Comment:  right breast No date: CHF (congestive heart failure) (HCC) No date: Chronic cystitis No date: COPD (chronic obstructive pulmonary disease) (HCC) No date: GERD (gastroesophageal reflux disease) No date: Hyperlipidemia No date: Hypertension No date: Mitral valve disease No date: Sleep apnea No date: Thyroid  disease     Comment:  hypo  Past Surgical History: No date: ABDOMINAL HYSTERECTOMY No date: APPENDECTOMY No date: BACK SURGERY No date: BLADDER SURGERY No date: COLON SURGERY No date: FOOT SURGERY No date: HERNIA REPAIR No date: KIDNEY STONE SURGERY No date: KNEE SURGERY     Comment:  right knee No date:  MASTECTOMY     Comment:  right No date: OOPHORECTOMY     Comment:  right No date: PARATHYROIDECTOMY  BMI    Body Mass Index: 37.65 kg/m      Reproductive/Obstetrics negative OB ROS                              Anesthesia Physical Anesthesia Plan  ASA: 3  Anesthesia Plan: General   Post-op Pain Management:    Induction: Intravenous  PONV Risk Score and Plan: 3 and Propofol  infusion and TIVA  Airway Management Planned: Natural Airway and Nasal Cannula  Additional Equipment:   Intra-op Plan:   Post-operative Plan:   Informed Consent: I have reviewed the patients History and Physical, chart, labs and discussed the procedure including the risks, benefits and alternatives for the proposed anesthesia with the patient or authorized representative who has indicated his/her understanding and acceptance.     Dental Advisory Given  Plan Discussed with: Anesthesiologist, CRNA and Surgeon  Anesthesia Plan Comments: (Patient consented for risks of anesthesia including but not limited to:  - adverse reactions to medications - risk of airway placement if required - damage to eyes, teeth, lips or other oral mucosa - nerve damage due to positioning  - sore throat or hoarseness - Damage to heart, brain, nerves, lungs, other parts of body or loss of life  Patient voiced understanding and assent.)         Anesthesia Quick Evaluation

## 2024-04-22 NOTE — Progress Notes (Signed)
*  PRELIMINARY RESULTS* Echocardiogram 2D Echocardiogram has been performed.  Wendy [name] 04/22/2024, 7:59 AM

## 2024-04-22 NOTE — Care Management Obs Status (Signed)
 MEDICARE OBSERVATION STATUS NOTIFICATION   Patient Details  Name: Wendy [name] MRN: [id number] Date of Birth: January 16, 1941   Medicare Observation Status Notification Given:  Chaney BRANDY CHRISTIANE LELON, CMA 04/22/2024, 1:24 PM

## 2024-04-22 NOTE — Progress Notes (Signed)
 Pt in distress on cpap; kept coughing pink tinged sputum; pt

## 2024-04-22 NOTE — Transfer of Care (Signed)
 Immediate Anesthesia Transfer of Care Note  Patient: Wendy [name]  Procedure(s) Performed: COLONOSCOPY  Patient Location: Endoscopy Unit  Anesthesia Type:General  Level of Consciousness: awake, alert , and oriented  Airway & Oxygen  Therapy: Patient Spontanous Breathing  Post-op Assessment: Report given to RN and Post -op Vital signs reviewed and stable  Post vital signs: Reviewed and stable  Last Vitals:  Vitals Value Taken Time  BP 126/51 04/22/24 15:37  Temp 36.7 C 04/22/24 15:37  Pulse 91 04/22/24 15:37  Resp 20 04/22/24 15:37  SpO2 90 % 04/22/24 15:37  Vitals shown include unfiled device data.  Last Pain:  Vitals:   04/22/24 1537  TempSrc:   PainSc: 0-No pain      Patients Stated Pain Goal: 3 (04/21/24 2000)  Complications: No notable events documented.

## 2024-04-22 NOTE — Plan of Care (Signed)
  Problem: Clinical Measurements: Goal: Ability to maintain clinical measurements within normal limits will improve Outcome: Progressing   Problem: Activity: Goal: Risk for activity intolerance will decrease Outcome: Progressing   Problem: Pain Managment: Goal: General experience of comfort will improve and/or be controlled Outcome: Progressing   Problem: Safety: Goal: Ability to remain free from injury will improve Outcome: Progressing   Problem: Skin Integrity: Goal: Risk for impaired skin integrity will decrease Outcome: Progressing   Problem: Cardiac: Goal: Will achieve and/or maintain adequate cardiac output Outcome: Progressing   Problem: Physical Regulation: Goal: Complications related to the disease process, condition or treatment will be avoided or minimized Outcome: Progressing

## 2024-04-22 NOTE — Anesthesia Procedure Notes (Signed)
 Date/Time: 04/22/2024 1:52 PM  Performed by: Dominica Krabbe, CRNAPre-anesthesia Checklist: Patient identified, Emergency Drugs available, Suction available and Patient being monitored Patient Re-evaluated:Patient Re-evaluated prior to induction Oxygen  Delivery Method: Simple face mask Preoxygenation: Pre-oxygenation with 100% oxygen  Induction Type: IV induction

## 2024-04-22 NOTE — Op Note (Signed)
 [facility] Gastroenterology Patient Name: Wendy [name] Procedure Date: 04/22/2024 1:25 PM MRN: [id number] Account #: [id number] Date of Birth: 01-13-1941 Admit Type: Inpatient Age: 83 Room: [facility] ENDO ROOM 1 Gender: Female Note Status: Finalized Instrument Name: Colon Scope [id number] Procedure:             Colonoscopy Indications:           Chronic diarrhea Providers:             Rogelia Copping MD, MD Referring MD:          [facility] Medicines:             Propofol  per Anesthesia Complications:         No immediate complications. Procedure:             Pre-Anesthesia Assessment:                        - Prior to the procedure, a History and Physical was                         performed, and patient medications and allergies were                         reviewed. The patient's tolerance of previous                         anesthesia was also reviewed. The risks and benefits                         of the procedure and the sedation options and risks                         were discussed with the patient. All questions were                         answered, and informed consent was obtained. Prior                         Anticoagulants: The patient has taken no anticoagulant                         or antiplatelet agents. ASA Grade Assessment: II - A                         patient with mild systemic disease. After reviewing                         the risks and benefits, the patient was deemed in                         satisfactory condition to undergo the procedure.                        After obtaining informed consent, the colonoscope was                         passed under direct vision. Throughout the procedure,  the patient's blood pressure, pulse, and oxygen                          saturations were monitored continuously. The                         Colonoscope was introduced through the anus and                          advanced to the the terminal ileum. The colonoscopy                         was performed without difficulty. The patient                         tolerated the procedure well. The quality of the bowel                         preparation was excellent. Findings:      The perianal and digital rectal examinations were normal.      The terminal ileum appeared normal. Biopsies were taken with a cold       forceps for histology.      Non-bleeding internal hemorrhoids were found during retroflexion. The       hemorrhoids were Grade II (internal hemorrhoids that prolapse but reduce       spontaneously).      Biopsies were taken with a cold forceps in the entire colon for       histology. Impression:            - The examined portion of the ileum was normal.                         Biopsied.                        - Non-bleeding internal hemorrhoids.                        - Biopsies were taken with a cold forceps for                         histology in the entire colon. Recommendation:        - Return patient to hospital ward for ongoing care.                        - Resume previous diet.                        - Continue present medications.                        - Await pathology results.                        - The patient may foollow up as an outpatient for                         results. Procedure Code(s):     --- Professional ---  54619, Colonoscopy, flexible; with biopsy, single or                         multiple Diagnosis Code(s):     --- Professional ---                        K52.9, Noninfective gastroenteritis and colitis,                         unspecified CPT copyright 2022 American Medical Association. All rights reserved. The codes documented in this report are preliminary and upon coder review may  be revised to meet current compliance requirements. Rogelia Copping MD, MD 04/22/2024 3:29:58 PM This report has been signed  electronically. Number of Addenda: 0 Note Initiated On: 04/22/2024 1:25 PM Scope Withdrawal Time: 0 hours 5 minutes 10 seconds  Total Procedure Duration: 0 hours 7 minutes 21 seconds  Estimated Blood Loss:  Estimated blood loss: none.      [facility]

## 2024-04-22 NOTE — Assessment & Plan Note (Signed)
 Secondary to acute kidney injury on CKD.  Anion gap improved

## 2024-04-22 NOTE — Progress Notes (Signed)
 Progress Note   Patient: Wendy [name] FMW:[id number] DOB: 09-06-1940 DOA: 04/20/2024     0 DOS: the patient was seen and examined on 04/22/2024   Brief hospital course: 83 y.o. 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  She denies abdominal pain or vomiting she was hypotensive to the 60s with EMS and was placed on Levophed which was discontinued soon after arrival in the ED. On arrival in the ED, BP 83/57 with otherwise normal vitals Labs notable for normal WBC, hemoglobin 11.9 (baseline 12).  CMP notable for potassium of 2.5 with creatinine of 2.02 (baseline 1.5) Lipase and LFTs WNL and magnesium normal Stool culture ordered from the ED Patient treated with IV potassium Admission requested.   11/20.  Patient having diarrhea for 3 weeks.  Stool studies sent off and negative.  EGD negative.  Prepping for colonoscopy and this evening will have a colonoscopy tomorrow  Assessment and Plan: * Diarrhea Stool studies negative.  Colonoscopy appeared normal but biopsies were taken.  Need to rule out microscopic colitis  Melena EGD negative.  Patient was spitting up some bright red blood this morning.  Hypokalemia Potassium 2.9 this morning.  Aggressive replacement IV and p.o.  Increased anion gap metabolic acidosis Secondary to acute kidney injury on CKD  Acute renal failure superimposed on stage 3b chronic kidney disease (HCC) Secondary to GI losses resulting in hypoperfusion with ATN Metabolic acidosis. Today's creatinine 1.32 with a GFR of 40.  On presentation creatinine 2.02  Hypotension Blood pressure improved  Recurrent syncope Likely secondary to dehydration,  hypotension and hypokalemia  Chronic diastolic heart failure (HCC) Patient short of breath with ambulation restart Lasix .   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  Encouraged to finish the rest of the prep.  States she has been having diarrhea for 3 weeks at least.  Potassium is 2.9 this morning.  IV and oral potassium ordered.  Physical Exam: Vitals:   04/22/24 1536 04/22/24 1537 04/22/24 1547 04/22/24 1617  BP:  (!) 126/51 (!) 149/91 (!) 142/68  Pulse: 89 90 91 92  Resp: 19 (!) 22 20 16   Temp:  98 F (36.7 C)  98.4 F (36.9 C)  TempSrc:    Oral  SpO2: 91% 91% 90% 96%  Weight:       Physical Exam HENT:     Head: Normocephalic.     Mouth/Throat:     Pharynx: No oropharyngeal exudate.  Eyes:     General: Lids are normal.     Conjunctiva/sclera: Conjunctivae normal.  Cardiovascular:     Rate and Rhythm: Normal rate and regular rhythm.     Heart sounds: Normal heart sounds, S1 normal and S2 normal.  Pulmonary:     Breath sounds: No decreased breath sounds, wheezing, rhonchi or rales.  Abdominal:     Palpations: Abdomen is soft.  Tenderness: There is abdominal tenderness in the epigastric area.  Musculoskeletal:     Right lower leg: Swelling present.     Left lower leg: Swelling present.  Skin:    General: Skin is warm.     Findings: No rash.  Neurological:     Mental Status: She is alert and oriented to person, place, and time.     Data Reviewed: Potassium 2.9, creatinine 1.32, sodium 142, GFR 40, anion gap 12, hemoglobin 11.4, repeat potassium  3.5  Family Communication: Tried to contact POA but mailbox full.  Disposition: Status is: Observation Since patient felt awful and procedure was on the later side will watch today in the hospital.  Wondering if patient has microcytic colitis causing her diarrhea.  Notified that she has shortness of breath with ambulation.  Will check pulse ox with ambulation and restart her Lasix .  Planned Discharge Destination: Home    Time spent: 28 minutes  Author: Charlie Patterson, MD 04/22/2024 5:04 PM  For on call review [email or web address].

## 2024-04-22 NOTE — Progress Notes (Incomplete)
       CROSS COVER NOTE  NAME: Wendy [name] MRN: [id number] DOB : [birth year]    Concern as stated by nurse / staff   Patient had colonoscopy earlier.  Complaining of shortness of breath, tachypneic.  Got Lasix  at around 4 PM     Pertinent findings on chart review: Patient admitted with acute on chronic diarrhea s/p colonoscopy earlier.  History of CHF.  Developed shortness of breath in the afternoon and was treated with a one-time dose of Lasix   Patient Assessment Patient evaluated at bedside, awake but very tachypneic with increased work of breathing.  One-word answers head of bed elevated  99 F (37.2 C) 114 Abnormal  -- 119/53 Abnormal  99 %  -- 52 Abnormal  -- 195/121 Abnormal  --  98.6 F (37 C) 83 16 130/54 Abnormal  91 %  Physical Exam Vitals and nursing note reviewed.  Constitutional:      General: She is in acute distress.  Cardiovascular:     Rate and Rhythm: Regular rhythm. Tachycardia present.     Heart sounds: Normal heart sounds.  Pulmonary:     Effort: Tachypnea, accessory muscle usage and prolonged expiration present.     Breath sounds: Wheezing present.  Abdominal:     Palpations: Abdomen is soft.     Tenderness: There is no abdominal tenderness.  Neurological:     General: No focal deficit present.     Diagnostic workup -WBC 15,000, lactic acid 3.1--> 2.3 -Troponin 41-> 79 -BMP unremarkable -Hemoglobin at baseline - D-dimer 2.59 - Chest x-ray: Left lung airspace opacity 9 x 4 cm CTA chest IMPRESSION: 1. Large lobar left upper lobe pneumonia. Less pronounced bilateral middle lobe pneumonias, and left lower lobe atelectasis vs pneumonia. Trace left pleural effusion. 2. Underlying huge hiatal hernia with intrathoracic stomach - with associated mass effect and architectural distortion in the left lower lobe chest. 3. And Underlying chronic lung disease, perhaps in part post-radiation scarring. 4. No pulmonary embolism identified.    Treatment  administered -IV Lasix  -DuoNebs - Supplemental oxygen    Reassessment Patient is improved with administered treatment    04/23/2024   12:00 AM 04/22/2024   10:56 PM 04/22/2024    8:34 PM  Vitals with BMI  Systolic 119 195 869  Diastolic 53 121 54  Pulse 114 52 83     Assessment and  Interventions   Assessment:  --Acute respiratory failure with hypoxia, etiology uncertain (Differential: Pneumonia, CHF exacerbation, PE, lung mass) -- Left lobar pneumonia -- Intrathoracic hiatal hernia  Plan: Follow-up CTA chest-->Large left lobar pneumonia, negative for mass, negative for PE Starting Rocephin  and azithromycin  Continue as needed DuoNeb and Lasix  pending further evaluation Will place n.p.o. SLP consult Aspiration precautions Continue supplemental oxygen       CRITICAL CARE Performed by: Delayne LULLA Solian   Total critical care time: 120 minutes  Critical care time was exclusive of separately billable procedures and treating other patients.  Critical care was necessary to treat or prevent imminent or life-threatening deterioration.  Critical care was time spent personally by me on the following activities: development of treatment plan with patient and/or surrogate as well as nursing, discussions with consultants, evaluation of patient's response to treatment, examination of patient, obtaining history from patient or surrogate, ordering and performing treatments and interventions, ordering and review of laboratory studies, ordering and review of radiographic studies, pulse oximetry and re-evaluation of patient's condition.

## 2024-04-23 ENCOUNTER — Observation Stay

## 2024-04-23 DIAGNOSIS — A419 Sepsis, unspecified organism: Secondary | ICD-10-CM | POA: Diagnosis not present

## 2024-04-23 DIAGNOSIS — J181 Lobar pneumonia, unspecified organism: Secondary | ICD-10-CM

## 2024-04-23 DIAGNOSIS — I4891 Unspecified atrial fibrillation: Secondary | ICD-10-CM | POA: Diagnosis not present

## 2024-04-23 DIAGNOSIS — E785 Hyperlipidemia, unspecified: Secondary | ICD-10-CM

## 2024-04-23 DIAGNOSIS — J189 Pneumonia, unspecified organism: Secondary | ICD-10-CM

## 2024-04-23 DIAGNOSIS — J9601 Acute respiratory failure with hypoxia: Secondary | ICD-10-CM | POA: Diagnosis not present

## 2024-04-23 DIAGNOSIS — R6520 Severe sepsis without septic shock: Secondary | ICD-10-CM

## 2024-04-23 DIAGNOSIS — I5033 Acute on chronic diastolic (congestive) heart failure: Secondary | ICD-10-CM

## 2024-04-23 DIAGNOSIS — R042 Hemoptysis: Secondary | ICD-10-CM

## 2024-04-23 DIAGNOSIS — E8729 Other acidosis: Secondary | ICD-10-CM

## 2024-04-23 DIAGNOSIS — R197 Diarrhea, unspecified: Secondary | ICD-10-CM | POA: Diagnosis not present

## 2024-04-23 LAB — MRSA NEXT GEN BY PCR, NASAL: MRSA by PCR Next Gen: NOT DETECTED

## 2024-04-23 LAB — BASIC METABOLIC PANEL WITH GFR
Anion gap: 12 (ref 5–15)
Anion gap: 15 (ref 5–15)
BUN: 19 mg/dL (ref 8–23)
BUN: 20 mg/dL (ref 8–23)
CO2: 22 mmol/L (ref 22–32)
CO2: 24 mmol/L (ref 22–32)
Calcium: 8.5 mg/dL — ABNORMAL LOW (ref 8.9–10.3)
Calcium: 8.8 mg/dL — ABNORMAL LOW (ref 8.9–10.3)
Chloride: 103 mmol/L (ref 98–111)
Chloride: 104 mmol/L (ref 98–111)
Creatinine, Ser: 1.3 mg/dL — ABNORMAL HIGH (ref 0.44–1.00)
Creatinine, Ser: 1.33 mg/dL — ABNORMAL HIGH (ref 0.44–1.00)
GFR, Estimated: 40 mL/min — ABNORMAL LOW (ref >60)
GFR, Estimated: 41 mL/min — ABNORMAL LOW (ref >60)
Glucose, Bld: 134 mg/dL — ABNORMAL HIGH (ref 70–99)
Glucose, Bld: 135 mg/dL — ABNORMAL HIGH (ref 70–99)
Potassium: 3.4 mmol/L — ABNORMAL LOW (ref 3.5–5.1)
Potassium: 3.6 mmol/L (ref 3.5–5.1)
Sodium: 140 mmol/L (ref 135–145)
Sodium: 140 mmol/L (ref 135–145)

## 2024-04-23 LAB — CBC
HCT: 33.3 % — ABNORMAL LOW (ref 36.0–46.0)
Hemoglobin: 11.1 g/dL — ABNORMAL LOW (ref 12.0–15.0)
MCH: 32.6 pg (ref 26.0–34.0)
MCHC: 33.3 g/dL (ref 30.0–36.0)
MCV: 97.7 fL (ref 80.0–100.0)
Platelets: 206 K/uL (ref 150–400)
RBC: 3.41 MIL/uL — ABNORMAL LOW (ref 3.87–5.11)
RDW: 13.7 % (ref 11.5–15.5)
WBC: 13.1 K/uL — ABNORMAL HIGH (ref 4.0–10.5)
nRBC: 0 % (ref 0.0–0.2)

## 2024-04-23 LAB — BLOOD GAS, ARTERIAL
Acid-Base Excess: 0.7 mmol/L (ref 0.0–2.0)
Bicarbonate: 25.4 mmol/L (ref 20.0–28.0)
O2 Content: 15 L/min
O2 Saturation: 99.1 %
Patient temperature: 37
pCO2 arterial: 40 mmHg (ref 32–48)
pH, Arterial: 7.41 (ref 7.35–7.45)
pO2, Arterial: 176 mmHg — ABNORMAL HIGH (ref 83–108)

## 2024-04-23 LAB — PRO BRAIN NATRIURETIC PEPTIDE: Pro Brain Natriuretic Peptide: 7797 pg/mL — ABNORMAL HIGH (ref <300.0)

## 2024-04-23 LAB — RESP PANEL BY RT-PCR (RSV, FLU A&B, COVID)  RVPGX2
Influenza A by PCR: NEGATIVE
Influenza B by PCR: NEGATIVE
Resp Syncytial Virus by PCR: NEGATIVE
SARS Coronavirus 2 by RT PCR: NEGATIVE

## 2024-04-23 LAB — LACTIC ACID, PLASMA
Lactic Acid, Venous: 3.1 mmol/L (ref 0.5–1.9)
Lactic Acid, Venous: 2.3 mmol/L (ref 0.5–1.9)

## 2024-04-23 LAB — PHOSPHORUS: Phosphorus: 2.6 mg/dL (ref 2.5–4.6)

## 2024-04-23 LAB — TROPONIN T, HIGH SENSITIVITY
Troponin T High Sensitivity: 41 ng/L — ABNORMAL HIGH (ref 0–19)
Troponin T High Sensitivity: 79 ng/L — ABNORMAL HIGH (ref 0–19)

## 2024-04-23 LAB — GLUCOSE, CAPILLARY: Glucose-Capillary: 131 mg/dL — ABNORMAL HIGH (ref 70–99)

## 2024-04-23 LAB — EXPECTORATED SPUTUM ASSESSMENT W GRAM STAIN, RFLX TO RESP C

## 2024-04-23 LAB — HIV ANTIBODY (ROUTINE TESTING W REFLEX): HIV Screen 4th Generation wRfx: NONREACTIVE

## 2024-04-23 LAB — STREP PNEUMONIAE URINARY ANTIGEN: Strep Pneumo Urinary Antigen: POSITIVE — AB

## 2024-04-23 LAB — MAGNESIUM: Magnesium: 1.8 mg/dL (ref 1.7–2.4)

## 2024-04-23 LAB — TSH: TSH: 0.378 u[IU]/mL (ref 0.350–4.500)

## 2024-04-23 MED ORDER — FUROSEMIDE 10 MG/ML IJ SOLN
40.0000 mg | Freq: Two times a day (BID) | INTRAMUSCULAR | Status: DC
  Administered 2024-04-23: 40 mg via INTRAVENOUS
  Filled 2024-04-23: qty 4

## 2024-04-23 MED ORDER — HYDROCORTISONE SOD SUC (PF) 100 MG IJ SOLR
100.0000 mg | Freq: Two times a day (BID) | INTRAMUSCULAR | Status: DC
  Administered 2024-04-23 – 2024-04-25 (×5): 100 mg via INTRAVENOUS
  Filled 2024-04-23 (×6): qty 2

## 2024-04-23 MED ORDER — SODIUM CHLORIDE 0.9 % IV SOLN
500.0000 mg | INTRAVENOUS | Status: DC
  Administered 2024-04-23: 500 mg via INTRAVENOUS
  Filled 2024-04-23: qty 5

## 2024-04-23 MED ORDER — AMIODARONE HCL IN DEXTROSE 360-4.14 MG/200ML-% IV SOLN
60.0000 mg/h | INTRAVENOUS | Status: AC
  Administered 2024-04-23 (×2): 60 mg/h via INTRAVENOUS
  Filled 2024-04-23 (×2): qty 200

## 2024-04-23 MED ORDER — SODIUM CHLORIDE 0.9 % IV BOLUS: 500.0000 mL | Freq: Once | INTRAVENOUS | Status: DC

## 2024-04-23 MED ORDER — IOHEXOL 350 MG/ML SOLN
75.0000 mL | Freq: Once | INTRAVENOUS | Status: AC | PRN
  Administered 2024-04-23: 75 mL via INTRAVENOUS

## 2024-04-23 MED ORDER — ALLOPURINOL 100 MG PO TABS
100.0000 mg | ORAL_TABLET | Freq: Every day | ORAL | Status: DC
  Administered 2024-04-23 – 2024-04-26 (×4): 100 mg via ORAL
  Filled 2024-04-23 (×4): qty 1

## 2024-04-23 MED ORDER — SODIUM CHLORIDE 0.9 % IV SOLN
100.0000 mg | Freq: Two times a day (BID) | INTRAVENOUS | Status: DC
  Administered 2024-04-23 – 2024-04-26 (×6): 100 mg via INTRAVENOUS
  Filled 2024-04-23 (×6): qty 100

## 2024-04-23 MED ORDER — METOPROLOL TARTRATE 25 MG PO TABS
25.0000 mg | ORAL_TABLET | Freq: Two times a day (BID) | ORAL | Status: DC
  Administered 2024-04-23: 25 mg via ORAL
  Filled 2024-04-23: qty 1

## 2024-04-23 MED ORDER — GABAPENTIN 300 MG PO CAPS
300.0000 mg | ORAL_CAPSULE | Freq: Three times a day (TID) | ORAL | Status: DC
  Administered 2024-04-23 – 2024-05-03 (×32): 300 mg via ORAL
  Filled 2024-04-23 (×33): qty 1

## 2024-04-23 MED ORDER — AMIODARONE HCL IN DEXTROSE 360-4.14 MG/200ML-% IV SOLN
30.0000 mg/h | INTRAVENOUS | Status: DC
  Administered 2024-04-23 – 2024-04-24 (×2): 30 mg/h via INTRAVENOUS
  Filled 2024-04-23: qty 200

## 2024-04-23 MED ORDER — SODIUM CHLORIDE 0.9 % IV BOLUS
500.0000 mL | Freq: Once | INTRAVENOUS | Status: AC
  Administered 2024-04-23: 500 mL via INTRAVENOUS

## 2024-04-23 MED ORDER — AMIODARONE LOAD VIA INFUSION
150.0000 mg | Freq: Once | INTRAVENOUS | Status: AC
  Administered 2024-04-23: 150 mg via INTRAVENOUS
  Filled 2024-04-23: qty 83.34

## 2024-04-23 MED ORDER — PIPERACILLIN-TAZOBACTAM 3.375 G IVPB
3.3750 g | Freq: Three times a day (TID) | INTRAVENOUS | Status: AC
  Administered 2024-04-23 – 2024-04-25 (×7): 3.375 g via INTRAVENOUS
  Filled 2024-04-23 (×7): qty 50

## 2024-04-23 MED ORDER — METOPROLOL TARTRATE 5 MG/5ML IV SOLN
5.0000 mg | INTRAVENOUS | Status: DC | PRN
  Administered 2024-04-23: 5 mg via INTRAVENOUS
  Filled 2024-04-23: qty 5

## 2024-04-23 MED ORDER — SODIUM CHLORIDE 0.9 % IV SOLN
2.0000 g | INTRAVENOUS | Status: DC
  Administered 2024-04-23: 2 g via INTRAVENOUS
  Filled 2024-04-23: qty 20

## 2024-04-23 NOTE — Assessment & Plan Note (Signed)
 PRN iv metoprolol  and po metoprolol .  Transfer to progressive care

## 2024-04-23 NOTE — Consult Note (Signed)
 NAME:  Wendy [name], MRN:  [id number], DOB:  1941/06/02, LOS: 0 ADMISSION DATE:  04/20/2024, CONSULTATION DATE:  04/23/2024 REFERRING MD:  Dr. Josette, CHIEF COMPLAINT:  Pneumonia   History of Present Illness:  Wendy [name] is a pleasant 83 year old female patient with a past medical history of chronic heart failure with preserved EF, hypertension, CAD, cardiogenic syncope status post ICD in 10/20/2022, CKD stage IIIa, hypothyroidism, OSA chronic diarrhea for the past 3 months with negative outpatient workup admitted to [facility] on 11/19 with ongoing diarrhea and possible melena.  She was found to be hypotensive but responsive to fluids.  Labs on arrival with normal white count.  Normal lactate at 1.9.  Significant hypokalemia.  The AKI with creatinine 2.02 mg/dL from a baseline of 1.1 to 1.4 mg/dL.  She was resuscitated with fluids.  Underwent colonoscopy on 11/21 that appeared normal.  Biopsies were taken from random parts of the colon to rule out microscopic colitis.  Overnight on 11/21 patient became tachypneic which prompted a chest x-ray that showed a left consolidation.  The latter prompted a CTA chest on 11/22 that showed a large lobar left upper lobe consolidation.  Pulmonary team is being consulted for help with further management.  Pertinent  Medical History  As above.  Interim History / Subjective:  Patient in no acute distress but short of breath when she talks.   Objective    Blood pressure 105/61, pulse 98, temperature 98.6 F (37 C), temperature source Oral, resp. rate 18, height 5' 6 (1.676 m), weight 99.2 kg, SpO2 97%.    FiO2 (%):  [28 %] 28 % Pressure Support:  [5 cmH20] 5 cmH20   Intake/Output Summary (Last 24 hours) at 04/23/2024 1404 Last data filed at 04/23/2024 1232 Gross per 24 hour  Intake 233 ml  Output 250 ml  Net -17 ml   Filed Weights   04/21/24 0450 04/22/24 0500 04/23/24 0446  Weight: 105.8 kg 104.1 kg 99.2 kg    Examination: General: NAD,  obese HENT: Supple neck, reactive pupils Lungs: Diminished over the left hemithorax Cardiovascular: Irregularly irregular rhythm Abdomen: Soft, non tender, non distended Extremities: No edema  Labs and imaging were reviewed.   Assessment and Plan  Wendy [name] is a pleasant 83 year old female patient with a past medical history of chronic heart failure with preserved EF, hypertension, CAD, cardiogenic syncope status post ICD in 10/20/2022, CKD stage IIIa, hypothyroidism, OSA chronic diarrhea for the past 3 months with negative outpatient workup admitted to [facility] on 11/19 with ongoing diarrhea and possible melena.  She was found to be hypotensive but responsive to fluids. Found to have a lobar pneumonia for which pulmonary is being consulted.   #Lobar pnuemonia likely came in with that but we do nto have a CXR on presentation. Doubt this is secondary to aspiration. However give she has been admitted for 48hours, would manage this as hospital acquired and cover her for pseudomnas and MRSA.  #A.fib  #Subacute/Chronic diarrhea s/p Colonoscopy.   []  Sputum cultures, MRSA swb, urine strep and legionella Ag.  []  Agree with Azithromycin  and zosyn  and if MRSA positive can add vanco []  Hydrocortisone  100mg  IV bid per CAPE COD trial.  []  Titrate SpO2 > 90%.   Pulmonary will continue to follow.   I personally spent a total of 80 minutes in the care of the patient today including preparing to see the patient, getting/reviewing separately obtained history, performing a medically appropriate exam/evaluation, counseling and educating, placing orders, documenting clinical  information in the EHR, independently interpreting results, and communicating results.   Darrin Barn, MD [facility] 04/23/2024 2:09 PM

## 2024-04-23 NOTE — Progress Notes (Signed)
 Blood pressure on the lower side in the 80s and heart rate still high.  Will discontinue metoprolol  and start amiodarone  drip.  Will give a fluid bolus.  Consulted pulmonary for coughing up blood.  Started on steroids.  With starting amiodarone  will have to switch Zithromax  over to doxycycline .  Switch Rocephin  over to Zosyn .  Patient on bubble high flow nasal cannula.  Physical Exam HENT:     Head: Normocephalic.     Mouth/Throat:     Pharynx: No oropharyngeal exudate.  Eyes:     General: Lids are normal.     Conjunctiva/sclera: Conjunctivae normal.  Cardiovascular:     Rate and Rhythm: Tachycardia present. Rhythm irregularly irregular.     Heart sounds: Normal heart sounds, S1 normal and S2 normal.  Pulmonary:     Effort: Accessory muscle usage present.     Breath sounds: Examination of the right-lower field reveals decreased breath sounds and rhonchi. Examination of the left-lower field reveals decreased breath sounds and rhonchi. Decreased breath sounds and rhonchi present. No wheezing or rales.  Abdominal:     Palpations: Abdomen is soft.     Tenderness: There is abdominal tenderness in the epigastric area.  Musculoskeletal:     Right lower leg: Swelling present.     Left lower leg: Swelling present.  Skin:    General: Skin is warm.     Findings: No rash.  Neurological:     Mental Status: She is alert and oriented to person, place, and time.     Another 15 minutes spent with patient. Case discussed with pulmonary. Dr. Charlie Patterson

## 2024-04-23 NOTE — Progress Notes (Addendum)
 Pt had abnormal reports;  lactic acid 3.1, troponin 79. MD notified/ aware. Pt c/o head pain,  med given. Pt stated feeling a lot better trying to sleep.

## 2024-04-23 NOTE — Progress Notes (Signed)
 Progress Note   Patient: Wendy [name] FMW:[id number] DOB: 1941/02/09 DOA: 04/20/2024     0 DOS: the patient was seen and examined on 04/23/2024   Brief hospital course: 83 y.o. 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  She denies abdominal pain or vomiting she was hypotensive to the 60s with EMS and was placed on Levophed which was discontinued soon after arrival in the ED. On arrival in the ED, BP 83/57 with otherwise normal vitals Labs notable for normal WBC, hemoglobin 11.9 (baseline 12).  CMP notable for potassium of 2.5 with creatinine of 2.02 (baseline 1.5) Lipase and LFTs WNL and magnesium normal Stool culture ordered from the ED Patient treated with IV potassium Admission requested.   11/20.  Patient having diarrhea for 3 weeks.  Stool studies sent off and negative.  EGD negative.  Prepping for colonoscopy and this evening will have a colonoscopy tomorrow  Assessment and Plan: * Severe sepsis (HCC) Patient with pneumonia, tachycardia, tachypnea leukocytosis and lactic acidosis.  Will hold on fluid bolus at this point with her chf hx. IV rocephin  and zithromax .  BC sputum culture  Hemoptysis Suspect from pneumonia  Acute respiratory failure with hypoxia (HCC) Pulse ox 88% on room air.  Oxygen  supplemenatation  Rapid atrial fibrillation (HCC) PRN iv metoprolol  and po metoprolol .  Transfer to progressive care  Acute on chronic diastolic CHF (congestive heart failure) (HCC) Continue lasix  with high bnp and shortness of breath   Hypokalemia Oral potassium supplementation  Increased anion gap metabolic acidosis Secondary to acute kidney injury on CKD.  Anion  gap improved  Acute renal failure superimposed on stage 3b chronic kidney disease (HCC) Secondary to GI losses resulting in hypoperfusion with ATN Metabolic acidosis. Today's creatinine 1.33 with a GFR of 40.  On presentation creatinine 2.02  Hypotension Blood pressure improved  Diarrhea Stool studies negative.  Colonoscopy appeared normal but biopsies were taken.  Need to rule out microscopic colitis  Recurrent syncope Likely secondary to dehydration, hypotension and hypokalemia  Melena EGD negative.    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  Patient diagnosised with pneumonia overnight.  Patient coughing up blood.  Not feeling well.  Physical Exam: Vitals:   04/22/24 2256 04/23/24 0000 04/23/24 0441 04/23/24 0446  BP: (!) 195/121 (!) 119/53 (!) 122/51   Pulse: (!) 52 (!) 114 95   Resp:      Temp:  99 F (37.2 C) 97.6 F (36.4 C)   TempSrc:  Axillary Oral   SpO2:  99% 100%   Weight:    99.2 kg   Physical Exam HENT:     Head: Normocephalic.     Mouth/Throat:     Pharynx: No oropharyngeal exudate.  Eyes:     General: Lids are normal.     Conjunctiva/sclera: Conjunctivae normal.  Cardiovascular:     Rate  and Rhythm: Tachycardia present. Rhythm irregularly irregular.     Heart sounds: Normal heart sounds, S1 normal and S2 normal.  Pulmonary:     Effort: Accessory muscle usage present.     Breath sounds: Examination of the right-lower field reveals decreased breath sounds and rhonchi. Examination of the left-lower field reveals decreased breath sounds  and rhonchi. Decreased breath sounds and rhonchi present. No wheezing or rales.  Abdominal:     Palpations: Abdomen is soft.     Tenderness: There is abdominal tenderness in the epigastric area.  Musculoskeletal:     Right lower leg: Swelling present.     Left lower leg: Swelling present.  Skin:    General: Skin is warm.     Findings: No rash.  Neurological:     Mental Status: She is alert and oriented to person, place, and time.     Data Reviewed: Hb 11.1, potassium 3.6 cr 1.33  Family Communication: left messages  Disposition: Status is: Observation Transfer to progressive care.  Start antibiotics iv, po and iv metoprolol   Planned Discharge Destination: Home with Home Health    Critical care Time spent: 38 minutes  Author: Charlie Patterson, MD 04/23/2024 8:10 AM  For on call review [email or web address].

## 2024-04-23 NOTE — Anesthesia Postprocedure Evaluation (Signed)
 Anesthesia Post Note  Patient: ZAELYN NOACK  Procedure(s) Performed: COLONOSCOPY  Patient location during evaluation: Endoscopy Anesthesia Type: General Level of consciousness: awake and alert Pain management: pain level controlled Vital Signs Assessment: post-procedure vital signs reviewed and stable Respiratory status: spontaneous breathing, nonlabored ventilation, respiratory function stable and patient connected to nasal cannula oxygen  Cardiovascular status: blood pressure returned to baseline and stable Postop Assessment: no apparent nausea or vomiting Anesthetic complications: no   No notable events documented.   Last Vitals:  Vitals:   04/23/24 0000 04/23/24 0441  BP: (!) 119/53 (!) 122/51  Pulse: (!) 114 95  Resp:    Temp: 37.2 C 36.4 C  SpO2: 99% 100%    Last Pain:  Vitals:   04/23/24 0441  TempSrc: Oral  PainSc:                  Prentice Murphy

## 2024-04-23 NOTE — Plan of Care (Signed)
  Problem: Clinical Measurements: Goal: Ability to maintain clinical measurements within normal limits will improve Outcome: Progressing   Problem: Activity: Goal: Risk for activity intolerance will decrease Outcome: Progressing   Problem: Pain Managment: Goal: General experience of comfort will improve and/or be controlled Outcome: Progressing   Problem: Safety: Goal: Ability to remain free from injury will improve Outcome: Progressing   Problem: Cardiac: Goal: Will achieve and/or maintain adequate cardiac output Outcome: Progressing   Problem: Physical Regulation: Goal: Complications related to the disease process, condition or treatment will be avoided or minimized Outcome: Progressing

## 2024-04-23 NOTE — Assessment & Plan Note (Signed)
 Pulse ox 88% on room air.  Patient was on nonrebreather yesterday and switched over to bubble high flow nasal cannula.  Today on 5 L.

## 2024-04-23 NOTE — Assessment & Plan Note (Signed)
 Patient with pneumonia, tachycardia, tachypnea leukocytosis and lactic acidosis.  Will hold on fluid bolus at this point with her chf hx. IV rocephin  and zithromax .  BC sputum culture

## 2024-04-23 NOTE — Assessment & Plan Note (Signed)
 Suspect from pneumonia.  Pulmonary thinks this will improve with treatment of pneumonia

## 2024-04-24 DIAGNOSIS — R042 Hemoptysis: Secondary | ICD-10-CM | POA: Diagnosis not present

## 2024-04-24 DIAGNOSIS — R197 Diarrhea, unspecified: Secondary | ICD-10-CM | POA: Diagnosis not present

## 2024-04-24 DIAGNOSIS — I5033 Acute on chronic diastolic (congestive) heart failure: Secondary | ICD-10-CM

## 2024-04-24 DIAGNOSIS — J9601 Acute respiratory failure with hypoxia: Secondary | ICD-10-CM | POA: Diagnosis not present

## 2024-04-24 DIAGNOSIS — A419 Sepsis, unspecified organism: Secondary | ICD-10-CM | POA: Diagnosis not present

## 2024-04-24 LAB — TROPONIN T, HIGH SENSITIVITY
Troponin T High Sensitivity: 38 ng/L — ABNORMAL HIGH (ref 0–19)
Troponin T High Sensitivity: 34 ng/L — ABNORMAL HIGH (ref 0–19)

## 2024-04-24 LAB — BASIC METABOLIC PANEL WITH GFR
Anion gap: 11 (ref 5–15)
BUN: 33 mg/dL — ABNORMAL HIGH (ref 8–23)
CO2: 23 mmol/L (ref 22–32)
Calcium: 8.7 mg/dL — ABNORMAL LOW (ref 8.9–10.3)
Chloride: 102 mmol/L (ref 98–111)
Creatinine, Ser: 1.61 mg/dL — ABNORMAL HIGH (ref 0.44–1.00)
GFR, Estimated: 32 mL/min — ABNORMAL LOW (ref >60)
Glucose, Bld: 112 mg/dL — ABNORMAL HIGH (ref 70–99)
Potassium: 4.7 mmol/L (ref 3.5–5.1)
Sodium: 136 mmol/L (ref 135–145)

## 2024-04-24 LAB — CBC
HCT: 33.4 % — ABNORMAL LOW (ref 36.0–46.0)
Hemoglobin: 10.8 g/dL — ABNORMAL LOW (ref 12.0–15.0)
MCH: 32.5 pg (ref 26.0–34.0)
MCHC: 32.3 g/dL (ref 30.0–36.0)
MCV: 100.6 fL — ABNORMAL HIGH (ref 80.0–100.0)
Platelets: 218 K/uL (ref 150–400)
RBC: 3.32 MIL/uL — ABNORMAL LOW (ref 3.87–5.11)
RDW: 13.6 % (ref 11.5–15.5)
WBC: 13.9 K/uL — ABNORMAL HIGH (ref 4.0–10.5)
nRBC: 0 % (ref 0.0–0.2)

## 2024-04-24 LAB — STREP PNEUMONIAE URINARY ANTIGEN: Strep Pneumo Urinary Antigen: POSITIVE — AB

## 2024-04-24 LAB — GLUCOSE, CAPILLARY: Glucose-Capillary: 121 mg/dL — ABNORMAL HIGH (ref 70–99)

## 2024-04-24 MED ORDER — PANTOPRAZOLE SODIUM 40 MG IV SOLR
40.0000 mg | Freq: Two times a day (BID) | INTRAVENOUS | Status: DC
  Administered 2024-04-24: 40 mg via INTRAVENOUS
  Filled 2024-04-24 (×2): qty 10

## 2024-04-24 MED ORDER — AMIODARONE HCL 200 MG PO TABS
400.0000 mg | ORAL_TABLET | Freq: Two times a day (BID) | ORAL | Status: DC
  Administered 2024-04-24 – 2024-04-29 (×11): 400 mg via ORAL
  Filled 2024-04-24 (×11): qty 2

## 2024-04-24 MED ORDER — LACTATED RINGERS IV SOLN: INTRAVENOUS | Status: DC

## 2024-04-24 MED ORDER — SODIUM CHLORIDE 0.9 % IV BOLUS
500.0000 mL | Freq: Once | INTRAVENOUS | Status: AC
  Administered 2024-04-24: 500 mL via INTRAVENOUS

## 2024-04-24 MED ORDER — POTASSIUM CHLORIDE CRYS ER 20 MEQ PO TBCR
40.0000 meq | EXTENDED_RELEASE_TABLET | Freq: Every day | ORAL | Status: DC
  Administered 2024-04-25: 40 meq via ORAL
  Filled 2024-04-24: qty 2

## 2024-04-24 MED ORDER — PANTOPRAZOLE SODIUM 40 MG IV SOLR
40.0000 mg | Freq: Two times a day (BID) | INTRAVENOUS | Status: DC
  Administered 2024-04-25: 40 mg via INTRAVENOUS
  Filled 2024-04-24: qty 10

## 2024-04-24 MED ORDER — NITROGLYCERIN 0.4 MG SL SUBL: 0.4000 mg | SUBLINGUAL_TABLET | SUBLINGUAL | Status: DC | PRN

## 2024-04-24 NOTE — Plan of Care (Signed)
 Pt BP low, on call provider Cleatus, MD informed.  Received x 1 order for 100ml/hr Normal saline for total of . Pt ambulated to bedside commode. Pt had decreased urine out put. Cleatus, MD aware. Bladder scan max 490, pt voided , amber colored urine. Pt has cpap on, no distress noted.   Problem: Education: Goal: Knowledge of General Education information will improve Description: Including pain rating scale, medication(s)/side effects and non-pharmacologic comfort measures Outcome: Progressing   Problem: Health Behavior/Discharge Planning: Goal: Ability to manage health-related needs will improve Outcome: Progressing   Problem: Clinical Measurements: Goal: Ability to maintain clinical measurements within normal limits will improve Outcome: Progressing Goal: Will remain free from infection Outcome: Progressing Goal: Diagnostic test results will improve Outcome: Progressing Goal: Respiratory complications will improve Outcome: Progressing Goal: Cardiovascular complication will be avoided Outcome: Progressing   Problem: Activity: Goal: Risk for activity intolerance will decrease Outcome: Progressing   Problem: Nutrition: Goal: Adequate nutrition will be maintained Outcome: Progressing   Problem: Coping: Goal: Level of anxiety will decrease Outcome: Progressing   Problem: Elimination: Goal: Will not experience complications related to bowel motility Outcome: Progressing Goal: Will not experience complications related to urinary retention Outcome: Progressing   Problem: Pain Managment: Goal: General experience of comfort will improve and/or be controlled Outcome: Progressing   Problem: Safety: Goal: Ability to remain free from injury will improve Outcome: Progressing   Problem: Skin Integrity: Goal: Risk for impaired skin integrity will decrease Outcome: Progressing   Problem: Education: Goal: Knowledge of condition and prescribed therapy will  improve Outcome: Progressing   Problem: Cardiac: Goal: Will achieve and/or maintain adequate cardiac output Outcome: Progressing   Problem: Physical Regulation: Goal: Complications related to the disease process, condition or treatment will be avoided or minimized Outcome: Progressing   Problem: Activity: Goal: Ability to tolerate increased activity will improve Outcome: Progressing   Problem: Clinical Measurements: Goal: Ability to maintain a body temperature in the normal range will improve Outcome: Progressing   Problem: Respiratory: Goal: Ability to maintain adequate ventilation will improve Outcome: Progressing Goal: Ability to maintain a clear airway will improve Outcome: Progressing

## 2024-04-24 NOTE — Plan of Care (Signed)
  Problem: Education: Goal: Knowledge of General Education information will improve Description: Including pain rating scale, medication(s)/side effects and non-pharmacologic comfort measures Outcome: Progressing   Problem: Health Behavior/Discharge Planning: Goal: Ability to manage health-related needs will improve Outcome: Progressing   Problem: Clinical Measurements: Goal: Ability to maintain clinical measurements within normal limits will improve Outcome: Progressing Goal: Will remain free from infection Outcome: Progressing Goal: Diagnostic test results will improve Outcome: Progressing Goal: Respiratory complications will improve Outcome: Progressing Goal: Cardiovascular complication will be avoided Outcome: Progressing   Problem: Activity: Goal: Risk for activity intolerance will decrease Outcome: Progressing   Problem: Nutrition: Goal: Adequate nutrition will be maintained Outcome: Progressing   Problem: Coping: Goal: Level of anxiety will decrease Outcome: Progressing   Problem: Elimination: Goal: Will not experience complications related to bowel motility Outcome: Progressing Goal: Will not experience complications related to urinary retention Outcome: Progressing   Problem: Pain Managment: Goal: General experience of comfort will improve and/or be controlled Outcome: Progressing   Problem: Safety: Goal: Ability to remain free from injury will improve Outcome: Progressing   Problem: Skin Integrity: Goal: Risk for impaired skin integrity will decrease Outcome: Progressing   Problem: Education: Goal: Knowledge of condition and prescribed therapy will improve Outcome: Progressing   Problem: Cardiac: Goal: Will achieve and/or maintain adequate cardiac output Outcome: Progressing   Problem: Physical Regulation: Goal: Complications related to the disease process, condition or treatment will be avoided or minimized Outcome: Progressing   Problem:  Activity: Goal: Ability to tolerate increased activity will improve Outcome: Progressing   Problem: Clinical Measurements: Goal: Ability to maintain a body temperature in the normal range will improve Outcome: Progressing   Problem: Respiratory: Goal: Ability to maintain adequate ventilation will improve Outcome: Progressing Goal: Ability to maintain a clear airway will improve Outcome: Progressing

## 2024-04-24 NOTE — Progress Notes (Signed)
 Progress Note   Patient: Wendy [name] FMW:[id number] DOB: 1940/10/11 DOA: 04/20/2024     1 DOS: the patient was seen and examined on 04/24/2024   Brief hospital course: 83 y.o. 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  She denies abdominal pain or vomiting she was hypotensive to the 60s with EMS and was placed on Levophed which was discontinued soon after arrival in the ED. On arrival in the ED, BP 83/57 with otherwise normal vitals Labs notable for normal WBC, hemoglobin 11.9 (baseline 12).  CMP notable for potassium of 2.5 with creatinine of 2.02 (baseline 1.5) Lipase and LFTs WNL and magnesium normal Stool culture ordered from the ED Patient treated with IV potassium Admission requested.   11/20.  Patient having diarrhea for 3 weeks.  Stool studies sent off and negative.  EGD negative.  Prepping for colonoscopy and this evening will have a colonoscopy tomorrow 11/21. Colonoscopy negative but biosies taken 11/22.  Patient diagnosed with pneumonia overnight.  Starting rocephin  and zithromax .  Patient went into rapid afib this am.  Coughing up blood.  Placed on oxygen .  11/22 continued.  Needed to switch over to amiodarone  drip to control heart rate with blood pressure being on the lower side.  Will hold further Lasix  and give a fluid bolus.  Consulted pulmonary for coughing up blood.  With starting amiodarone  will have to change Zithromax  over to doxycycline .  With possible aspiration will change Rocephin  over to Zosyn . 11/23.  Patient complained of chest pain this morning.  Given a fluid bolus for low blood pressure.  Troponins trended down from yesterday.  Patient still  having some diarrhea and coughing up blood.  This afternoon down to 5 L nasal cannula.  Assessment and Plan: * Severe sepsis [employer]) Patient with left upper lobe pneumonia, tachycardia, tachypnea leukocytosis and lactic acidosis.  Sputum culture growing moderate gram-positive cocci, blood cultures negative for less than 12 hours, Streptococcus urinary antigen positive.  Currently on Zosyn  and doxycycline   Hemoptysis Suspect from pneumonia  Acute respiratory failure with hypoxia (HCC) Pulse ox 88% on room air.  Patient was on nonrebreather yesterday and switched over to bubble high flow nasal cannula.  Today on 5 L.  Rapid atrial fibrillation (HCC) Converted over to normal sinus rhythm.  Amiodarone  drip started yesterday secondary to hypotension.  Will switch over to oral amiodarone  today.  No blood thinners with hemoptysis.  Hypotension Blood pressure on the lower side today had to give a another fluid bolus.  Acute on chronic diastolic CHF (congestive heart failure) (HCC) Hold Lasix  today and give gentle IV fluid hydration with elevation in creatinine.  Hypokalemia Oral potassium supplementation  Increased anion gap metabolic acidosis Secondary to acute kidney injury on CKD.  Anion gap improved  Acute renal failure superimposed on stage 3b chronic kidney disease (HCC) Secondary to GI losses resulting in hypoperfusion with ATN Metabolic acidosis (improved). Today's creatinine 1.61 with a GFR of 32.  On presentation creatinine 2.02.  Gentle IV fluid hydration.  Diarrhea Stool studies negative.  Colonoscopy appeared normal but biopsies were taken.  Need to rule out microscopic colitis  Recurrent syncope Likely secondary to dehydration, hypotension and hypokalemia  Melena EGD negative.    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  Physical Exam: Vitals:   04/24/24 0611 04/24/24 1204 04/24/24 1347 04/24/24 1411  BP: 118/61 107/62  (!) 121/46  Pulse: 68 71  68  Resp:  14  20  Temp:  97.7 F (36.5 C)  97.7 F (36.5 C)  TempSrc:    Oral  SpO2:  100% 98% 99%  Weight:      Height:       Physical Exam HENT:     Head: Normocephalic.     Mouth/Throat:     Pharynx: No oropharyngeal exudate.  Eyes:     General: Lids are normal.     Conjunctiva/sclera: Conjunctivae normal.  Cardiovascular:     Rate and Rhythm: Normal rate and regular rhythm.     Heart sounds: Normal heart sounds, S1 normal and S2 normal.  Pulmonary:     Effort: No accessory muscle usage.     Breath sounds: Examination of the right-lower field reveals decreased breath sounds and rhonchi. Examination of the left-lower field reveals decreased breath sounds and rhonchi. Decreased breath sounds and rhonchi present. No wheezing or rales.  Abdominal:     Palpations: Abdomen is soft.     Tenderness: There is abdominal tenderness in the epigastric area.  Musculoskeletal:     Right lower leg: Swelling present.     Left lower leg: Swelling present.  Skin:    General: Skin is warm.     Findings: No rash.  Neurological:     Mental Status: She is alert and oriented to person, place, and time.     Data Reviewed: Creatinine 1.61, BUN 33, troponin 38 and 34, white blood cell count 13.9, hemoglobin 10.8, platelet count 218 EKG interpreted by me shows atrial paced.  No acute ST-T wave changes  Family Communication: Spoke with son on the phone  Disposition: Status is:  Inpatient Remains inpatient appropriate because: Switch amiodarone  drip over to p.o. amiodarone .  Continue antibiotics for pneumonia.  Gentle IV fluids for elevation in creatinine.  Planned Discharge Destination: Home    Time spent: 28 minutes  Author: Charlie Patterson, MD 04/24/2024 2:57 PM  For on call review [email or web address]

## 2024-04-25 ENCOUNTER — Encounter: Payer: Self-pay | Admitting: Gastroenterology

## 2024-04-25 DIAGNOSIS — R197 Diarrhea, unspecified: Secondary | ICD-10-CM | POA: Diagnosis not present

## 2024-04-25 DIAGNOSIS — J9601 Acute respiratory failure with hypoxia: Secondary | ICD-10-CM | POA: Diagnosis not present

## 2024-04-25 DIAGNOSIS — R042 Hemoptysis: Secondary | ICD-10-CM | POA: Diagnosis not present

## 2024-04-25 DIAGNOSIS — I959 Hypotension, unspecified: Secondary | ICD-10-CM

## 2024-04-25 DIAGNOSIS — A419 Sepsis, unspecified organism: Secondary | ICD-10-CM | POA: Diagnosis not present

## 2024-04-25 LAB — BASIC METABOLIC PANEL WITH GFR
Anion gap: 8 (ref 5–15)
BUN: 43 mg/dL — ABNORMAL HIGH (ref 8–23)
CO2: 23 mmol/L (ref 22–32)
Calcium: 8.3 mg/dL — ABNORMAL LOW (ref 8.9–10.3)
Chloride: 108 mmol/L (ref 98–111)
Creatinine, Ser: 1.59 mg/dL — ABNORMAL HIGH (ref 0.44–1.00)
GFR, Estimated: 32 mL/min — ABNORMAL LOW (ref >60)
Glucose, Bld: 114 mg/dL — ABNORMAL HIGH (ref 70–99)
Potassium: 5 mmol/L (ref 3.5–5.1)
Sodium: 139 mmol/L (ref 135–145)

## 2024-04-25 LAB — GLUCOSE, CAPILLARY: Glucose-Capillary: 121 mg/dL — ABNORMAL HIGH (ref 70–99)

## 2024-04-25 LAB — CBC
HCT: 31.7 % — ABNORMAL LOW (ref 36.0–46.0)
Hemoglobin: 10.5 g/dL — ABNORMAL LOW (ref 12.0–15.0)
MCH: 32.9 pg (ref 26.0–34.0)
MCHC: 33.1 g/dL (ref 30.0–36.0)
MCV: 99.4 fL (ref 80.0–100.0)
Platelets: 218 K/uL (ref 150–400)
RBC: 3.19 MIL/uL — ABNORMAL LOW (ref 3.87–5.11)
RDW: 13.3 % (ref 11.5–15.5)
WBC: 9.6 K/uL (ref 4.0–10.5)
nRBC: 0 % (ref 0.0–0.2)

## 2024-04-25 LAB — STOOL CULTURE: E coli, Shiga toxin Assay: NEGATIVE

## 2024-04-25 LAB — STOOL CULTURE REFLEX - RSASHR

## 2024-04-25 LAB — STOOL CULTURE REFLEX - CMPCXR

## 2024-04-25 LAB — SURGICAL PATHOLOGY

## 2024-04-25 MED ORDER — CHOLESTYRAMINE LIGHT 4 G PO PACK
4.0000 g | PACK | Freq: Two times a day (BID) | ORAL | Status: DC
  Administered 2024-04-25 – 2024-05-02 (×16): 4 g via ORAL
  Filled 2024-04-25 (×18): qty 1

## 2024-04-25 MED ORDER — SODIUM CHLORIDE 0.9 % IV SOLN
2.0000 g | INTRAVENOUS | Status: AC
  Administered 2024-04-25 – 2024-04-29 (×5): 2 g via INTRAVENOUS
  Filled 2024-04-25 (×5): qty 20

## 2024-04-25 MED ORDER — IPRATROPIUM-ALBUTEROL 0.5-2.5 (3) MG/3ML IN SOLN
3.0000 mL | RESPIRATORY_TRACT | Status: DC
  Administered 2024-04-25: 3 mL via RESPIRATORY_TRACT
  Filled 2024-04-25 (×2): qty 3

## 2024-04-25 MED ORDER — IPRATROPIUM-ALBUTEROL 0.5-2.5 (3) MG/3ML IN SOLN
3.0000 mL | Freq: Three times a day (TID) | RESPIRATORY_TRACT | Status: DC
  Administered 2024-04-26 (×2): 3 mL via RESPIRATORY_TRACT
  Filled 2024-04-25 (×2): qty 3

## 2024-04-25 MED ORDER — METHYLPREDNISOLONE SODIUM SUCC 40 MG IJ SOLR
20.0000 mg | Freq: Two times a day (BID) | INTRAMUSCULAR | Status: DC
  Administered 2024-04-25 – 2024-04-27 (×4): 20 mg via INTRAVENOUS
  Filled 2024-04-25 (×4): qty 1

## 2024-04-25 MED ORDER — SODIUM CHLORIDE 0.9 % IV SOLN: INTRAVENOUS | Status: DC

## 2024-04-25 MED ORDER — PANTOPRAZOLE SODIUM 40 MG PO TBEC
40.0000 mg | DELAYED_RELEASE_TABLET | Freq: Every day | ORAL | Status: DC
  Administered 2024-04-25 – 2024-05-02 (×8): 40 mg via ORAL
  Filled 2024-04-25 (×8): qty 1

## 2024-04-25 MED ORDER — BUDESONIDE 0.5 MG/2ML IN SUSP
0.5000 mg | Freq: Two times a day (BID) | RESPIRATORY_TRACT | Status: DC
  Administered 2024-04-25 – 2024-05-03 (×16): 0.5 mg via RESPIRATORY_TRACT
  Filled 2024-04-25 (×17): qty 2

## 2024-04-25 NOTE — TOC CM/SW Note (Signed)
 Transition of Care [facility]) CM/SW Note    Transition of Care [facility]) - Inpatient Brief Assessment   Patient Details  Name: Wendy [name] MRN: [id number] Date of Birth: [birth year]  Transition of Care [facility]) CM/SW Contact:    Alfonso Rummer, LCSW Phone Number: 04/25/2024, 2:02 PM   Clinical Narrative:  LCSW A. Vernie Piet completed TOC chart review. Recommendation for pt to return home with home health. TOC will follow pt to ensure safe discharge.   Transition of Care Asessment: Insurance and Status: Insurance coverage has been reviewed Patient has primary care physician: Yes ([facility]) Home environment has been reviewed: single family home   Prior/Current Home Services: No current home services Social Drivers of Health Review: SDOH reviewed no interventions necessary Readmission risk has been reviewed: No Transition of care needs: transition of care needs identified, TOC will continue to follow

## 2024-04-25 NOTE — Plan of Care (Signed)
 Pt alert oriented x 4.  Problem: Education: Goal: Knowledge of General Education information will improve Description: Including pain rating scale, medication(s)/side effects and non-pharmacologic comfort measures Outcome: Progressing   Problem: Health Behavior/Discharge Planning: Goal: Ability to manage health-related needs will improve Outcome: Progressing   Problem: Clinical Measurements: Goal: Ability to maintain clinical measurements within normal limits will improve Outcome: Progressing Goal: Will remain free from infection Outcome: Progressing Goal: Diagnostic test results will improve Outcome: Progressing Goal: Respiratory complications will improve Outcome: Progressing Goal: Cardiovascular complication will be avoided Outcome: Progressing   Problem: Activity: Goal: Risk for activity intolerance will decrease Outcome: Progressing   Problem: Nutrition: Goal: Adequate nutrition will be maintained Outcome: Progressing   Problem: Coping: Goal: Level of anxiety will decrease Outcome: Progressing   Problem: Elimination: Goal: Will not experience complications related to bowel motility Outcome: Progressing Goal: Will not experience complications related to urinary retention Outcome: Progressing   Problem: Pain Managment: Goal: General experience of comfort will improve and/or be controlled Outcome: Progressing   Problem: Safety: Goal: Ability to remain free from injury will improve Outcome: Progressing   Problem: Skin Integrity: Goal: Risk for impaired skin integrity will decrease Outcome: Progressing   Problem: Education: Goal: Knowledge of condition and prescribed therapy will improve Outcome: Progressing   Problem: Cardiac: Goal: Will achieve and/or maintain adequate cardiac output Outcome: Progressing   Problem: Physical Regulation: Goal: Complications related to the disease process, condition or treatment will be avoided or minimized Outcome:  Progressing   Problem: Activity: Goal: Ability to tolerate increased activity will improve Outcome: Progressing   Problem: Clinical Measurements: Goal: Ability to maintain a body temperature in the normal range will improve Outcome: Progressing   Problem: Respiratory: Goal: Ability to maintain adequate ventilation will improve Outcome: Progressing Goal: Ability to maintain a clear airway will improve Outcome: Progressing

## 2024-04-25 NOTE — Evaluation (Signed)
 Occupational Therapy Evaluation Patient Details Name: Wendy [name] MRN: [id number] DOB: 07-01-1940 Today's Date: 04/25/2024   History of Present Illness   83 y.o. 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     Clinical Impressions Patient presenting with decreased Ind in self care, balance,functional mobility,transfers, endurance, and safety awareness. Patient reports living at home alone and being Mod I with use of rollator. Pt needing mod A for bed mobility and stands with min A to transfer to [facility]. Pt having BM and needing max A for hygiene and clothing management to doff soiled brief and thread on clean brief. Pt stands with min A and therapist assists with pulling over B hips. Pt taking several steps to recliner chair with min A. Pt is very fatigued. She remains on 5Ls throughout on is not on O2 at baseline.  Patient will benefit from acute OT to increase overall independence in the areas of ADLs, functional mobility, and safety awareness in order to safely discharge.     If plan is discharge home, recommend the following:   A lot of help with walking and/or transfers;A lot of help with bathing/dressing/bathroom;Assistance with cooking/housework;Assist for transportation;Help with stairs or ramp for entrance     Functional Status Assessment   Patient has had a recent decline in their functional status and demonstrates the ability to make significant improvements in function in a reasonable and predictable amount of time.     Equipment Recommendations   Other (comment) (defer to next venue of care)      Precautions/Restrictions   Precautions Precautions: Fall     Mobility Bed Mobility Overal bed mobility: Needs  Assistance Bed Mobility: Sit to Supine       Sit to supine: Mod assist        Transfers Overall transfer level: Needs assistance Equipment used: 1 person hand held assist Transfers: Sit to/from Stand, Bed to chair/wheelchair/BSC Sit to Stand: Min assist     Step pivot transfers: Min assist            Balance Overall balance assessment: Needs assistance Sitting-balance support: Feet supported, Single extremity supported Sitting balance-Leahy Scale: Good     Standing balance support: During functional activity, Single extremity supported Standing balance-Leahy Scale: Fair                             ADL either performed or assessed with clinical judgement   ADL Overall ADL's : Needs assistance/impaired                     Lower Body Dressing: Maximal assistance;Sit to/from stand   Toilet Transfer: Minimal assistance;BSC/3in1   Toileting- Clothing Manipulation and Hygiene: Maximal assistance Toileting - Clothing Manipulation Details (indicate cue type and reason): assistance for clothing management and hygiene             Vision Patient Visual Report: No change from baseline              Pertinent Vitals/Pain Pain Assessment Pain Assessment: No/denies pain     Extremity/Trunk Assessment Upper Extremity Assessment Upper Extremity Assessment: Generalized weakness   Lower Extremity Assessment Lower Extremity Assessment: Generalized weakness       Communication Communication Communication: No apparent difficulties   Cognition Arousal:  Alert Behavior During Therapy: [facility] for tasks assessed/performed Cognition: No apparent impairments                               Following commands: Intact                  Home Living Family/patient expects to be discharged to:: Private residence Living Arrangements: Alone Available Help at Discharge: Family;Available PRN/intermittently Type of Home: House Home Access:  Ramped entrance     Home Layout: One level     Bathroom Shower/Tub: Tub/shower unit;Sponge bathes at baseline         Home Equipment: Rollator (4 wheels);BSC/3in1;Shower seat;Other (comment)   Additional Comments: reacher and toilet aide      Prior Functioning/Environment Prior Level of Function : Independent/Modified Independent;Driving               ADLs Comments: Pt living takes shower 1x/wk but mostly pan baths    OT Problem List: Decreased strength;Decreased activity tolerance;Cardiopulmonary status limiting activity;Impaired balance (sitting and/or standing);Decreased safety awareness   OT Treatment/Interventions: Self-care/ADL training;Therapeutic exercise;Patient/family education;Balance training;Energy conservation;Therapeutic activities      OT Goals(Current goals can be found in the care plan section)   Acute Rehab OT Goals Patient Stated Goal: to get stronger OT Goal Formulation: With patient Time For Goal Achievement: 05/09/24 Potential to Achieve Goals: Fair ADL Goals Pt Will Perform Grooming: standing;with supervision Pt Will Perform Lower Body Dressing: sit to/from stand;with supervision Pt Will Transfer to Toilet: ambulating;with supervision Pt Will Perform Toileting - Clothing Manipulation and hygiene: sit to/from stand;with supervision   OT Frequency:  Min 2X/week       AM-PAC OT 6 Clicks Daily Activity     Outcome Measure Help from another person eating meals?: None Help from another person taking care of personal grooming?: A Little Help from another person toileting, which includes using toliet, bedpan, or urinal?: A Lot Help from another person bathing (including washing, rinsing, drying)?: A Lot Help from another person to put on and taking off regular upper body clothing?: A Little Help from another person to put on and taking off regular lower body clothing?: A Lot 6 Click Score: 16   End of Session Equipment Utilized During  Treatment: Oxygen  (5Ls)  Activity Tolerance: Patient limited by fatigue Patient left: in chair;with call bell/phone within reach;with chair alarm set  OT Visit Diagnosis: Unsteadiness on feet (R26.81);Repeated falls (R29.6);Muscle weakness (generalized) (M62.81)                Time: 9057-8991 OT Time Calculation (min): 26 min Charges:  OT General Charges $OT Visit: 1 Visit OT Evaluation $OT Eval Moderate Complexity: 1 Mod OT Treatments $Self Care/Home Management : 8-22 mins  Izetta Claude, MS, OTR/L , CBIS ascom [phone]  04/25/24, 2:21 PM

## 2024-04-25 NOTE — Progress Notes (Signed)
 Progress Note   Patient: Wendy [name] FMW:[id number] DOB: 03/18/1941 DOA: 04/20/2024     2 DOS: the patient was seen and examined on 04/25/2024   Brief hospital course: 83 y.o. 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  She denies abdominal pain or vomiting she was hypotensive to the 60s with EMS and was placed on Levophed which was discontinued soon after arrival in the ED. On arrival in the ED, BP 83/57 with otherwise normal vitals Labs notable for normal WBC, hemoglobin 11.9 (baseline 12).  CMP notable for potassium of 2.5 with creatinine of 2.02 (baseline 1.5) Lipase and LFTs WNL and magnesium normal Stool culture ordered from the ED Patient treated with IV potassium Admission requested.   11/20.  Patient having diarrhea for 3 weeks.  Stool studies sent off and negative.  EGD negative.  Prepping for colonoscopy and this evening will have a colonoscopy tomorrow 11/21. Colonoscopy negative but biosies taken 11/22.  Patient diagnosed with pneumonia overnight.  Starting rocephin  and zithromax .  Patient went into rapid afib this am.  Coughing up blood.  Placed on oxygen .  11/22 continued.  Needed to switch over to amiodarone  drip to control heart rate with blood pressure being on the lower side.  Will hold further Lasix  and give a fluid bolus.  Consulted pulmonary for coughing up blood.  With starting amiodarone  will have to change Zithromax  over to doxycycline .  With possible aspiration will change Rocephin  over to Zosyn . 11/23.  Patient complained of chest pain this morning.  Given a fluid bolus for low blood pressure.  Troponins trended down from yesterday.  Patient still  having some diarrhea and coughing up blood.  This afternoon down to 5 L nasal cannula.  Assessment and Plan: * Severe sepsis [facility]) Patient with left upper lobe pneumonia, tachycardia, tachypnea leukocytosis and lactic acidosis.  Sputum culture growing moderate gram-positive cocci, blood cultures negative for less than 12 hours, Streptococcus urinary antigen positive Streptococcus pneumonia growing out of sputum culture.  Currently on Zosyn  and doxycycline .  Will switch Zosyn  over to Rocephin  and continue doxycycline  currently.  Hemoptysis Suspect from pneumonia  Acute respiratory failure with hypoxia (HCC) Pulse ox 88% on room air.  Patient was on nonrebreather when diagnosed with pneumonia and switched over to bubble high flow nasal cannula.  Today on 5 L this morning.  Pulse ox good so we have room to taper down.  Rapid atrial fibrillation (HCC) Converted over to normal sinus rhythm.  Amiodarone  drip started on 11/22 and switched over to oral amiodarone  on 11/23.  No blood thinners secondary to hemoptysis  Hypotension Blood pressure improved  Acute renal failure superimposed on stage 3b chronic kidney disease (HCC) Secondary to GI losses resulting in hypoperfusion with ATN Metabolic acidosis (improved). Today's creatinine 1.59 with a GFR of 32.  On presentation creatinine 2.02.  Creatinine as low as 1.3 during hospital course.  Gentle IV fluid hydration.  Acute on chronic diastolic CHF (congestive heart failure) (HCC) Continue to hold Lasix .  Watch closely with gentle IV fluid hydration  Hypokalemia Replaced  Increased anion gap metabolic acidosis Secondary to acute kidney injury on CKD.  Anion gap improved  Diarrhea Stool studies negative.  Colonoscopy appeared normal but biopsies were taken.  Need to rule out microscopic colitis.  Start cholestyramine  for diarrhea.  Recurrent syncope Likely secondary to dehydration, hypotension and hypokalemia  Melena EGD negative.    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  Still coughing up blood.  Still having some diarrhea.  Admitted with black stool and diarrhea then diagnosed with pneumonia and sepsis.  Physical Exam: Vitals:   04/25/24 0445 04/25/24 0812 04/25/24 1149 04/25/24 1643  BP:  (!) 131/59 (!) 132/41 (!) 151/70  Pulse:  68 69 70  Resp:  16 14 16   Temp:  (!) 97.5 F (36.4 C) 97.7 F (36.5 C) 97.9 F (36.6 C)  TempSrc:      SpO2:  100% 100% 99%  Weight: 106 kg     Height:       Physical Exam HENT:     Head: Normocephalic.     Mouth/Throat:     Pharynx: No oropharyngeal exudate.  Eyes:     General: Lids are normal.     Conjunctiva/sclera: Conjunctivae normal.  Cardiovascular:     Rate and Rhythm: Normal rate and regular rhythm.     Heart sounds: Normal heart sounds, S1 normal and S2 normal.  Pulmonary:     Effort: No accessory muscle usage.     Breath sounds: Examination of the right-lower field reveals decreased breath sounds and rhonchi. Examination of the left-lower field reveals decreased breath sounds and rhonchi. Decreased breath sounds and rhonchi present. No wheezing or rales.  Abdominal:     Palpations: Abdomen is soft.     Tenderness: There is abdominal tenderness in the epigastric area.  Musculoskeletal:     Right lower leg: Swelling present.     Left lower leg: Swelling present.  Skin:    General: Skin is warm.     Findings: No rash.  Neurological:     Mental Status: She is alert and oriented to person, place, and time.     Data Reviewed: Sodium 139,  potassium 5.0, creatinine 1.59, BUN 43, GFR 32, white blood cell count 9.6, hemoglobin 10.5, platelet count 218  Family Communication: Updated son on the phone  Disposition: Status is: Inpatient Remains inpatient appropriate because: Continue treatment for pneumonia with IV antibiotics and IV steroids.  Planned Discharge Destination: Physical therapy now recommending rehab    Time spent: 28 minutes Case discussed with pulmonary  Author: Charlie Patterson, MD 04/25/2024 5:39 PM  For on call review [email or web address].

## 2024-04-25 NOTE — Progress Notes (Signed)
 PIV consult: Pt with limited options for venous access. RUE restricted (hx of breast Ca). L forearm edematous. PIV in L upper arm. Recommend temporary central line for prolonged infusions. Not a PICC candidate due to CKD. Please flush PIV with 10mL normal saline between incompatible infusions.

## 2024-04-25 NOTE — Care Management Important Message (Signed)
 Important Message  Patient Details  Name: Wendy [name] MRN: [id number] Date of Birth: 1941-06-01   Important Message Given:  Yes - Medicare IM     Rojelio [name] 04/25/2024, 4:29 PM

## 2024-04-25 NOTE — Evaluation (Signed)
 Physical Therapy Evaluation Patient Details Name: Wendy [name] MRN: [id number] DOB: Nov 02, 1940 Today's Date: 04/25/2024  History of Present Illness  83 y.o. 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  Clinical Impression  Pt is a very pleasant 83 y.o. female admitted d/t severe sepsis. Pt received in recliner with family at bedside. Orthostatics taken-- negative and no concern for dizziness. Pt expressed concern because the values seemed higher than normal. SPT provided pt with edu on placement/location of BP cuff and how that may effect readings. Pt reports using a rollator at baseline and provided with edu on safe hand placement for RW transfers, which she was able to recall very well. She was able to complete 5xSTS, with the first 3 reps using the RW and the last 2 reps without AD or HHA, CGA throughout. Pt provided with edu on PLB throughout treatment. She was left in recliner per request with all belongings within reach and family in room. She will continue to benefit from skilled physical therapy services to address her decline in functional strength, mobility, and activity tolerance. Plan to ambulate next session.       If plan is discharge home, recommend the following: A little help with walking and/or transfers   Can travel by private vehicle   Yes    Equipment Recommendations Rolling walker (2 wheels)  Recommendations for Other Services       Functional Status Assessment Patient has had a recent decline in their functional status and demonstrates the ability to make significant improvements in function in a reasonable and predictable amount of time.     Precautions / Restrictions Precautions Precautions: Fall Recall of  Precautions/Restrictions: Intact Restrictions Weight Bearing Restrictions Per Provider Order: No      Mobility  Bed Mobility               General bed mobility comments: NT - received in recliner    Transfers Overall transfer level: Needs assistance Equipment used: Rolling walker (2 wheels) Transfers: Sit to/from Stand Sit to Stand: Contact guard assist           General transfer comment: completed 5 STS with CGA; last 2 without RW    Ambulation/Gait               General Gait Details: NT - standing activites performed  Stairs            Wheelchair Mobility     Tilt Bed    Modified Rankin (Stroke Patients Only)       Balance Overall balance assessment: Needs assistance Sitting-balance support: No upper extremity supported, Feet supported Sitting balance-Leahy Scale: Good     Standing balance support: No upper extremity supported, During functional activity Standing balance-Leahy Scale: Fair Standing balance comment: used RW for STS x3, but able to perform STS without AD or HHA x2. CGA throughout but not reliant on AD for balance                             Pertinent Vitals/Pain Pain Assessment Pain Assessment: No/denies pain    Home Living Family/patient expects to be discharged to:: Private residence Living Arrangements: Alone Available Help at Discharge: Family;Available PRN/intermittently Type of Home: House Home Access: Ramped entrance  Home Layout: One level Home Equipment: Rollator (4 wheels);BSC/3in1;Shower seat;Other (comment) Additional Comments: reacher and toilet aide    Prior Function Prior Level of Function : Independent/Modified Independent;Driving             Mobility Comments: rollator at baseline ADLs Comments: Pt living takes shower 1x/wk but mostly pan baths     Extremity/Trunk Assessment   Upper Extremity Assessment Upper Extremity Assessment: Overall WFL for tasks assessed     Lower Extremity Assessment Lower Extremity Assessment: Overall WFL for tasks assessed    Cervical / Trunk Assessment Cervical / Trunk Assessment: Normal  Communication   Communication Communication: No apparent difficulties    Cognition Arousal: Alert Behavior During Therapy: WFL for tasks assessed/performed   PT - Cognitive impairments: No apparent impairments                         Following commands: Intact       Cueing Cueing Techniques: Verbal cues, Gestural cues     General Comments      Exercises Other Exercises Other Exercises: STS from recliner x5; first 3x to RW, last 2 no AD or HHA; CGA thoruhgout for steadiness but no LOB or concern for dizziness.   Assessment/Plan    PT Assessment Patient needs continued PT services  PT Problem List Decreased strength;Decreased activity tolerance;Decreased balance;Decreased mobility       PT Treatment Interventions Gait training;Functional mobility training;Balance training;Therapeutic exercise;Therapeutic activities    PT Goals (Current goals can be found in the Care Plan section)  Acute Rehab PT Goals Patient Stated Goal: to return home PT Goal Formulation: With patient Time For Goal Achievement: 05/09/24 Potential to Achieve Goals: Good    Frequency Min 2X/week     Co-evaluation               AM-PAC PT 6 Clicks Mobility  Outcome Measure Help needed turning from your back to your side while in a flat bed without using bedrails?: A Little Help needed moving from lying on your back to sitting on the side of a flat bed without using bedrails?: A Little Help needed moving to and from a bed to a chair (including a wheelchair)?: A Little Help needed standing up from a chair using your arms (e.g., wheelchair or bedside chair)?: A Little Help needed to walk in hospital room?: A Little Help needed climbing 3-5 steps with a railing? : A Lot 6 Click Score: 17    End of Session Equipment  Utilized During Treatment: Gait belt;Oxygen  Activity Tolerance: Patient tolerated treatment well Patient left: in chair;with call bell/phone within reach;with chair alarm set;with family/visitor present Nurse Communication: Mobility status PT Visit Diagnosis: Difficulty in walking, not elsewhere classified (R26.2);Other abnormalities of gait and mobility (R26.89);Unsteadiness on feet (R26.81)    Time: 1410-1440 PT Time Calculation (min) (ACUTE ONLY): 30 min   Charges:   PT Evaluation $PT Eval Low Complexity: 1 Low   PT General Charges $$ ACUTE PT VISIT: 1 Visit         Allena Bulls, SPT   Allena Bulls 04/25/2024, 4:00 PM

## 2024-04-26 DIAGNOSIS — R042 Hemoptysis: Secondary | ICD-10-CM | POA: Diagnosis not present

## 2024-04-26 DIAGNOSIS — R197 Diarrhea, unspecified: Secondary | ICD-10-CM | POA: Diagnosis not present

## 2024-04-26 DIAGNOSIS — I10 Essential (primary) hypertension: Secondary | ICD-10-CM

## 2024-04-26 DIAGNOSIS — J9601 Acute respiratory failure with hypoxia: Secondary | ICD-10-CM | POA: Diagnosis not present

## 2024-04-26 DIAGNOSIS — A419 Sepsis, unspecified organism: Secondary | ICD-10-CM | POA: Diagnosis not present

## 2024-04-26 LAB — CULTURE, RESPIRATORY W GRAM STAIN

## 2024-04-26 LAB — LEGIONELLA PNEUMOPHILA SEROGP 1 UR AG
L. pneumophila Serogp 1 Ur Ag: NEGATIVE
L. pneumophila Serogp 1 Ur Ag: NEGATIVE

## 2024-04-26 LAB — GLUCOSE, CAPILLARY: Glucose-Capillary: 109 mg/dL — ABNORMAL HIGH (ref 70–99)

## 2024-04-26 MED ORDER — IPRATROPIUM-ALBUTEROL 0.5-2.5 (3) MG/3ML IN SOLN
3.0000 mL | Freq: Two times a day (BID) | RESPIRATORY_TRACT | Status: DC
  Administered 2024-04-27: 3 mL via RESPIRATORY_TRACT
  Filled 2024-04-26: qty 3

## 2024-04-26 MED ORDER — AMLODIPINE BESYLATE 5 MG PO TABS
5.0000 mg | ORAL_TABLET | Freq: Every day | ORAL | Status: DC
  Administered 2024-04-26 – 2024-04-30 (×5): 5 mg via ORAL
  Filled 2024-04-26 (×5): qty 1

## 2024-04-26 MED ORDER — ALLOPURINOL 100 MG PO TABS
50.0000 mg | ORAL_TABLET | Freq: Every day | ORAL | Status: DC
  Administered 2024-04-27: 50 mg via ORAL
  Filled 2024-04-26: qty 1

## 2024-04-26 MED ORDER — LOPERAMIDE HCL 2 MG PO CAPS
2.0000 mg | ORAL_CAPSULE | Freq: Three times a day (TID) | ORAL | Status: DC | PRN
  Administered 2024-04-26: 2 mg via ORAL
  Filled 2024-04-26: qty 1

## 2024-04-26 NOTE — Progress Notes (Signed)
 Occupational Therapy Treatment Patient Details Name: Wendy [name] MRN: [id number] DOB: 23-Jul-1940 Today's Date: 04/26/2024   History of present illness 83 y.o. 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   OT comments  Pt seen for OT treatment on this date. Upon arrival to room pt seated in recliner with friend at bedside, agreeable to tx. Pt requires CGA for STS from recliner, CGA with use of RW for transfers including toileting and ambulation within room and hallway ~23ft. On 3L oxygen  via [place]  Spo2 levels >94% pre/post mobility. Pt eager to progress functional mobility/activity tolerance for carryover for ADL/IADL completion. Pt making good progress toward goals, will continue to follow POC. Discharge recommendation remains appropriate.        If plan is discharge home, recommend the following:  A lot of help with walking and/or transfers;A lot of help with bathing/dressing/bathroom;Assistance with cooking/housework;Assist for transportation;Help with stairs or ramp for entrance   Equipment Recommendations  Other (comment)    Recommendations for Other Services      Precautions / Restrictions Precautions Precautions: Fall Recall of Precautions/Restrictions: Intact Restrictions Weight Bearing Restrictions Per Provider Order: No       Mobility Bed Mobility               General bed mobility comments: NT pt in recliner pre/post session    Transfers Overall transfer level: Needs assistance Equipment used: Rolling walker (2 wheels) Transfers: Sit to/from Stand Sit to Stand: Contact guard assist           General transfer comment: Ambulated ~74ft with use of RW, attempted initially without AD, pt reaching for support. Increased  confidence with use of RW.     Balance Overall balance assessment: Needs assistance Sitting-balance support: No upper extremity supported, Feet supported Sitting balance-Leahy Scale: Good     Standing balance support: During functional activity, Bilateral upper extremity supported Standing balance-Leahy Scale: Fair Standing balance comment: Improved with RW                           ADL either performed or assessed with clinical judgement   ADL Overall ADL's : Needs assistance/impaired Eating/Feeding: Set up;Sitting   Grooming: Wash/dry hands;Set up;Sitting                   Toilet Transfer: Contact guard assist;Ambulation;Rolling walker (2 wheels)   Toileting- Clothing Manipulation and Hygiene: Moderate assistance;Sit to/from stand       Functional mobility during ADLs: Contact guard assist;Rolling walker (2 wheels);Cueing for safety General ADL Comments: verbal cues for safety awareness and DME use, no physical support required     Communication Communication Communication: No apparent difficulties   Cognition Arousal: Alert Behavior During Therapy: WFL for tasks assessed/performed Cognition: No apparent impairments             OT - Cognition Comments: A/Ox4                 Following commands: Intact        Cueing   Cueing Techniques: Verbal cues, Gestural cues  Exercises Exercises: Other exercises Other Exercises Other Exercises: Edu: Role of OT treatment session, fall prevention technique, ECT, DME management    Shoulder Instructions       General Comments On  3L oxygen  via [place]  Spo2 levels >94% pre/post mobility    Pertinent Vitals/ Pain       Pain Assessment Pain Assessment: 0-10 Pain Score: 6  Pain Location: Abdomen Pain Descriptors / Indicators: Aching Pain Intervention(s): Limited activity within patient's tolerance, Monitored during session                                                           Frequency  Min 2X/week        Progress Toward Goals  OT Goals(current goals can now be found in the care plan section)  Progress towards OT goals: Progressing toward goals  Acute Rehab OT Goals OT Goal Formulation: With patient Time For Goal Achievement: 05/09/24 Potential to Achieve Goals: Fair ADL Goals Pt Will Perform Grooming: standing;with supervision Pt Will Perform Lower Body Dressing: sit to/from stand;with supervision Pt Will Transfer to Toilet: ambulating;with supervision Pt Will Perform Toileting - Clothing Manipulation and hygiene: sit to/from stand;with supervision  Plan      Co-evaluation                 AM-PAC OT 6 Clicks Daily Activity     Outcome Measure   Help from another person eating meals?: None Help from another person taking care of personal grooming?: A Little Help from another person toileting, which includes using toliet, bedpan, or urinal?: A Little Help from another person bathing (including washing, rinsing, drying)?: A Lot Help from another person to put on and taking off regular upper body clothing?: A Little Help from another person to put on and taking off regular lower body clothing?: A Little 6 Click Score: 18    End of Session Equipment Utilized During Treatment: Oxygen ;Rolling walker (2 wheels);Gait belt  OT Visit Diagnosis: Unsteadiness on feet (R26.81);Repeated falls (R29.6);Muscle weakness (generalized) (M62.81)   Activity Tolerance Patient tolerated treatment well   Patient Left in chair;with call bell/phone within reach;with chair alarm set;with family/visitor present   Nurse Communication Mobility status        Time: 8751-8677 OT Time Calculation (min): 34 min  Charges: OT General Charges $OT Visit: 1 Visit OT Treatments $Self Care/Home Management : 8-22 mins $Therapeutic Activity: 8-22 mins  Larraine Colas M.S. OTR/L  04/26/24, 1:40 PM

## 2024-04-26 NOTE — Assessment & Plan Note (Signed)
 Blood pressure elevated on 11/25 will start Norvasc .

## 2024-04-26 NOTE — TOC Progression Note (Signed)
 Transition of Care [facility]) - Progression Note    Patient Details  Name: Wendy [name] MRN: [id number] Date of Birth: [birth year]  [facility] CM/SW Contact  Alfonso Rummer, LCSW Phone Number: 04/26/2024, 2:15 PM  Clinical Narrative:    LCSW A. Thia Olesen spk with patient at bedside in room 236 to informed of recommendations. Pt was alert and oriented during toc visit. Pt advised of recommendations and declines to transition to short term rehab at snf and declines home health assistance. Pt reports she has adequate support at home and family will pick up when she is medically ready for discharge.                      Expected Discharge Plan and Services                                               Social Drivers of Health (SDOH) Interventions SDOH Screenings   Food Insecurity: No Food Insecurity (04/21/2024)  Housing: Low Risk  (04/21/2024)  Transportation Needs: No Transportation Needs (04/21/2024)  Utilities: Not At Risk (04/21/2024)  Financial Resource Strain: Low Risk  (07/28/2023)   Received from [facility]  Social Connections: Socially Isolated (04/21/2024)  Tobacco Use: Low Risk  (04/22/2024)    Readmission Risk Interventions     No data to display

## 2024-04-26 NOTE — Progress Notes (Signed)
 Progress Note   Patient: Wendy [name] FMW:[id number] DOB: 02-Oct-1940 DOA: 04/20/2024     3 DOS: the patient was seen and examined on 04/26/2024   Brief hospital course: 83 y.o. 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  She denies abdominal pain or vomiting she was hypotensive to the 60s with EMS and was placed on Levophed which was discontinued soon after arrival in the ED. On arrival in the ED, BP 83/57 with otherwise normal vitals Labs notable for normal WBC, hemoglobin 11.9 (baseline 12).  CMP notable for potassium of 2.5 with creatinine of 2.02 (baseline 1.5) Lipase and LFTs WNL and magnesium normal Stool culture ordered from the ED Patient treated with IV potassium Admission requested.   11/20.  Patient having diarrhea for 3 weeks.  Stool studies sent off and negative.  EGD negative.  Prepping for colonoscopy and this evening will have a colonoscopy tomorrow 11/21. Colonoscopy negative but biosies taken 11/22.  Patient diagnosed with pneumonia overnight.  Starting rocephin  and zithromax .  Patient went into rapid afib this am.  Coughing up blood.  Placed on oxygen .  11/22 continued.  Needed to switch over to amiodarone  drip to control heart rate with blood pressure being on the lower side.  Will hold further Lasix  and give a fluid bolus.  Consulted pulmonary for coughing up blood.  With starting amiodarone  will have to change Zithromax  over to doxycycline .  With possible aspiration will change Rocephin  over to Zosyn .  Steroids started by pulmonary.  11/23.  Patient complained of chest pain this morning.  Given a fluid bolus for low blood pressure.  Troponins trended down  from yesterday.  Patient still having some diarrhea and coughing up blood.  This afternoon down to 5 L nasal cannula. 11/24.  Patient still complaining of coughing up blood.  Still having some rib pains.  Still having some diarrhea. 11/25.  Patient still coughing up a little blood still having some diarrhea.  Colon biopsies did not show any pathology.  Supportive care with cholestyramine  and as needed Imodium .  Can consider Xifaxan.  Switch antibiotics for pneumonia to Rocephin  only since Streptococcus pneumonia growing out of sputum culture.  Physical therapy recommending rehab.  Assessment and Plan: * Severe sepsis [facility]) Patient with left upper lobe pneumonia, tachycardia, tachypnea leukocytosis and lactic acidosis.  Sputum culture growing Streptococcus pneumoniae sensitive to Rocephin . Streptococcus urinary antigen positive.  Blood cultures negative for 3 days.  Patient on Rocephin  will get rid of doxycycline .  Hemoptysis Suspect from pneumonia.  Pulmonary thinks this will improve with treatment of pneumonia  Acute respiratory failure with hypoxia (HCC) Pulse ox 88% on room air.  Patient was on nonrebreather when diagnosed with pneumonia and switched over to bubble high flow nasal cannula.  Yesterday on 5 L.  This afternoon able to come down to 2 L.  On steroids.  Rapid atrial fibrillation (HCC) Converted over to normal sinus rhythm.  Amiodarone  drip started on 11/22 and switched over to oral amiodarone  on 11/23.  No blood thinners secondary to hemoptysis  Hypotension Blood pressure improved and now has high blood pressure  Acute renal failure superimposed on stage 3b chronic kidney disease (HCC) Secondary to GI losses resulting in hypoperfusion with ATN Metabolic acidosis (  improved). Last creatinine 1.59 with a GFR of 32.  On presentation creatinine 2.02.  Creatinine as low as 1.3 during hospital course.  Patient with difficult IV access will get rid of IV fluid.  Encouraged to  drink.  Acute on chronic diastolic CHF (congestive heart failure) (HCC) Continue to hold Lasix .    Hypokalemia Replaced  Increased anion gap metabolic acidosis Secondary to acute kidney injury on CKD.  Anion gap improved  Diarrhea Stool studies negative.  Colonoscopy negative and biopsies do not show any etiology.  Continue cholestyramine  for diarrhea.  As needed Imodium .  Decrease dose of allopurinol  down to 50 mg daily because this could be a culprit of diarrhea.  Patient also has a history of colon resection and short colon could cause diarrhea also.  Recurrent syncope Likely secondary to dehydration, hypotension and hypokalemia  Melena EGD negative.    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  Hypothyroidism On levothyroxine   Obstructive sleep apnea CPAP nightly if desired        Subjective: Patient states she is still coughing up blood and still having diarrhea.  Asked nursing staff to document the diarrhea.  Patient breathing a little bit better than a few days ago.  Physical Exam: Vitals:   04/26/24 0324 04/26/24 0325 04/26/24 0755 04/26/24 1250  BP: (!) 140/72  (!) 187/84 (!) 155/62  Pulse: 69  71 70  Resp: 19  18 18   Temp: (!) 97.3 F (36.3 C)  97.9 F (36.6 C) 98.2 F (36.8 C)  TempSrc:      SpO2: 97%  100% 100%  Weight:  109.4 kg    Height:       Physical Exam HENT:     Head: Normocephalic.     Mouth/Throat:     Pharynx: No oropharyngeal exudate.  Eyes:     General: Lids are normal.     Conjunctiva/sclera: Conjunctivae normal.  Cardiovascular:     Rate and Rhythm: Normal rate and regular rhythm.      Heart sounds: Normal heart sounds, S1 normal and S2 normal.  Pulmonary:     Effort: No accessory muscle usage.     Breath sounds: Examination of the right-lower field reveals decreased breath sounds. Examination of the left-lower field reveals decreased breath sounds. Decreased breath sounds present. No wheezing, rhonchi or rales.  Abdominal:     Palpations: Abdomen is soft.     Tenderness: There is abdominal tenderness in the epigastric area.  Musculoskeletal:     Right lower leg: Swelling present.     Left lower leg: Swelling present.  Skin:    General: Skin is warm.     Findings: No rash.  Neurological:     Mental Status: She is alert and oriented to person, place, and time.     Data Reviewed: Last creatinine 1.59 last hemoglobin 10.5 Streptococcus pneumoniae growing out of sputum culture sensitive to Rocephin .  Family Communication: Left message for son  Disposition: Status is: Inpatient Remains inpatient appropriate because: TOC to look into rehabs  Planned Discharge Destination: Rehab    Time spent: 28 minutes  Author: Charlie Patterson, MD 04/26/2024 3:09 PM  For on call review [email or web address].

## 2024-04-27 DIAGNOSIS — A419 Sepsis, unspecified organism: Secondary | ICD-10-CM | POA: Diagnosis not present

## 2024-04-27 DIAGNOSIS — R197 Diarrhea, unspecified: Secondary | ICD-10-CM | POA: Diagnosis not present

## 2024-04-27 DIAGNOSIS — R6520 Severe sepsis without septic shock: Secondary | ICD-10-CM | POA: Diagnosis not present

## 2024-04-27 LAB — COMPREHENSIVE METABOLIC PANEL WITH GFR
ALT: 53 U/L — ABNORMAL HIGH (ref 0–44)
AST: 35 U/L (ref 15–41)
Albumin: 3.3 g/dL — ABNORMAL LOW (ref 3.5–5.0)
Alkaline Phosphatase: 132 U/L — ABNORMAL HIGH (ref 38–126)
Anion gap: 12 (ref 5–15)
BUN: 35 mg/dL — ABNORMAL HIGH (ref 8–23)
CO2: 20 mmol/L — ABNORMAL LOW (ref 22–32)
Calcium: 9.2 mg/dL (ref 8.9–10.3)
Chloride: 110 mmol/L (ref 98–111)
Creatinine, Ser: 1.24 mg/dL — ABNORMAL HIGH (ref 0.44–1.00)
GFR, Estimated: 44 mL/min — ABNORMAL LOW (ref >60)
Glucose, Bld: 143 mg/dL — ABNORMAL HIGH (ref 70–99)
Potassium: 5 mmol/L (ref 3.5–5.1)
Sodium: 142 mmol/L (ref 135–145)
Total Bilirubin: 0.4 mg/dL (ref 0.0–1.2)
Total Protein: 6.6 g/dL (ref 6.5–8.1)

## 2024-04-27 LAB — CBC WITH DIFFERENTIAL/PLATELET
Abs Immature Granulocytes: 0.71 K/uL — ABNORMAL HIGH (ref 0.00–0.07)
Basophils Absolute: 0.1 K/uL (ref 0.0–0.1)
Basophils Relative: 0 %
Eosinophils Absolute: 0 K/uL (ref 0.0–0.5)
Eosinophils Relative: 0 %
HCT: 39.6 % (ref 36.0–46.0)
Hemoglobin: 12.7 g/dL (ref 12.0–15.0)
Immature Granulocytes: 6 %
Lymphocytes Relative: 10 %
Lymphs Abs: 1.2 K/uL (ref 0.7–4.0)
MCH: 31.8 pg (ref 26.0–34.0)
MCHC: 32.1 g/dL (ref 30.0–36.0)
MCV: 99.2 fL (ref 80.0–100.0)
Monocytes Absolute: 0.4 K/uL (ref 0.1–1.0)
Monocytes Relative: 4 %
Neutro Abs: 9.3 K/uL — ABNORMAL HIGH (ref 1.7–7.7)
Neutrophils Relative %: 80 %
Platelets: 321 K/uL (ref 150–400)
RBC: 3.99 MIL/uL (ref 3.87–5.11)
RDW: 13.9 % (ref 11.5–15.5)
Smear Review: NORMAL
WBC: 11.7 K/uL — ABNORMAL HIGH (ref 4.0–10.5)
nRBC: 0 % (ref 0.0–0.2)

## 2024-04-27 LAB — GLUCOSE, CAPILLARY: Glucose-Capillary: 111 mg/dL — ABNORMAL HIGH (ref 70–99)

## 2024-04-27 MED ORDER — LABETALOL HCL 5 MG/ML IV SOLN: 10.0000 mg | INTRAVENOUS | Status: DC | PRN

## 2024-04-27 MED ORDER — HYDRALAZINE HCL 20 MG/ML IJ SOLN
10.0000 mg | Freq: Four times a day (QID) | INTRAMUSCULAR | Status: DC | PRN
  Administered 2024-04-30: 10 mg via INTRAVENOUS
  Filled 2024-04-27: qty 1

## 2024-04-27 MED ORDER — METHYLPREDNISOLONE SODIUM SUCC 40 MG IJ SOLR
20.0000 mg | Freq: Every day | INTRAMUSCULAR | Status: DC
  Administered 2024-04-28 – 2024-04-29 (×2): 20 mg via INTRAVENOUS
  Filled 2024-04-27 (×2): qty 1

## 2024-04-27 MED ORDER — LOPERAMIDE HCL 2 MG PO CAPS
2.0000 mg | ORAL_CAPSULE | Freq: Three times a day (TID) | ORAL | Status: DC
  Administered 2024-04-27 – 2024-05-02 (×15): 2 mg via ORAL
  Filled 2024-04-27 (×15): qty 1

## 2024-04-27 NOTE — Progress Notes (Signed)
 Physical Therapy Treatment Patient Details Name: Wendy [name] MRN: [id number] DOB: 03-10-1941 Today's Date: 04/27/2024   History of Present Illness 83 y.o. 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    PT Comments  Pt seen for PT tx with pt agreeable. Pt is able to complete transfers with supervision<>light CGA, ambulate in room & bathroom with RW & close supervision<>CGA, cuing re: positioning when standing at sink, bring walker back prior to stand>sit. Pt is making steady progress with mobility, continues to be limited by breathing difficulties but SpO2 >/= 90% on 4L/min via nasal cannula. Continue to recommend post acute rehab <3 hours therapy/day upon d/c as pt is unsafe to d/c home alone.   If plan is discharge home, recommend the following: A little help with walking and/or transfers;A little help with bathing/dressing/bathroom;Assistance with cooking/housework;Assist for transportation;Help with stairs or ramp for entrance   Can travel by private vehicle     Yes  Equipment Recommendations  Rolling walker (2 wheels);BSC/3in1    Recommendations for Other Services       Precautions / Restrictions Precautions Precautions: Fall Recall of Precautions/Restrictions: Intact Restrictions Weight Bearing Restrictions Per Provider Order: No     Mobility  Bed Mobility               General bed mobility comments: not tested, pt received & left sitting in recliner    Transfers Overall transfer level: Needs assistance Equipment used: Rolling walker (2 wheels) Transfers: Sit to/from Stand Sit to Stand: Supervision, Contact guard assist           General transfer comment: sit<>stand from recliner with supervision, sit<>stand from  low toilet with grab bar & CGA    Ambulation/Gait Ambulation/Gait assistance: Supervision, Contact guard assist Gait Distance (Feet): 18 Feet (+ 20 ft) + 20 ft Assistive device: Rolling walker (2 wheels) Gait Pattern/deviations: Decreased step length - left, Decreased step length - right, Decreased stride length Gait velocity: decreased         Stairs             Wheelchair Mobility     Tilt Bed    Modified Rankin (Stroke Patients Only)       Balance Overall balance assessment: Needs assistance Sitting-balance support: No upper extremity supported, Feet supported Sitting balance-Leahy Scale: Good     Standing balance support: During functional activity, No upper extremity supported Standing balance-Leahy Scale: Poor Standing balance comment: CGA static standing at sink for hand hygiene, uses BUE to correct posterior bias                            Communication Communication Communication: No apparent difficulties  Cognition Arousal: Alert Behavior During Therapy: WFL for tasks assessed/performed   PT - Cognitive impairments: No apparent impairments                         Following commands: Intact      Cueing Cueing Techniques: Verbal cues, Gestural cues  Exercises      General Comments General comments (skin integrity, edema, etc.): Pt on 4L/min via nasal cannula, SpO2 >90% throughout session, educated pt on pursed lip breathing. Pt voided on toilet without assistance.      Pertinent Vitals/Pain Pain  Assessment Pain Assessment: No/denies pain    Home Living                          Prior Function            PT Goals (current goals can now be found in the care plan section) Acute Rehab PT Goals Patient Stated Goal: to return home, get better PT Goal Formulation: With patient Time For Goal Achievement: 05/09/24 Potential to Achieve Goals: Good Progress towards PT goals: Progressing toward goals     Frequency    Min 2X/week      PT Plan      Co-evaluation              AM-PAC PT 6 Clicks Mobility   Outcome Measure  Help needed turning from your back to your side while in a flat bed without using bedrails?: None Help needed moving from lying on your back to sitting on the side of a flat bed without using bedrails?: A Little Help needed moving to and from a bed to a chair (including a wheelchair)?: A Little Help needed standing up from a chair using your arms (e.g., wheelchair or bedside chair)?: A Little Help needed to walk in hospital room?: A Little Help needed climbing 3-5 steps with a railing? : A Lot 6 Click Score: 18    End of Session Equipment Utilized During Treatment: Oxygen  Activity Tolerance: Patient tolerated treatment well;Patient limited by fatigue Patient left: in chair;with call bell/phone within reach   PT Visit Diagnosis: Difficulty in walking, not elsewhere classified (R26.2);Other abnormalities of gait and mobility (R26.89);Unsteadiness on feet (R26.81);Muscle weakness (generalized) (M62.81)     Time: 8496-8481 PT Time Calculation (min) (ACUTE ONLY): 15 min  Charges:    $Therapeutic Activity: 8-22 mins PT General Charges $$ ACUTE PT VISIT: 1 Visit                     Richerd Pinal, PT, DPT 04/27/24, 3:31 PM   Richerd CHRISTELLA Pinal 04/27/2024, 3:29 PM

## 2024-04-27 NOTE — Progress Notes (Signed)
 PROGRESS NOTE    Wendy [name]  FMW:[id number] DOB: 1940/11/12 DOA: 04/20/2024 PCP: [facility]  Chief Complaint  Patient presents with   Chest Pain   Near Syncope   Diarrhea    Hospital Course:  83 y.o. 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  She denies abdominal pain or vomiting she was hypotensive to the 60s with EMS and was placed on Levophed which was discontinued soon after arrival in the ED. On arrival in the ED, BP 83/57 with otherwise normal vitals Labs notable for normal WBC, hemoglobin 11.9 (baseline 12).  CMP notable for potassium of 2.5 with creatinine of 2.02 (baseline 1.5) Lipase and LFTs WNL and magnesium normal Stool culture ordered from the ED Patient treated with IV potassium Admission requested.   11/20.  Patient having diarrhea for 3 weeks.  Stool studies sent off and negative.  EGD negative.  Prepping for colonoscopy and this evening will have a colonoscopy tomorrow 11/21. Colonoscopy negative but biosies taken 11/22.  Patient diagnosed with pneumonia overnight.  Starting rocephin  and zithromax .  Patient went into rapid afib this am.  Coughing up blood.  Placed on oxygen .  11/22 continued.  Needed to switch over to amiodarone  drip to control heart rate with blood pressure being on the lower side.  Will hold further Lasix  and give a fluid bolus.  Consulted pulmonary for coughing up blood.  With starting amiodarone  will have to change Zithromax  over to doxycycline .  With possible aspiration will change Rocephin  over to Zosyn .  Steroids started by pulmonary.  11/23.  Patient complained of chest pain this morning.  Given a fluid  bolus for low blood pressure.  Troponins trended down from yesterday.  Patient still having some diarrhea and coughing up blood.  This afternoon down to 5 L nasal cannula. 11/24.  Patient still complaining of coughing up blood.  Still having some rib pains.  Still having some diarrhea. 11/25.  Patient still coughing up a little blood still having some diarrhea.  Colon biopsies did not show any pathology.  Supportive care with cholestyramine  and as needed Imodium .  Can consider Xifaxan.  Switch antibiotics for pneumonia to Rocephin  only since Streptococcus pneumonia growing out of sputum culture.  Physical therapy recommending rehab. 11/26.  Still having diarrhea.  Schedule Imodium   Subjective: This morning patient is very distressed by ongoing diarrhea overnight.  She reports that this is significantly altering her quality of life and there is no way she can discharge home until it is resolved.  She is also requesting to speak with GI again.  No family present at bedside this time of our discussion.  I was able to call and update her son, Marcey. Objective: Vitals:   04/27/24 0349 04/27/24 0500 04/27/24 0831 04/27/24 1213  BP: (!) 170/66  (!) 187/69 (!) 164/97  Pulse: 71  70 70  Resp: 20   18  Temp: 97.8 F (36.6 C)  97.7 F (36.5 C) 98 F (36.7 C)  TempSrc:    Oral  SpO2: 97%  95% 99%  Weight:  112 kg    Height:        Intake/Output Summary (Last 24 hours) at 04/27/2024 1453 Last data filed at 04/27/2024 1359 Gross  per 24 hour  Intake 1061.22 ml  Output --  Net 1061.22 ml   Filed Weights   04/25/24 0445 04/26/24 0325 04/27/24 0500  Weight: 106 kg 109.4 kg 112 kg    Examination: General exam: Appears calm and comfortable, NAD  Respiratory system: No work of breathing, symmetric chest wall expansion Cardiovascular system: S1 & S2 heard, RRR.  Gastrointestinal system: Abdomen is nondistended, soft and nontender.  Neuro: Alert and oriented. No focal neurological  deficits. Extremities: Symmetric, expected ROM Skin: No rashes, lesions Psychiatry: Demonstrates appropriate judgement and insight. Mood & affect appropriate for situation.   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 - Stool studies negative - Colonoscopy negative, biopsies unremarkable - Continue with cholestyramine , scheduled Imodium  today - Discontinue allopurinol  - Patient does have history of colon resection, extensive history of IBS, and is on daily antibiotics outpatient for recurrent UTI.  All of the above contributing to her chronic diarrhea.   Generalized deconditioning - Secondary to all of the below.  PT/OT recommending rehab.  TOC consulted for arrangements  Severe sepsis (HCC) -Left upper lobe pneumonia with tachycardia, tachypnea, leukocytosis, lactic acidosis. - Sputum culture with strep pneumoniae sensitive to ceftriaxone  - strep urinary antigen positive - Blood cultures negative - Continue with ceftriaxone  only   Hemoptysis -Prior attending has discussed with pulmonology who believes this is secondary to pneumonia and anticipates it will improve with continue with treatment as above  Acute respiratory failure with hypoxia (HCC) -O2 sat 88% on room air, was on nonrebreather when diagnosed with pneumonia eventually requiring  high flow nasal cannula - Has been able to wean to 2 L today.  Continue to wean as tolerated. - Continue prednisone, begin taper. - Symptomatic support with guaifenesin    Rapid atrial fibrillation (HCC) Now NSR - Amiodarone  drip started 11/22, switched to oral 11/23 - No blood thinners secondary to hemoptysis    Hypotension resolved   Acute renal failure superimposed on stage 3b chronic kidney disease (HCC) -Prerenal secondary to GI losses resulting in hypoperfusion with ATN - Metabolic acidosis resolved - Creatinine on presentation 2.0, has resolved to 1.2 for now - Continue to encourage p.o. intake.  Avoid additional IV fluids given CHF.  Acute on chronic diastolic CHF (congestive heart failure) (HCC) -Continue holding Lasix    Hypokalemia -Replace needed   Increased anion gap metabolic acidosis Secondary to AKI on CKD.  Improved now.    Recurrent syncope -Likely secondary to dehydration, hypotension and hypokalemia   Melena -EGD negative.     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   Hypothyroidism On levothyroxine    Obstructive sleep apnea CPAP nightly if desired   Body mass index is 39.85 kg/m. Obesity Class 2 - Outpatient follow up for lifestyle modification and risk factor management   DVT prophylaxis: SCDs for now   Code Status: Full Code Disposition:  Inpatient, pending SNF. TOC consulted  Consultants:  Treatment Team:  Consulting Physician: Aundria, Ladell POUR, MD Consulting Physician: Jinny Carmine, MD Consulting Physician: Malka Domino,  MD  Procedures:    Antimicrobials:  Anti-infectives (From admission, onward)    Start     Dose/Rate Route Frequency Ordered Stop   04/25/24 2100  cefTRIAXone  (ROCEPHIN ) 2 g in sodium chloride  0.9 % 100 mL IVPB        2 g 200 mL/hr over 30 Minutes Intravenous Every 24 hours 04/25/24 1459     04/23/24 2200  doxycycline  (VIBRAMYCIN ) 100 mg in sodium chloride  0.9 % 250 mL IVPB  Status:  Discontinued        100 mg 125 mL/hr over 120 Minutes Intravenous Every 12 hours 04/23/24 1154 04/26/24 1326   04/23/24 1245  piperacillin -tazobactam (ZOSYN ) IVPB 3.375 g        3.375 g 12.5 mL/hr over 240 Minutes Intravenous Every 8 hours 04/23/24 1148 04/25/24 1747   04/23/24 0745  cefTRIAXone  (ROCEPHIN ) 2 g in sodium chloride  0.9 % 100 mL IVPB  Status:  Discontinued        2 g 200 mL/hr over 30 Minutes Intravenous Every 24 hours 04/23/24 0654 04/23/24 1144   04/23/24 0745  azithromycin  (ZITHROMAX ) 500 mg in sodium chloride  0.9 % 250 mL IVPB  Status:  Discontinued        500 mg 250 mL/hr over 60 Minutes Intravenous Every 24 hours 04/23/24 0654 04/23/24 1154       Data Reviewed: I have personally reviewed following labs and imaging studies CBC: Recent Labs  Lab 04/22/24 2332 04/23/24 0212 04/24/24 0846 04/25/24 0308 04/27/24 0917  WBC 15.0* 13.1* 13.9* 9.6 11.7*  NEUTROABS  --   --   --   --  9.3*  HGB 12.2 11.1* 10.8* 10.5* 12.7  HCT 36.4 33.3* 33.4* 31.7* 39.6  MCV 97.3 97.7 100.6* 99.4 99.2  PLT 209 206 218 218 321   Basic Metabolic Panel: Recent Labs  Lab 04/20/24 2017 04/21/24 0226 04/22/24 2332 04/23/24 0212 04/24/24 0846 04/25/24 0308 04/27/24 0917  NA 141   < > 140 140 136 139 142  K 2.5*   < > 3.4* 3.6 4.7 5.0 5.0  CL 98   < > 103 104 102 108 110  CO2 28   < > 22 24 23 23  20*  GLUCOSE 103*   < > 135* 134* 112* 114* 143*  BUN 27*   < > 19 20 33* 43* 35*  CREATININE 2.02*   < > 1.30* 1.33* 1.61* 1.59* 1.24*  CALCIUM 9.4   < > 8.8* 8.5* 8.7* 8.3* 9.2  MG 2.3  --   --   1.8  --   --   --   PHOS  --   --   --  2.6  --   --   --    < > = values in this interval not displayed.   GFR: Estimated Creatinine Clearance: 45.2 mL/min (A) (by C-G formula based on SCr of 1.24 mg/dL (H)). Liver Function Tests: Recent Labs  Lab  04/20/24 2017 04/27/24 0917  AST 26 35  ALT 13 53*  ALKPHOS 90 132*  BILITOT 0.4 0.4  PROT 7.2 6.6  ALBUMIN 4.2 3.3*   CBG: Recent Labs  Lab 04/23/24 0455 04/24/24 0400 04/25/24 0443 04/26/24 0323 04/27/24 0525  GLUCAP 131* 121* 121* 109* 111*    Recent Results (from the past 240 hours)  Stool culture     Status: None   Collection Time: 04/20/24  9:20 PM   Specimen: Stool  Result Value Ref Range Status   Salmonella/Shigella Screen Final report  Final   Campylobacter Culture Final report  Final   E coli, Shiga toxin Assay Negative Negative Final    Comment: (NOTE) Performed At: [facility] [address] [place], [place] [id number] Jennette Shorter MD Ey:[id number]   STOOL CULTURE REFLEX - RSASHR     Status: None   Collection Time: 04/20/24  9:20 PM  Result Value Ref Range Status   Stool Culture result 1 (RSASHR) Comment  Final    Comment: (NOTE) No Salmonella or Shigella recovered. Performed At: [facility] [address] [place], [place] [id number] Jennette Shorter MD Ey:[id number]   STOOL CULTURE Reflex - CMPCXR     Status: None   Collection Time: 04/20/24  9:20 PM  Result Value Ref Range Status   Stool Culture result 1 (CMPCXR) Comment  Final    Comment: (NOTE) No Campylobacter species isolated. Performed At: [facility] [address] [place], [place] [id number] Jennette Shorter MD Ey:[id number]   C Difficile Quick Screen w PCR reflex     Status: None   Collection Time: 04/21/24  9:00 AM   Specimen: STOOL  Result Value Ref Range Status   C Diff antigen NEGATIVE NEGATIVE Final   C Diff toxin NEGATIVE NEGATIVE Final   C Diff interpretation No C. difficile detected.  Final     Comment: Performed at [facility], [address] [place], [place] [address]  Gastrointestinal Panel by PCR , Stool     Status: None   Collection Time: 04/21/24  9:00 AM   Specimen: Stool  Result Value Ref Range Status   Campylobacter species NOT DETECTED NOT DETECTED Final   Plesimonas shigelloides NOT DETECTED NOT DETECTED Final   Salmonella species NOT DETECTED NOT DETECTED Final   Yersinia enterocolitica NOT DETECTED NOT DETECTED Final   Vibrio species NOT DETECTED NOT DETECTED Final   Vibrio cholerae NOT DETECTED NOT DETECTED Final   Enteroaggregative E coli (EAEC) NOT DETECTED NOT DETECTED Final   Enteropathogenic E coli (EPEC) NOT DETECTED NOT DETECTED Final   Enterotoxigenic E coli (ETEC) NOT DETECTED NOT DETECTED Final   Shiga like toxin producing E coli (STEC) NOT DETECTED NOT DETECTED Final   Shigella/Enteroinvasive E coli (EIEC) NOT DETECTED NOT DETECTED Final   Cryptosporidium NOT DETECTED NOT DETECTED Final   Cyclospora cayetanensis NOT DETECTED NOT DETECTED Final   Entamoeba histolytica NOT DETECTED NOT DETECTED Final   Giardia lamblia NOT DETECTED NOT DETECTED Final   Adenovirus F40/41 NOT DETECTED NOT DETECTED Final   Astrovirus NOT DETECTED NOT DETECTED Final   Norovirus GI/GII NOT DETECTED NOT DETECTED Final   Rotavirus A NOT DETECTED NOT DETECTED Final   Sapovirus (I, II, IV, and V) NOT DETECTED NOT DETECTED Final    Comment: Performed at [facility], [address] [place], [place] [address]  Culture, blood (routine x 2) Call MD if unable to obtain prior to antibiotics being given     Status: None (  Preliminary result)   Collection Time: 04/23/24  7:50 AM   Specimen: BLOOD  Result Value Ref Range Status   Specimen Description BLOOD BLOOD LEFT HAND  Final   Special Requests   Final    BOTTLES DRAWN AEROBIC AND ANAEROBIC Blood Culture results may not be optimal due to an excessive volume of blood received in culture bottles   Culture   Final     NO GROWTH 4 DAYS Performed at [facility], [address] [place], [place] [address]    Report Status PENDING  Incomplete  Resp panel by RT-PCR (RSV, Flu A&B, Covid) Anterior Nasal Swab     Status: None   Collection Time: 04/23/24  9:20 AM   Specimen: Anterior Nasal Swab  Result Value Ref Range Status   SARS Coronavirus 2 by RT PCR NEGATIVE NEGATIVE Final    Comment: (NOTE) SARS-CoV-2 target nucleic acids are NOT DETECTED.  The SARS-CoV-2 RNA is generally detectable in upper respiratory specimens during the acute phase of infection. The lowest concentration of SARS-CoV-2 viral copies this assay can detect is 138 copies/mL. A negative result does not preclude SARS-Cov-2 infection and should not be used as the sole basis for treatment or other patient management decisions. A negative result may occur with  improper specimen collection/handling, submission of specimen other than nasopharyngeal swab, presence of viral mutation(s) within the areas targeted by this assay, and inadequate number of viral copies(<138 copies/mL). A negative result must be combined with clinical observations, patient history, and epidemiological information. The expected result is Negative.  Fact Sheet for Patients:  bloggercourse.com  Fact Sheet for Healthcare Providers:  seriousbroker.it  This test is no t yet approved or cleared by the [place]  FDA and  has been authorized for detection and/or diagnosis of SARS-CoV-2 by FDA under an Emergency Use Authorization (EUA). This EUA will remain  in effect (meaning this test can be used) for the duration of the COVID-19 declaration under Section 564(b)(1) of the Act, 21 U.S.C.section 360bbb-3(b)(1), unless the authorization is terminated  or revoked sooner.       Influenza A by PCR NEGATIVE NEGATIVE Final   Influenza B by PCR NEGATIVE NEGATIVE Final    Comment: (NOTE) The Xpert Xpress  SARS-CoV-2/FLU/RSV plus assay is intended as an aid in the diagnosis of influenza from Nasopharyngeal swab specimens and should not be used as a sole basis for treatment. Nasal washings and aspirates are unacceptable for Xpert Xpress SARS-CoV-2/FLU/RSV testing.  Fact Sheet for Patients: bloggercourse.com  Fact Sheet for Healthcare Providers: seriousbroker.it  This test is not yet approved or cleared by the [place]  FDA and has been authorized for detection and/or diagnosis of SARS-CoV-2 by FDA under an Emergency Use Authorization (EUA). This EUA will remain in effect (meaning this test can be used) for the duration of the COVID-19 declaration under Section 564(b)(1) of the Act, 21 U.S.C. section 360bbb-3(b)(1), unless the authorization is terminated or revoked.     Resp Syncytial Virus by PCR NEGATIVE NEGATIVE Final    Comment: (NOTE) Fact Sheet for Patients: bloggercourse.com  Fact Sheet for Healthcare Providers: seriousbroker.it  This test is not yet approved or cleared by the [place]  FDA and has been authorized for detection and/or diagnosis of SARS-CoV-2 by FDA under an Emergency Use Authorization (EUA). This EUA will remain in effect (meaning this test can be used) for the duration of the COVID-19 declaration under Section 564(b)(1) of the Act, 21 U.S.C. section 360bbb-3(b)(1), unless the authorization is terminated  or revoked.  Performed at [facility], [address] [place], [place] [address]   Culture, blood (Routine X 2) w Reflex to ID Panel     Status: None (Preliminary result)   Collection Time: 04/23/24 10:39 AM   Specimen: BLOOD  Result Value Ref Range Status   Specimen Description BLOOD BLOOD LEFT HAND  Final   Special Requests   Final    BOTTLES DRAWN AEROBIC AND ANAEROBIC Blood Culture adequate volume   Culture   Final    NO GROWTH 4  DAYS Performed at [facility], [address], [place], [place] [address]    Report Status PENDING  Incomplete  MRSA Next Gen by PCR, Nasal     Status: None   Collection Time: 04/23/24 11:42 AM   Specimen: Nasal Mucosa; Nasal Swab  Result Value Ref Range Status   MRSA by PCR Next Gen NOT DETECTED NOT DETECTED Final    Comment: (NOTE) The GeneXpert MRSA Assay (FDA approved for NASAL specimens only), is one component of a comprehensive MRSA colonization surveillance program. It is not intended to diagnose MRSA infection nor to guide or monitor treatment for MRSA infections. Test performance is not FDA approved in patients less than 55 years old. Performed at [facility], [address] [place], [place] [address]   Expectorated Sputum Assessment w Gram Stain, Rflx to Resp Cult     Status: None   Collection Time: 04/23/24 11:42 AM   Specimen: Urine, Suprapubic; Sputum  Result Value Ref Range Status   Specimen Description URINE, SUPRAPUBIC  Final   Special Requests NONE  Final   Sputum evaluation   Final    THIS SPECIMEN IS ACCEPTABLE FOR SPUTUM CULTURE Performed at [facility], [address] [place], [place] [address]    Report Status 04/23/2024 FINAL  Final  Culture, Respiratory w Gram Stain     Status: None   Collection Time: 04/23/24 11:42 AM   Specimen: SPU  Result Value Ref Range Status   Specimen Description   Final    SPUTUM Performed at [facility], [address] [place], [place] [address]    Special Requests   Final    NONE Reflexed from [id number] Performed at [facility], [address] [place], [place] [address]    Gram Stain   Final    FEW WBC PRESENT, PREDOMINANTLY PMN MODERATE GRAM POSITIVE COCCI Performed at [facility], [address] [place], [place] [address]    Culture FEW STREPTOCOCCUS PNEUMONIAE  Final   Report Status 04/26/2024 FINAL  Final   Organism ID, Bacteria STREPTOCOCCUS  PNEUMONIAE  Final      Susceptibility   Streptococcus pneumoniae - MIC*    ERYTHROMYCIN <=0.12 SENSITIVE Sensitive     LEVOFLOXACIN  1 SENSITIVE Sensitive     VANCOMYCIN 0.5 SENSITIVE Sensitive     PENICILLIN (meningitis) <=0.06 SENSITIVE Sensitive     PENO - penicillin <=0.06      PENICILLIN (non-meningitis) <=0.06 SENSITIVE Sensitive     PENICILLIN (oral) <=0.06 SENSITIVE Sensitive     CEFTRIAXONE  (non-meningitis) <=0.12 SENSITIVE Sensitive     CEFTRIAXONE  (meningitis) <=0.12 SENSITIVE Sensitive     * FEW STREPTOCOCCUS PNEUMONIAE     Radiology Studies: No results found.  Scheduled Meds:  allopurinol   50 mg Oral Daily   amiodarone   400 mg Oral BID   amLODipine   5 mg Oral Daily   budesonide  (PULMICORT ) nebulizer solution  0.5 mg Nebulization BID  cholestyramine  light  4 g Oral BID   gabapentin   300 mg Oral TID   levothyroxine   50 mcg Oral Q0600   lidocaine   1 patch Transdermal Q24H   liver oil-zinc  oxide   Topical BID   loperamide   2 mg Oral Q8H   methylPREDNISolone  (SOLU-MEDROL ) injection  20 mg Intravenous Q12H   pantoprazole   40 mg Oral QHS   pravastatin   80 mg Oral Daily   sodium chloride  flush  3 mL Intravenous Q12H   Continuous Infusions:  cefTRIAXone  (ROCEPHIN )  IV Stopped (04/26/24 2148)     LOS: 4 days  MDM: Patient is high risk for one or more organ failure.  They necessitate ongoing hospitalization for continued IV therapies and subsequent lab monitoring. Total time spent interpreting labs and vitals, reviewing the medical record, coordinating care amongst consultants and care team members, directly assessing and discussing care with the patient and/or family: 55 min  Emiline Mancebo, DO [facility]  To contact the attending physician between 7A-7P please use Epic Chat. To contact the covering physician during after hours 7P-7A, please review Amion.  04/27/2024, 2:53 PM   *This document has been created with the assistance of dictation software. Please  excuse typographical errors. *

## 2024-04-27 NOTE — Progress Notes (Signed)
 Family came to desk with multiple questions. Pt and family are asking about the continued diarrhea. Patient states she has had 5 episodes since midnight. She believes that the GI issues are what caused all her other issues and it is being ignored. She is also aware that all the studies and tests have been negative so is concerned its a physical problem such as short gut syndrome. She would like to speak with Dr Jinny or someone from GI about this. Also she does not want to leave without some answers as she is concerned that she will go home and end up right back here. Dr Leesa notified via epic chat. Encouraged patient and family to discuss this with MD when she makes rounds. Both verbalize understanding.

## 2024-04-27 NOTE — Progress Notes (Signed)
 Wendy Copping, MD [facility]   [address] [place], [place] [address] Phone: [phone] Fax : [phone]   Subjective: Was notified about the patient having questions about her GI issues.  The patient had her pathology come back only yesterday which showed signs consistent with an infection versus a drug reaction.  The patient has had this 3 months straight so therefore an infection is unlikely in addition to the patient's GI panel being negative.  Yesterday after the pathology was reported I spoke to the hospitalist who then contacted pharmacy about reviewing the medications for a possible culprit. I spoke to the patient today about the findings being consistent with a possible medication causing her issues.  She states that she has had intermittent diarrhea on and off throughout her life but now it is more prevalent. The patient's family was not with the patient when I came to see her this morning.  But I did speak extensively with the patient her main concern is follow-up as an outpatient.  She follows up with [facility] and reports that there was a referral to GI but she feels like they never followed up with her.   Objective: Vital signs in last 24 hours: Vitals:   04/26/24 2318 04/27/24 0349 04/27/24 0500 04/27/24 0831  BP: (!) 143/67 (!) 170/66  (!) 187/69  Pulse: 72 71  70  Resp: 20 20    Temp: (!) 97.5 F (36.4 C) 97.8 F (36.6 C)  97.7 F (36.5 C)  TempSrc:      SpO2: 99% 97%  95%  Weight:   112 kg   Height:       Weight change: 2.6 kg  Intake/Output Summary (Last 24 hours) at 04/27/2024 1123 Last data filed at 04/27/2024 9095 Gross per 24 hour  Intake 881.22 ml  Output --  Net 881.22 ml     Exam: General: The patient is sitting up in the bed in no apparent distress alert and oriented x 3.   Lab Results: @LABTEST2 @ Micro Results: Recent Results (from the past 240 hours)  Stool culture     Status: None   Collection Time: 04/20/24  9:20 PM    Specimen: Stool  Result Value Ref Range Status   Salmonella/Shigella Screen Final report  Final   Campylobacter Culture Final report  Final   E coli, Shiga toxin Assay Negative Negative Final    Comment: (NOTE) Performed At: [facility] [address] [place], [place] [id number] Jennette Shorter MD Ey:[id number]   STOOL CULTURE REFLEX - RSASHR     Status: None   Collection Time: 04/20/24  9:20 PM  Result Value Ref Range Status   Stool Culture result 1 (RSASHR) Comment  Final    Comment: (NOTE) No Salmonella or Shigella recovered. Performed At: [facility] [address] [place], [place] [id number] Jennette Shorter MD Ey:[id number]   STOOL CULTURE Reflex - CMPCXR     Status: None   Collection Time: 04/20/24  9:20 PM  Result Value Ref Range Status   Stool Culture result 1 (CMPCXR) Comment  Final    Comment: (NOTE) No Campylobacter species isolated. Performed At: [facility] [address] [place], [place] [id number] Jennette Shorter MD Ey:[id number]   C Difficile Quick Screen w PCR reflex     Status: None   Collection Time: 04/21/24  9:00 AM   Specimen: STOOL  Result Value Ref Range Status   C Diff antigen NEGATIVE NEGATIVE Final   C Diff toxin  NEGATIVE NEGATIVE Final   C Diff interpretation No C. difficile detected.  Final    Comment: Performed at [facility], [address] [place], [place] [address]  Gastrointestinal Panel by PCR , Stool     Status: None   Collection Time: 04/21/24  9:00 AM   Specimen: Stool  Result Value Ref Range Status   Campylobacter species NOT DETECTED NOT DETECTED Final   Plesimonas shigelloides NOT DETECTED NOT DETECTED Final   Salmonella species NOT DETECTED NOT DETECTED Final   Yersinia enterocolitica NOT DETECTED NOT DETECTED Final   Vibrio species NOT DETECTED NOT DETECTED Final   Vibrio cholerae NOT DETECTED NOT DETECTED Final   Enteroaggregative E coli (EAEC) NOT DETECTED NOT DETECTED Final    Enteropathogenic E coli (EPEC) NOT DETECTED NOT DETECTED Final   Enterotoxigenic E coli (ETEC) NOT DETECTED NOT DETECTED Final   Shiga like toxin producing E coli (STEC) NOT DETECTED NOT DETECTED Final   Shigella/Enteroinvasive E coli (EIEC) NOT DETECTED NOT DETECTED Final   Cryptosporidium NOT DETECTED NOT DETECTED Final   Cyclospora cayetanensis NOT DETECTED NOT DETECTED Final   Entamoeba histolytica NOT DETECTED NOT DETECTED Final   Giardia lamblia NOT DETECTED NOT DETECTED Final   Adenovirus F40/41 NOT DETECTED NOT DETECTED Final   Astrovirus NOT DETECTED NOT DETECTED Final   Norovirus GI/GII NOT DETECTED NOT DETECTED Final   Rotavirus A NOT DETECTED NOT DETECTED Final   Sapovirus (I, II, IV, and V) NOT DETECTED NOT DETECTED Final    Comment: Performed at [facility], [address] [place], [place] [address]  Culture, blood (routine x 2) Call MD if unable to obtain prior to antibiotics being given     Status: None (Preliminary result)   Collection Time: 04/23/24  7:50 AM   Specimen: BLOOD  Result Value Ref Range Status   Specimen Description BLOOD BLOOD LEFT HAND  Final   Special Requests   Final    BOTTLES DRAWN AEROBIC AND ANAEROBIC Blood Culture results may not be optimal due to an excessive volume of blood received in culture bottles   Culture   Final    NO GROWTH 4 DAYS Performed at [facility], [address] [place], [place] [address]    Report Status PENDING  Incomplete  Resp panel by RT-PCR (RSV, Flu A&B, Covid) Anterior Nasal Swab     Status: None   Collection Time: 04/23/24  9:20 AM   Specimen: Anterior Nasal Swab  Result Value Ref Range Status   SARS Coronavirus 2 by RT PCR NEGATIVE NEGATIVE Final    Comment: (NOTE) SARS-CoV-2 target nucleic acids are NOT DETECTED.  The SARS-CoV-2 RNA is generally detectable in upper respiratory specimens during the acute phase of infection. The lowest concentration of SARS-CoV-2 viral copies this assay can  detect is 138 copies/mL. A negative result does not preclude SARS-Cov-2 infection and should not be used as the sole basis for treatment or other patient management decisions. A negative result may occur with  improper specimen collection/handling, submission of specimen other than nasopharyngeal swab, presence of viral mutation(s) within the areas targeted by this assay, and inadequate number of viral copies(<138 copies/mL). A negative result must be combined with clinical observations, patient history, and epidemiological information. The expected result is Negative.  Fact Sheet for Patients:  bloggercourse.com  Fact Sheet for Healthcare Providers:  seriousbroker.it  This test is no t yet approved or cleared by the [place]  FDA and  has been authorized for detection  and/or diagnosis of SARS-CoV-2 by FDA under an Emergency Use Authorization (EUA). This EUA will remain  in effect (meaning this test can be used) for the duration of the COVID-19 declaration under Section 564(b)(1) of the Act, 21 U.S.C.section 360bbb-3(b)(1), unless the authorization is terminated  or revoked sooner.       Influenza A by PCR NEGATIVE NEGATIVE Final   Influenza B by PCR NEGATIVE NEGATIVE Final    Comment: (NOTE) The Xpert Xpress SARS-CoV-2/FLU/RSV plus assay is intended as an aid in the diagnosis of influenza from Nasopharyngeal swab specimens and should not be used as a sole basis for treatment. Nasal washings and aspirates are unacceptable for Xpert Xpress SARS-CoV-2/FLU/RSV testing.  Fact Sheet for Patients: bloggercourse.com  Fact Sheet for Healthcare Providers: seriousbroker.it  This test is not yet approved or cleared by the [place]  FDA and has been authorized for detection and/or diagnosis of SARS-CoV-2 by FDA under an Emergency Use Authorization (EUA). This EUA will remain in  effect (meaning this test can be used) for the duration of the COVID-19 declaration under Section 564(b)(1) of the Act, 21 U.S.C. section 360bbb-3(b)(1), unless the authorization is terminated or revoked.     Resp Syncytial Virus by PCR NEGATIVE NEGATIVE Final    Comment: (NOTE) Fact Sheet for Patients: bloggercourse.com  Fact Sheet for Healthcare Providers: seriousbroker.it  This test is not yet approved or cleared by the [place]  FDA and has been authorized for detection and/or diagnosis of SARS-CoV-2 by FDA under an Emergency Use Authorization (EUA). This EUA will remain in effect (meaning this test can be used) for the duration of the COVID-19 declaration under Section 564(b)(1) of the Act, 21 U.S.C. section 360bbb-3(b)(1), unless the authorization is terminated or revoked.  Performed at [facility], [address] [place], [place] [address]   Culture, blood (Routine X 2) w Reflex to ID Panel     Status: None (Preliminary result)   Collection Time: 04/23/24 10:39 AM   Specimen: BLOOD  Result Value Ref Range Status   Specimen Description BLOOD BLOOD LEFT HAND  Final   Special Requests   Final    BOTTLES DRAWN AEROBIC AND ANAEROBIC Blood Culture adequate volume   Culture   Final    NO GROWTH 4 DAYS Performed at [facility], [address], [place], [place] [address]    Report Status PENDING  Incomplete  MRSA Next Gen by PCR, Nasal     Status: None   Collection Time: 04/23/24 11:42 AM   Specimen: Nasal Mucosa; Nasal Swab  Result Value Ref Range Status   MRSA by PCR Next Gen NOT DETECTED NOT DETECTED Final    Comment: (NOTE) The GeneXpert MRSA Assay (FDA approved for NASAL specimens only), is one component of a comprehensive MRSA colonization surveillance program. It is not intended to diagnose MRSA infection nor to guide or monitor treatment for MRSA infections. Test performance is not FDA  approved in patients less than 45 years old. Performed at [facility], [address] [place], [place] [address]   Expectorated Sputum Assessment w Gram Stain, Rflx to Resp Cult     Status: None   Collection Time: 04/23/24 11:42 AM   Specimen: Urine, Suprapubic; Sputum  Result Value Ref Range Status   Specimen Description URINE, SUPRAPUBIC  Final   Special Requests NONE  Final   Sputum evaluation   Final    THIS SPECIMEN IS ACCEPTABLE FOR SPUTUM CULTURE Performed at [facility], [address] [place], [place] [address]    Report Status 04/23/2024 FINAL  Final  Culture, Respiratory w Gram Stain     Status: None   Collection Time: 04/23/24 11:42 AM   Specimen: SPU  Result Value Ref Range Status   Specimen Description   Final    SPUTUM Performed at [facility], [address], [place], [place] [address]    Special Requests   Final    NONE Reflexed from [id number] Performed at [facility], [address] [place], [place] [address]    Gram Stain   Final    FEW WBC PRESENT, PREDOMINANTLY PMN MODERATE GRAM POSITIVE COCCI Performed at [facility], [address], [place], [place] [address]    Culture FEW STREPTOCOCCUS PNEUMONIAE  Final   Report Status 04/26/2024 FINAL  Final   Organism ID, Bacteria STREPTOCOCCUS PNEUMONIAE  Final      Susceptibility   Streptococcus pneumoniae - MIC*    ERYTHROMYCIN <=0.12 SENSITIVE Sensitive     LEVOFLOXACIN  1 SENSITIVE Sensitive     VANCOMYCIN 0.5 SENSITIVE Sensitive     PENICILLIN (meningitis) <=0.06 SENSITIVE Sensitive     PENO - penicillin <=0.06      PENICILLIN (non-meningitis) <=0.06 SENSITIVE Sensitive     PENICILLIN (oral) <=0.06 SENSITIVE Sensitive     CEFTRIAXONE  (non-meningitis) <=0.12 SENSITIVE Sensitive     CEFTRIAXONE  (meningitis) <=0.12 SENSITIVE Sensitive     * FEW STREPTOCOCCUS PNEUMONIAE   Studies/Results: No results found. Medications: I have reviewed the patient's current  medications. Scheduled Meds:  allopurinol   50 mg Oral Daily   amiodarone   400 mg Oral BID   amLODipine   5 mg Oral Daily   budesonide  (PULMICORT ) nebulizer solution  0.5 mg Nebulization BID   cholestyramine  light  4 g Oral BID   gabapentin   300 mg Oral TID   levothyroxine   50 mcg Oral Q0600   lidocaine   1 patch Transdermal Q24H   liver oil-zinc  oxide   Topical BID   loperamide   2 mg Oral Q8H   methylPREDNISolone  (SOLU-MEDROL ) injection  20 mg Intravenous Q12H   pantoprazole   40 mg Oral QHS   pravastatin   80 mg Oral Daily   sodium chloride  flush  3 mL Intravenous Q12H   Continuous Infusions:  cefTRIAXone  (ROCEPHIN )  IV Stopped (04/26/24 2148)   PRN Meds:.acetaminophen  **OR** acetaminophen , albuterol , guaiFENesin , HYDROcodone -acetaminophen , morphine  injection, nitroGLYCERIN , ondansetron  **OR** ondansetron  (ZOFRAN ) IV   Assessment: Principal Problem:   Severe sepsis (HCC) Active Problems:   hx: breast cancer, right, IDC, 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  It is likely that this is medication related but there is likely a component of irritable bowel syndrome due to the patient having been on chronic antibiotics causing issues with her gut flora and a long history of off and on diarrhea.  Hopefully regulate scheduled Imodium  and cholestyramine  will help her diarrhea.  The patient has been told that she  should contact the local GI offices when she is discharged for a sooner appointment or to contact her [facility] primary doctor to see if that doctor can get her in with one of the physician she refers to in a timely fashion.   LOS: 4 days   Wendy Copping, MD.FACG 04/27/2024, 11:23 AM Pager [phone] 7am-5pm  Check AMION for 5pm -7am coverage and on weekends

## 2024-04-27 NOTE — Plan of Care (Signed)
  Problem: Education: Goal: Knowledge of General Education information will improve Description: Including pain rating scale, medication(s)/side effects and non-pharmacologic comfort measures Outcome: Progressing   Problem: Health Behavior/Discharge Planning: Goal: Ability to manage health-related needs will improve Outcome: Progressing   Problem: Activity: Goal: Risk for activity intolerance will decrease Outcome: Progressing   Problem: Nutrition: Goal: Adequate nutrition will be maintained Outcome: Progressing   Problem: Elimination: Goal: Will not experience complications related to bowel motility Outcome: Progressing Goal: Will not experience complications related to urinary retention Outcome: Progressing   Problem: Pain Managment: Goal: General experience of comfort will improve and/or be controlled Outcome: Progressing   Problem: Safety: Goal: Ability to remain free from injury will improve Outcome: Progressing   Problem: Skin Integrity: Goal: Risk for impaired skin integrity will decrease Outcome: Progressing

## 2024-04-28 DIAGNOSIS — R6520 Severe sepsis without septic shock: Secondary | ICD-10-CM | POA: Diagnosis not present

## 2024-04-28 DIAGNOSIS — A419 Sepsis, unspecified organism: Secondary | ICD-10-CM | POA: Diagnosis not present

## 2024-04-28 LAB — CULTURE, BLOOD (ROUTINE X 2)
Culture: NO GROWTH
Culture: NO GROWTH
Special Requests: ADEQUATE

## 2024-04-28 LAB — GLUCOSE, CAPILLARY: Glucose-Capillary: 77 mg/dL (ref 70–99)

## 2024-04-28 MED ORDER — MENTHOL 3 MG MT LOZG
1.0000 | LOZENGE | OROMUCOSAL | Status: DC | PRN
  Administered 2024-04-28 – 2024-05-03 (×4): 3 mg via ORAL
  Filled 2024-04-28 (×3): qty 9

## 2024-04-28 NOTE — Plan of Care (Signed)

## 2024-04-28 NOTE — Progress Notes (Signed)
 PROGRESS NOTE    Wendy [name]  FMW:[id number] DOB: 11-02-1940 DOA: 04/20/2024 PCP: [facility]  Chief Complaint  Patient presents with   Chest Pain   Near Syncope   Diarrhea    Hospital Course:  83 y.o. 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  She denies abdominal pain or vomiting she was hypotensive to the 60s with EMS and was placed on Levophed which was discontinued soon after arrival in the ED. On arrival in the ED, BP 83/57 with otherwise normal vitals Labs notable for normal WBC, hemoglobin 11.9 (baseline 12).  CMP notable for potassium of 2.5 with creatinine of 2.02 (baseline 1.5) Lipase and LFTs WNL and magnesium normal Stool culture ordered from the ED Patient treated with IV potassium Admission requested.   11/20.  Patient having diarrhea for 3 weeks.  Stool studies sent off and negative.  EGD negative.  Prepping for colonoscopy and this evening will have a colonoscopy tomorrow 11/21. Colonoscopy negative but biosies taken 11/22.  Patient diagnosed with pneumonia overnight.  Starting rocephin  and zithromax .  Patient went into rapid afib this am.  Coughing up blood.  Placed on oxygen .  11/22 continued.  Needed to switch over to amiodarone  drip to control heart rate with blood pressure being on the lower side.  Will hold further Lasix  and give a fluid bolus.  Consulted pulmonary for coughing up blood.  With starting amiodarone  will have to change Zithromax  over to doxycycline .  With possible aspiration will change Rocephin  over to Zosyn .  Steroids started by pulmonary.  11/23.  Patient complained of chest pain this morning.  Given a fluid  bolus for low blood pressure.  Troponins trended down from yesterday.  Patient still having some diarrhea and coughing up blood.  This afternoon down to 5 L nasal cannula. 11/24.  Patient still complaining of coughing up blood.  Still having some rib pains.  Still having some diarrhea. 11/25.  Patient still coughing up a little blood still having some diarrhea.  Colon biopsies did not show any pathology.  Supportive care with cholestyramine  and as needed Imodium .  Can consider Xifaxan.  Switch antibiotics for pneumonia to Rocephin  only since Streptococcus pneumonia growing out of sputum culture.  Physical therapy recommending rehab. 11/26.  Still having diarrhea.  Schedule Imodium  11/27 much improvement in diarrhea.  Patient still very weak.  She is hesitant to discharge home today.  She is refusing SNF and home health  Subjective: Reports significant improvement in her diarrhea.  Reports she finally slept well overnight. We discussed going home today now that she has had improvement in her diarrhea and she reports that she is not ready to go home.  She would like another day to make sure the medication keeps working. We also extensively discussed her refusal of home health and skilled nursing facility.  She maintains that she is not interested in the services and says  this would be a waste of my time and their time.  Objective: Vitals:   04/27/24 2028 04/27/24 2330 04/28/24 0334 04/28/24 0537  BP:  (!) 169/76 (!) 172/70   Pulse:  70 70   Resp:  20 20   Temp:  98 F (36.7 C) (!) 97.4 F (  36.3 C)   TempSrc:  Oral    SpO2: 97% 100% 97%   Weight:    109.2 kg  Height:        Intake/Output Summary (Last 24 hours) at 04/28/2024 0719 Last data filed at 04/27/2024 2122 Gross per 24 hour  Intake 683 ml  Output --  Net 683 ml   Filed Weights   04/26/24 0325 04/27/24 0500 04/28/24 0537  Weight: 109.4 kg 112 kg 109.2 kg    Examination: General exam: Appears calm and comfortable, NAD   Respiratory system: No work of breathing, symmetric chest wall expansion Cardiovascular system: S1 & S2 heard, RRR.  Gastrointestinal system: Abdomen is nondistended, soft and nontender.  Neuro: Alert and oriented. No focal neurological deficits. Extremities: Symmetric, expected ROM Skin: No rashes, lesions Psychiatry: Demonstrates appropriate judgement and insight. Mood & affect appropriate for situation.   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 - Stool studies negative - Colonoscopy negative, biopsies unremarkable - Continue with cholestyramine  and scheduled Imodium  which appears to be helping significantly.- Allopurinol  discontinued, watch for gout flares - Patient does have history of colon resection, extensive history of IBS, and is on daily antibiotics outpatient for recurrent UTI.  All of the above contributing to her chronic diarrhea.   Generalized deconditioning - Secondary to all of the below.  PT/OT recommending rehab.  Patient is refusing this - I discussed extensively with her, she is also refusing home health services.  Severe sepsis (HCC) Strep pneumo pneumonia -Left upper lobe pneumonia with tachycardia, tachypnea, leukocytosis, lactic acidosis. - Strep Pneumo ag and sputum positive - Continue ceftriaxone  through 11/28 -  Blood cultures negative   Hemoptysis -Prior attending has discussed with pulmonology who believes this is secondary to pneumonia and anticipates it will improve with continue with treatment as above  Acute respiratory failure with hypoxia (HCC) -O2 sat 88% on room air, was on nonrebreather when diagnosed with pneumonia eventually requiring high flow nasal cannula - Currently on 2 to 3 L, hopefully wean to room air today - Continue with prednisone taper - Symptomatic support with as needed guaifenesin    Rapid atrial fibrillation (HCC) Now NSR - Amiodarone  drip started 11/22, switched to oral 11/23 - No blood thinners secondary to hemoptysis    Hypotension resolved   Acute renal failure superimposed on stage 3b chronic kidney disease (HCC) -Prerenal secondary to GI losses resulting in hypoperfusion with ATN - Metabolic acidosis resolved - Creatinine on presentation 2.0, has resolved to 1.2 for now - Continue to encourage p.o. intake.  Avoid additional IV fluids given CHF.  Acute on chronic diastolic CHF (congestive heart failure) (HCC) -Continue holding Lasix    Hypokalemia -Replace needed   Increased anion gap metabolic acidosis Secondary to AKI on CKD.  Improved now.  Recurrent syncope -Likely secondary to dehydration,  hypotension and hypokalemia   Melena -EGD negative.     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   Hypothyroidism On levothyroxine    Obstructive sleep apnea CPAP nightly if desired   Body mass index is 38.86 kg/m. Obesity Class 2 - Outpatient follow up for  lifestyle modification and risk factor management   DVT prophylaxis: SCDs for now   Code Status: Full Code Disposition: Inpatient, hopefully home in a.m. if diarrhea continues to improve  Consultants:  Treatment Team:  Consulting Physician: Aundria, Ladell POUR, MD Consulting Physician: Jinny Carmine, MD Consulting Physician: Malka Domino, MD  Procedures:    Antimicrobials:  Anti-infectives (From admission, onward)    Start     Dose/Rate Route Frequency Ordered Stop   04/25/24 2100  cefTRIAXone  (ROCEPHIN ) 2 g in sodium chloride  0.9 % 100 mL IVPB        2 g 200 mL/hr over 30 Minutes Intravenous Every 24 hours 04/25/24 1459     04/23/24 2200  doxycycline  (VIBRAMYCIN ) 100 mg in sodium chloride  0.9 % 250 mL IVPB  Status:  Discontinued        100 mg 125 mL/hr over 120 Minutes Intravenous Every 12 hours 04/23/24 1154 04/26/24 1326   04/23/24 1245  piperacillin -tazobactam (ZOSYN ) IVPB 3.375 g        3.375 g 12.5 mL/hr over 240 Minutes Intravenous Every 8 hours 04/23/24 1148 04/25/24 1747   04/23/24 0745  cefTRIAXone  (ROCEPHIN ) 2 g in sodium chloride  0.9 % 100 mL IVPB  Status:  Discontinued        2 g 200 mL/hr over 30 Minutes Intravenous Every 24 hours 04/23/24 0654 04/23/24 1144   04/23/24 0745  azithromycin  (ZITHROMAX ) 500 mg in sodium chloride  0.9 % 250 mL IVPB  Status:  Discontinued        500 mg 250 mL/hr over 60 Minutes Intravenous Every 24 hours 04/23/24 0654 04/23/24 1154       Data Reviewed: I have personally reviewed following labs and imaging studies CBC: Recent Labs  Lab 04/22/24 2332 04/23/24 0212 04/24/24 0846 04/25/24 0308 04/27/24 0917  WBC 15.0* 13.1* 13.9* 9.6 11.7*  NEUTROABS  --   --   --   --  9.3*  HGB 12.2 11.1* 10.8* 10.5* 12.7  HCT 36.4 33.3* 33.4* 31.7* 39.6  MCV 97.3 97.7 100.6* 99.4 99.2  PLT 209 206 218 218 321   Basic Metabolic Panel: Recent Labs  Lab 04/22/24 2332 04/23/24 0212 04/24/24 0846 04/25/24 0308 04/27/24 0917  NA 140  140 136 139 142  K 3.4* 3.6 4.7 5.0 5.0  CL 103 104 102 108 110  CO2 22 24 23 23  20*  GLUCOSE 135* 134* 112* 114* 143*  BUN 19 20 33* 43* 35*  CREATININE 1.30* 1.33* 1.61* 1.59* 1.24*  CALCIUM 8.8* 8.5* 8.7* 8.3* 9.2  MG  --  1.8  --   --   --   PHOS  --  2.6  --   --   --    GFR: Estimated Creatinine Clearance: 44.5 mL/min (A) (by C-G formula based on SCr of 1.24 mg/dL (H)). Liver Function Tests: Recent Labs  Lab  04/27/24 0917  AST 35  ALT 53*  ALKPHOS 132*  BILITOT 0.4  PROT 6.6  ALBUMIN 3.3*   CBG: Recent Labs  Lab 04/24/24 0400 04/25/24 0443 04/26/24 0323 04/27/24 0525 04/28/24 0606  GLUCAP 121* 121* 109* 111* 77    Recent Results (from the past 240 hours)  Stool culture     Status: None   Collection Time: 04/20/24  9:20 PM   Specimen: Stool  Result Value Ref Range Status   Salmonella/Shigella Screen Final report  Final   Campylobacter Culture Final report  Final   E coli, Shiga toxin Assay Negative Negative Final    Comment: (NOTE) Performed At: [facility] [address] [place], [place] [id number] Jennette Shorter MD Ey:[id number]   STOOL CULTURE REFLEX - RSASHR     Status: None   Collection Time: 04/20/24  9:20 PM  Result Value Ref Range Status   Stool Culture result 1 (RSASHR) Comment  Final    Comment: (NOTE) No Salmonella or Shigella recovered. Performed At: [facility] [address] [place], [place] [id number] Jennette Shorter MD Ey:[id number]   STOOL CULTURE Reflex - CMPCXR     Status: None   Collection Time: 04/20/24  9:20 PM  Result Value Ref Range Status   Stool Culture result 1 (CMPCXR) Comment  Final    Comment: (NOTE) No Campylobacter species isolated. Performed At: [facility] [address] [place], [place] [id number] Jennette Shorter MD Ey:[id number]   C Difficile Quick Screen w PCR reflex     Status: None   Collection Time: 04/21/24  9:00 AM   Specimen: STOOL  Result Value Ref Range Status   C Diff  antigen NEGATIVE NEGATIVE Final   C Diff toxin NEGATIVE NEGATIVE Final   C Diff interpretation No C. difficile detected.  Final    Comment: Performed at [facility], [address] [place], [place] [address]  Gastrointestinal Panel by PCR , Stool     Status: None   Collection Time: 04/21/24  9:00 AM   Specimen: Stool  Result Value Ref Range Status   Campylobacter species NOT DETECTED NOT DETECTED Final   Plesimonas shigelloides NOT DETECTED NOT DETECTED Final   Salmonella species NOT DETECTED NOT DETECTED Final   Yersinia enterocolitica NOT DETECTED NOT DETECTED Final   Vibrio species NOT DETECTED NOT DETECTED Final   Vibrio cholerae NOT DETECTED NOT DETECTED Final   Enteroaggregative E coli (EAEC) NOT DETECTED NOT DETECTED Final   Enteropathogenic E coli (EPEC) NOT DETECTED NOT DETECTED Final   Enterotoxigenic E coli (ETEC) NOT DETECTED NOT DETECTED Final   Shiga like toxin producing E coli (STEC) NOT DETECTED NOT DETECTED Final   Shigella/Enteroinvasive E coli (EIEC) NOT DETECTED NOT DETECTED Final   Cryptosporidium NOT DETECTED NOT DETECTED Final   Cyclospora cayetanensis NOT DETECTED NOT DETECTED Final   Entamoeba histolytica NOT DETECTED NOT DETECTED Final   Giardia lamblia NOT DETECTED NOT DETECTED Final   Adenovirus F40/41 NOT DETECTED NOT DETECTED Final   Astrovirus NOT DETECTED NOT DETECTED Final   Norovirus GI/GII NOT DETECTED NOT DETECTED Final   Rotavirus A NOT DETECTED NOT DETECTED Final   Sapovirus (I, II, IV, and V) NOT DETECTED NOT DETECTED Final    Comment: Performed at [facility], [address] [place], [place] [address]  Culture, blood (routine x 2) Call MD if unable to obtain prior to antibiotics being given     Status: None (Preliminary result)   Collection Time: 04/23/24  7:50 AM   Specimen: BLOOD  Result Value Ref Range Status   Specimen Description BLOOD BLOOD LEFT HAND  Final   Special Requests   Final    BOTTLES DRAWN AEROBIC AND  ANAEROBIC Blood Culture results may not be optimal due to an excessive volume of blood received in culture bottles   Culture   Final    NO GROWTH 4 DAYS Performed at [facility], [address], [place], [place] [address]    Report Status PENDING  Incomplete  Resp panel by RT-PCR (RSV, Flu A&B, Covid) Anterior Nasal Swab     Status: None   Collection Time: 04/23/24  9:20 AM   Specimen: Anterior Nasal Swab  Result Value Ref Range Status   SARS Coronavirus 2 by RT PCR NEGATIVE NEGATIVE Final    Comment: (NOTE) SARS-CoV-2 target nucleic acids are NOT DETECTED.  The SARS-CoV-2 RNA is generally detectable in upper respiratory specimens during the acute phase of infection. The lowest concentration of SARS-CoV-2 viral copies this assay can detect is 138 copies/mL. A negative result does not preclude SARS-Cov-2 infection and should not be used as the sole basis for treatment or other patient management decisions. A negative result may occur with  improper specimen collection/handling, submission of specimen other than nasopharyngeal swab, presence of viral mutation(s) within the areas targeted by this assay, and inadequate number of viral copies(<138 copies/mL). A negative result must be combined with clinical observations, patient history, and epidemiological information. The expected result is Negative.  Fact Sheet for Patients:  bloggercourse.com  Fact Sheet for Healthcare Providers:  seriousbroker.it  This test is no t yet approved or cleared by the [place]  FDA and  has been authorized for detection and/or diagnosis of SARS-CoV-2 by FDA under an Emergency Use Authorization (EUA). This EUA will remain  in effect (meaning this test can be used) for the duration of the COVID-19 declaration under Section 564(b)(1) of the Act, 21 U.S.C.section 360bbb-3(b)(1), unless the authorization is terminated  or revoked sooner.        Influenza A by PCR NEGATIVE NEGATIVE Final   Influenza B by PCR NEGATIVE NEGATIVE Final    Comment: (NOTE) The Xpert Xpress SARS-CoV-2/FLU/RSV plus assay is intended as an aid in the diagnosis of influenza from Nasopharyngeal swab specimens and should not be used as a sole basis for treatment. Nasal washings and aspirates are unacceptable for Xpert Xpress SARS-CoV-2/FLU/RSV testing.  Fact Sheet for Patients: bloggercourse.com  Fact Sheet for Healthcare Providers: seriousbroker.it  This test is not yet approved or cleared by the [place]  FDA and has been authorized for detection and/or diagnosis of SARS-CoV-2 by FDA under an Emergency Use Authorization (EUA). This EUA will remain in effect (meaning this test can be used) for the duration of the COVID-19 declaration under Section 564(b)(1) of the Act, 21 U.S.C. section 360bbb-3(b)(1), unless the authorization is terminated or revoked.     Resp Syncytial Virus by PCR NEGATIVE NEGATIVE Final    Comment: (NOTE) Fact Sheet for Patients: bloggercourse.com  Fact Sheet for Healthcare Providers: seriousbroker.it  This test is not yet approved or cleared by the [place]  FDA and has been authorized for detection and/or diagnosis of SARS-CoV-2 by FDA under an Emergency Use Authorization (EUA). This EUA will remain in effect (meaning this test can be used) for the duration of the COVID-19 declaration under Section 564(b)(1) of the Act, 21 U.S.C. section 360bbb-3(b)(1), unless the authorization is terminated or revoked.  Performed at [facility],  [address] [place], [place] [address]   Culture, blood (Routine X 2) w Reflex to ID Panel     Status: None (Preliminary result)   Collection Time: 04/23/24 10:39 AM   Specimen: BLOOD  Result Value Ref Range Status   Specimen Description BLOOD BLOOD LEFT HAND   Final   Special Requests   Final    BOTTLES DRAWN AEROBIC AND ANAEROBIC Blood Culture adequate volume   Culture   Final    NO GROWTH 4 DAYS Performed at [facility], [address], [place], [place] [address]    Report Status PENDING  Incomplete  MRSA Next Gen by PCR, Nasal     Status: None   Collection Time: 04/23/24 11:42 AM   Specimen: Nasal Mucosa; Nasal Swab  Result Value Ref Range Status   MRSA by PCR Next Gen NOT DETECTED NOT DETECTED Final    Comment: (NOTE) The GeneXpert MRSA Assay (FDA approved for NASAL specimens only), is one component of a comprehensive MRSA colonization surveillance program. It is not intended to diagnose MRSA infection nor to guide or monitor treatment for MRSA infections. Test performance is not FDA approved in patients less than 53 years old. Performed at [facility], [address] [place], [place] [address]   Expectorated Sputum Assessment w Gram Stain, Rflx to Resp Cult     Status: None   Collection Time: 04/23/24 11:42 AM   Specimen: Urine, Suprapubic; Sputum  Result Value Ref Range Status   Specimen Description URINE, SUPRAPUBIC  Final   Special Requests NONE  Final   Sputum evaluation   Final    THIS SPECIMEN IS ACCEPTABLE FOR SPUTUM CULTURE Performed at [facility], [address] [place], [place] [address]    Report Status 04/23/2024 FINAL  Final  Culture, Respiratory w Gram Stain     Status: None   Collection Time: 04/23/24 11:42 AM   Specimen: SPU  Result Value Ref Range Status   Specimen Description   Final    SPUTUM Performed at [facility], [address] [place], [place] [address]    Special Requests   Final    NONE Reflexed from [id number] Performed at [facility], [address] [place], [place] [address]    Gram Stain   Final    FEW WBC PRESENT, PREDOMINANTLY PMN MODERATE GRAM POSITIVE COCCI Performed at [facility], [address], [place], [place]  [address]    Culture FEW STREPTOCOCCUS PNEUMONIAE  Final   Report Status 04/26/2024 FINAL  Final   Organism ID, Bacteria STREPTOCOCCUS PNEUMONIAE  Final      Susceptibility   Streptococcus pneumoniae - MIC*    ERYTHROMYCIN <=0.12 SENSITIVE Sensitive     LEVOFLOXACIN  1 SENSITIVE Sensitive     VANCOMYCIN 0.5 SENSITIVE Sensitive     PENICILLIN (meningitis) <=0.06 SENSITIVE Sensitive     PENO - penicillin <=0.06      PENICILLIN (non-meningitis) <=0.06 SENSITIVE Sensitive     PENICILLIN (oral) <=0.06 SENSITIVE Sensitive     CEFTRIAXONE  (non-meningitis) <=0.12 SENSITIVE Sensitive     CEFTRIAXONE  (meningitis) <=0.12 SENSITIVE Sensitive     * FEW STREPTOCOCCUS PNEUMONIAE     Radiology Studies: No results found.  Scheduled Meds:  allopurinol   50 mg Oral Daily   amiodarone   400 mg Oral BID   amLODipine   5 mg Oral Daily   budesonide  (PULMICORT ) nebulizer solution  0.5 mg Nebulization BID   cholestyramine  light  4 g Oral BID  gabapentin   300 mg Oral TID   levothyroxine   50 mcg Oral Q0600   lidocaine   1 patch Transdermal Q24H   liver oil-zinc  oxide   Topical BID   loperamide   2 mg Oral Q8H   methylPREDNISolone  (SOLU-MEDROL ) injection  20 mg Intravenous Daily   pantoprazole   40 mg Oral QHS   pravastatin   80 mg Oral Daily   sodium chloride  flush  3 mL Intravenous Q12H   Continuous Infusions:  cefTRIAXone  (ROCEPHIN )  IV 2 g (04/27/24 2122)     LOS: 5 days  MDM: Patient is high risk for one or more organ failure.  They necessitate ongoing hospitalization for continued IV therapies and subsequent lab monitoring. Total time spent interpreting labs and vitals, reviewing the medical record, coordinating care amongst consultants and care team members, directly assessing and discussing care with the patient and/or family: 55 min  Malacki Mcphearson, DO [facility]  To contact the attending physician between 7A-7P please use Epic Chat. To contact the covering physician during after hours  7P-7A, please review Amion.  04/28/2024, 7:19 AM   *This document has been created with the assistance of dictation software. Please excuse typographical errors. *

## 2024-04-29 DIAGNOSIS — R6520 Severe sepsis without septic shock: Secondary | ICD-10-CM | POA: Diagnosis not present

## 2024-04-29 DIAGNOSIS — A419 Sepsis, unspecified organism: Secondary | ICD-10-CM | POA: Diagnosis not present

## 2024-04-29 LAB — GLUCOSE, CAPILLARY: Glucose-Capillary: 77 mg/dL (ref 70–99)

## 2024-04-29 MED ORDER — RISAQUAD PO CAPS
2.0000 | ORAL_CAPSULE | Freq: Three times a day (TID) | ORAL | Status: DC
  Administered 2024-04-29 – 2024-05-03 (×12): 2 via ORAL
  Filled 2024-04-29 (×12): qty 2

## 2024-04-29 MED ORDER — AMIODARONE HCL 200 MG PO TABS
200.0000 mg | ORAL_TABLET | Freq: Two times a day (BID) | ORAL | Status: DC
  Administered 2024-04-29 – 2024-05-02 (×6): 200 mg via ORAL
  Filled 2024-04-29 (×6): qty 1

## 2024-04-29 NOTE — Plan of Care (Signed)
 Pt is alert oriented x 4. 3L nasal cannula. Pt is +1 assist to bedside commode. BLE edema. SCDs on. Pt c/o sore throat, prn norco given per order and prn cepacol lozenge, effective. Call button within reach. No distress noted.   Problem: Education: Goal: Knowledge of General Education information will improve Description: Including pain rating scale, medication(s)/side effects and non-pharmacologic comfort measures Outcome: Progressing   Problem: Health Behavior/Discharge Planning: Goal: Ability to manage health-related needs will improve Outcome: Progressing   Problem: Clinical Measurements: Goal: Ability to maintain clinical measurements within normal limits will improve Outcome: Progressing Goal: Will remain free from infection Outcome: Progressing Goal: Diagnostic test results will improve Outcome: Progressing Goal: Respiratory complications will improve Outcome: Progressing Goal: Cardiovascular complication will be avoided Outcome: Progressing   Problem: Activity: Goal: Risk for activity intolerance will decrease Outcome: Progressing   Problem: Nutrition: Goal: Adequate nutrition will be maintained Outcome: Progressing   Problem: Coping: Goal: Level of anxiety will decrease Outcome: Progressing   Problem: Elimination: Goal: Will not experience complications related to bowel motility Outcome: Progressing Goal: Will not experience complications related to urinary retention Outcome: Progressing   Problem: Pain Managment: Goal: General experience of comfort will improve and/or be controlled Outcome: Progressing   Problem: Safety: Goal: Ability to remain free from injury will improve Outcome: Progressing   Problem: Skin Integrity: Goal: Risk for impaired skin integrity will decrease Outcome: Progressing   Problem: Education: Goal: Knowledge of condition and prescribed therapy will improve Outcome: Progressing   Problem: Cardiac: Goal: Will achieve and/or  maintain adequate cardiac output Outcome: Progressing   Problem: Physical Regulation: Goal: Complications related to the disease process, condition or treatment will be avoided or minimized Outcome: Progressing   Problem: Activity: Goal: Ability to tolerate increased activity will improve Outcome: Progressing   Problem: Clinical Measurements: Goal: Ability to maintain a body temperature in the normal range will improve Outcome: Progressing   Problem: Respiratory: Goal: Ability to maintain adequate ventilation will improve Outcome: Progressing Goal: Ability to maintain a clear airway will improve Outcome: Progressing

## 2024-04-29 NOTE — [place] FL2 (Signed)
   MEDICAID FL2 LEVEL OF CARE FORM     IDENTIFICATION  Patient Name: Wendy [name] Birthdate: [birth year] Sex: female Admission Date (Current Location): 04/20/2024  [facility] and [place] Number:  Chiropodist and Address:  [facility], [address], [place], [place] [address]      Provider Number: [id number]  Attending Physician Name and Address:  Leesa Kast, DO  Relative Name and Phone Number:  Abbegail Matuska [phone]    Current Level of Care: SNF Recommended Level of Care: Skilled Nursing Facility Prior Approval Number:    Date Approved/Denied:   PASRR Number: [id number]  Discharge Plan: SNF    Current Diagnoses: Patient Active Problem List   Diagnosis Date Noted   Severe sepsis (HCC) 04/23/2024   Acute respiratory failure with hypoxia (HCC) 04/23/2024   Hemoptysis 04/23/2024   Rapid atrial fibrillation (HCC) 04/23/2024   Lobar pneumonia 04/23/2024   Increased anion gap metabolic acidosis 04/22/2024   Hyperlipidemia, unspecified 04/21/2024   Melena 04/20/2024   Diarrhea 04/20/2024   Hypokalemia 04/20/2024   Acute renal failure superimposed on stage 3b chronic kidney disease (HCC) 04/20/2024   Recurrent syncope 04/20/2024   Hypotension 04/20/2024   S/P parathyroidectomy 08/23/2021   Stage 3b chronic kidney disease (HCC) 08/23/2021   CAD (coronary artery disease) 08/16/2014   Hypothyroidism 09/09/2013   Hypertension 09/02/2013   Acute on chronic diastolic CHF (congestive heart failure) (HCC) 08/29/2013   hx: breast cancer, right, IDC, 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 - 07/24/2011   Obstructive sleep apnea 07/05/2010   SPOTTED FEVERS 10/22/2009   FATIGUE 10/22/2009   ARM PAIN, RIGHT 08/20/2009   CHEST PAIN UNSPECIFIED 08/20/2009   Infection due to Escherichia coli 07/19/2009   NECK PAIN, ACUTE 07/19/2009   INF MICROORG RESIST-OTH SPEC RX NO RESIST MX RX 07/19/2009   Other specified chronic obstructive pulmonary disease  (HCC) 06/18/2009   URINARY TRACT INFECTION, ACUTE, RECURRENT 06/18/2009   DYSURIA 06/18/2009   HYPERCALCEMIA 03/27/2007   CARDIOMYOPATHY, HYPERTROPHIC 03/27/2007   ASTHMATIC BRONCHITIS, ACUTE 03/27/2007   Allergic rhinitis 03/27/2007   REFLUX, ESOPHAGEAL 03/27/2007   DEGENERATIVE DISC DISEASE 03/27/2007    Orientation RESPIRATION BLADDER Height & Weight     Self, Time, Situation  Normal Continent Weight: 240 lb 8.4 oz (109.1 kg) Height:  5' 6 (167.6 cm)  BEHAVIORAL SYMPTOMS/MOOD NEUROLOGICAL BOWEL NUTRITION STATUS        Diet (Diet regular Fluid consistency: Thin: General starting at 11/21 1544)  AMBULATORY STATUS COMMUNICATION OF NEEDS Skin   Limited Assist   Normal                       Personal Care Assistance Level of Assistance  Bathing, Feeding, Dressing Bathing Assistance: Maximum assistance Feeding assistance: Maximum assistance Dressing Assistance: Maximum assistance     Functional Limitations Info  Sight, Hearing, Speech Sight Info: Adequate Hearing Info: Adequate Speech Info: Adequate    SPECIAL CARE FACTORS FREQUENCY  PT (By licensed PT), OT (By licensed OT)     PT Frequency: 5x OT Frequency: 5x            Contractures      Additional Factors Info                  Current Medications (04/29/2024):  This is the current hospital active medication list Current Facility-Administered Medications  Medication Dose Route Frequency Provider Last Rate Last Admin   acetaminophen  (TYLENOL ) tablet 650 mg  650 mg Oral  Q6H PRN Duncan, Hazel V, MD   650 mg at 04/25/24 2128   Or   acetaminophen  (TYLENOL ) suppository 650 mg  650 mg Rectal Q6H PRN Duncan, Hazel V, MD       albuterol  (PROVENTIL ) (2.5 MG/3ML) 0.083% nebulizer solution 2.5 mg  2.5 mg Nebulization Q4H PRN Duncan, Hazel V, MD   2.5 mg at 04/23/24 2011   amiodarone  (PACERONE ) tablet 400 mg  400 mg Oral BID Josette Ade, MD   400 mg at 04/29/24 1021   amLODipine  (NORVASC ) tablet 5 mg  5 mg  Oral Daily Josette Ade, MD   5 mg at 04/29/24 1021   budesonide  (PULMICORT ) nebulizer solution 0.5 mg  0.5 mg Nebulization BID Kasa, Kurian, MD   0.5 mg at 04/29/24 0736   cefTRIAXone  (ROCEPHIN ) 2 g in sodium chloride  0.9 % 100 mL IVPB  2 g Intravenous Q24H Dezii, Alexandra, DO 200 mL/hr at 04/28/24 2253 2 g at 04/28/24 2253   cholestyramine  light (PREVALITE ) packet 4 g  4 g Oral BID Josette Ade, MD   4 g at 04/29/24 1020   gabapentin  (NEURONTIN ) capsule 300 mg  300 mg Oral TID Josette Ade, MD   300 mg at 04/29/24 1021   guaiFENesin  (ROBITUSSIN) 100 MG/5ML liquid 5 mL  5 mL Oral Q4H PRN Josette Ade, MD   5 mL at 04/22/24 2123   hydrALAZINE  (APRESOLINE ) injection 10 mg  10 mg Intravenous Q6H PRN Dezii, Lorane, DO       HYDROcodone -acetaminophen  (NORCO/VICODIN) 5-325 MG per tablet 1-2 tablet  1-2 tablet Oral Q4H PRN Duncan, Hazel V, MD   2 tablet at 04/29/24 9361   labetalol  (NORMODYNE ) injection 10 mg  10 mg Intravenous Q2H PRN Dezii, Alexandra, DO       levothyroxine  (SYNTHROID ) tablet 50 mcg  50 mcg Oral Q0600 Duncan, Hazel V, MD   50 mcg at 04/29/24 9367   lidocaine  (LIDODERM ) 5 % 1 patch  1 patch Transdermal Q24H Josette Ade, MD   1 patch at 04/28/24 1617   liver oil-zinc  oxide (DESITIN) 40 % ointment   Topical BID Josette Ade, MD   Given at 04/29/24 1021   loperamide  (IMODIUM ) capsule 2 mg  2 mg Oral Q8H Dezii, Alexandra, DO   2 mg at 04/29/24 9367   menthol  (CEPACOL) lozenge 3 mg  1 lozenge Oral PRN Dezii, Alexandra, DO   3 mg at 04/28/24 2309   methylPREDNISolone  sodium succinate  (SOLU-MEDROL ) 40 mg/mL injection 20 mg  20 mg Intravenous Daily Dezii, Alexandra, DO   20 mg at 04/28/24 0853   morphine  (PF) 2 MG/ML injection 2 mg  2 mg Intravenous Q3H PRN Duncan, Hazel V, MD       nitroGLYCERIN  (NITROSTAT ) SL tablet 0.4 mg  0.4 mg Sublingual Q5 min PRN Josette Ade, MD       ondansetron  (ZOFRAN ) tablet 4 mg  4 mg Oral Q6H PRN Duncan, Hazel V, MD       Or    ondansetron  (ZOFRAN ) injection 4 mg  4 mg Intravenous Q6H PRN Duncan, Hazel V, MD   4 mg at 04/22/24 0911   pantoprazole  (PROTONIX ) EC tablet 40 mg  40 mg Oral QHS Josette Ade, MD   40 mg at 04/28/24 2236   pravastatin  (PRAVACHOL ) tablet 80 mg  80 mg Oral Daily Duncan, Hazel V, MD   80 mg at 04/28/24 1617   sodium chloride  flush (NS) 0.9 % injection 3 mL  3 mL Intravenous Q12H Cleatus Delayne GAILS, MD  3 mL at 04/28/24 2239     Discharge Medications: Please see discharge summary for a list of discharge medications.  Relevant Imaging Results:  Relevant Lab Results:   Additional Information [id number]  Alfonso Rummer, LCSW

## 2024-04-29 NOTE — Progress Notes (Signed)
 PROGRESS NOTE    Wendy [name]  FMW:[id number] DOB: [birth year] DOA: 04/20/2024 PCP: [facility]  Chief Complaint  Patient presents with   Chest Pain   Near Syncope   Diarrhea    Hospital Course:  83 y.o. 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  She denies abdominal pain or vomiting she was hypotensive to the 60s with EMS and was placed on Levophed which was discontinued soon after arrival in the ED. On arrival in the ED, BP 83/57 with otherwise normal vitals Labs notable for normal WBC, hemoglobin 11.9 (baseline 12).  CMP notable for potassium of 2.5 with creatinine of 2.02 (baseline 1.5) Lipase and LFTs WNL and magnesium normal Stool culture ordered from the ED Patient treated with IV potassium Admission requested.   11/20.  Patient having diarrhea for 3 weeks.  Stool studies sent off and negative.  EGD negative.  Prepping for colonoscopy and this evening will have a colonoscopy tomorrow 11/21. Colonoscopy negative but biosies taken 11/22.  Patient diagnosed with pneumonia overnight.  Starting rocephin  and zithromax .  Patient went into rapid afib this am.  Coughing up blood.  Placed on oxygen .  11/22 continued.  Needed to switch over to amiodarone  drip to control heart rate with blood pressure being on the lower side.  Will hold further Lasix  and give a fluid bolus.  Consulted pulmonary for coughing up blood.  With starting amiodarone  will have to change Zithromax  over to doxycycline .  With possible aspiration will change Rocephin  over to Zosyn .  Steroids started by pulmonary.  11/23.  Patient complained of chest pain this morning.  Given a fluid  bolus for low blood pressure.  Troponins trended down from yesterday.  Patient still having some diarrhea and coughing up blood.  This afternoon down to 5 L nasal cannula. 11/24.  Patient still complaining of coughing up blood.  Still having some rib pains.  Still having some diarrhea. 11/25.  Patient still coughing up a little blood still having some diarrhea.  Colon biopsies did not show any pathology.  Supportive care with cholestyramine  and as needed Imodium .  Can consider Xifaxan.  Switch antibiotics for pneumonia to Rocephin  only since Streptococcus pneumonia growing out of sputum culture.  Physical therapy recommending rehab. 11/26.  Still having diarrhea.  Schedule Imodium  11/27 much improvement in diarrhea.  Patient still very weak.  She is hesitant to discharge home today.  She is refusing SNF and home health - 11/28 diarrhea improved.  Attempted to wean patient to room air but desatting into the upper 80s.  She is now amendable to SNF.  Will start this process  Subjective: No acute events overnight. On evaluation today patient reports she is still dyspneic and has a sore throat.  She is using cough drops.  Her friends are visiting at bedside.  When discussing discharge plans patient remains very anxious upon discharge.  I again revisited skilled nursing facility and today she is amendable.  TOC made aware of change   Objective: Vitals:   04/29/24 0312 04/29/24 0447 04/29/24 0500 04/29/24 0942  BP: (!) 184/75 (!) 155/68  (!) 141/85  Pulse: 76 70  84  Resp: 20 20  (!) 22  Temp: 98.7 F (37.1 C) (!)  97.4 F (36.3 C)  98 F (36.7 C)  TempSrc:    Oral  SpO2: 97% 97%  95%  Weight:   109.1 kg   Height:        Intake/Output Summary (Last 24 hours) at 04/29/2024 1352 Last data filed at 04/29/2024 1009 Gross per 24 hour  Intake 360 ml  Output --  Net 360 ml   Filed Weights   04/27/24 0500 04/28/24 0537 04/29/24 0500  Weight: 112 kg 109.2 kg 109.1 kg    Examination: General  exam: Appears calm and comfortable, NAD  Respiratory system: No work of breathing, symmetric chest wall expansion, 2 L in place Cardiovascular system: S1 & S2 heard, RRR.  Gastrointestinal system: Abdomen is nondistended, soft and nontender.  Neuro: Alert and oriented. No focal neurological deficits. Extremities: Symmetric, expected ROM Skin: No rashes, lesions Psychiatry: Demonstrates appropriate judgement and insight. Mood & affect appropriate for situation.   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 - Stool studies negative - Colonoscopy negative, biopsies unremarkable - Continue with cholestyramine  and scheduled Imodium  which appears to be helping significantly. - Allopurinol  discontinued, watch for gout flares - Patient does have history of colon resection, extensive history of IBS, and is on daily antibiotics outpatient for recurrent UTI.  All of the above contributing to her chronic diarrhea.   Generalized deconditioning - Secondary to all of the below - PT OT recommending rehab/SNF.  I have discussed this extensively with her on multiple occasions.  She was also refusing home health services.  When preparing for discharge today she has now changed her mind and would like to pursue SNF. - I have updated TOC we will begin bed  search.    Severe sepsis (HCC) Strep pneumo pneumonia -Left upper lobe pneumonia with tachycardia, tachypnea, leukocytosis, lactic acidosis. - Strep Pneumo ag and sputum positive - Continue ceftriaxone  through 11/28.  Last dose today. - Blood cultures negative   Hemoptysis, resolved -Prior attending has discussed with pulmonology who believes this is secondary to pneumonia and anticipates it will improve with continue with treatment as above  Acute respiratory failure with hypoxia (HCC) -O2 sat 88% on room air, was on nonrebreather when diagnosed with pneumonia eventually requiring high flow nasal cannula - Requiring about 2 L.  Difficulty weaning to room air. - Anticipate this is due to significant deconditioning now.  Encourage incentive spirometry and flutter valve - Will discontinue prednisone now.  Status post 5-day course  Rapid atrial fibrillation (HCC) Now NSR - Amiodarone  drip started 11/22, switched to oral 11/23 - Tapered to 200 twice daily today. - No blood thinners secondary to hemoptysis    Hypotension resolved   Acute renal failure superimposed on stage 3b chronic kidney disease (HCC) -Prerenal secondary to GI losses resulting in hypoperfusion with ATN -  Metabolic acidosis resolved - Creatinine on presentation 2.0, has resolved to 1.2 for now - Continue to encourage p.o. intake.  Avoid additional IV fluids given CHF.  Acute on chronic diastolic CHF (congestive heart failure) (HCC) -Continue holding Lasix    Hypokalemia -Replace needed   Increased anion gap metabolic acidosis Secondary to AKI on CKD.  Improved now.  Recurrent syncope -Likely secondary to dehydration, hypotension and hypokalemia   Melena -EGD negative.     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   Hypothyroidism On levothyroxine    Obstructive sleep apnea CPAP nightly if desired   Body mass index is 38.82 kg/m. Obesity Class 2 - Outpatient follow up for lifestyle modification and risk factor management   DVT prophylaxis: SCDs for now   Code Status: Full Code Disposition: Now reports feeling ready for skilled nursing facility.  Have consulted TOC who will work on making these arrangements.  Anticipate she will be in house until this is arranged which may take a few days  Consultants:  Treatment Team:  Consulting Physician: Jinny Carmine, MD Consulting Physician: Malka Domino, MD  Procedures:    Antimicrobials:  Anti-infectives (From admission, onward)    Start     Dose/Rate Route Frequency Ordered Stop   04/25/24 2100  cefTRIAXone  (ROCEPHIN ) 2 g in sodium chloride  0.9 % 100 mL IVPB        2 g 200 mL/hr over 30 Minutes Intravenous Every 24 hours 04/25/24 1459 04/29/24 1314   04/23/24 2200  doxycycline  (VIBRAMYCIN ) 100 mg in sodium chloride  0.9 % 250 mL IVPB  Status:  Discontinued        100 mg 125 mL/hr over 120 Minutes Intravenous Every 12 hours 04/23/24 1154 04/26/24 1326   04/23/24 1245  piperacillin -tazobactam (ZOSYN ) IVPB 3.375 g        3.375 g 12.5 mL/hr over 240 Minutes Intravenous Every 8 hours 04/23/24 1148 04/25/24 1747   04/23/24 0745  cefTRIAXone  (ROCEPHIN ) 2 g in sodium chloride  0.9 % 100 mL IVPB  Status:  Discontinued        2 g 200 mL/hr over 30 Minutes Intravenous Every 24 hours 04/23/24 0654 04/23/24 1144   04/23/24 0745  azithromycin  (ZITHROMAX ) 500 mg in sodium chloride  0.9 % 250 mL IVPB  Status:  Discontinued        500 mg 250 mL/hr over 60 Minutes Intravenous Every 24 hours 04/23/24 0654 04/23/24 1154       Data Reviewed: I have  personally reviewed following labs and imaging studies CBC: Recent Labs  Lab 04/22/24 2332 04/23/24 0212 04/24/24 0846 04/25/24 0308 04/27/24 0917  WBC 15.0* 13.1* 13.9* 9.6 11.7*  NEUTROABS  --   --   --   --  9.3*  HGB 12.2 11.1* 10.8* 10.5* 12.7  HCT 36.4 33.3* 33.4* 31.7* 39.6  MCV 97.3 97.7 100.6* 99.4 99.2  PLT 209 206 218 218 321   Basic Metabolic Panel: Recent Labs  Lab 04/22/24 2332 04/23/24 0212 04/24/24 0846 04/25/24 0308 04/27/24 0917  NA 140 140 136 139 142  K 3.4* 3.6 4.7 5.0 5.0  CL 103 104 102 108 110  CO2 22 24  23 23 20*  GLUCOSE 135* 134* 112* 114* 143*  BUN 19 20 33* 43* 35*  CREATININE 1.30* 1.33* 1.61* 1.59* 1.24*  CALCIUM 8.8* 8.5* 8.7* 8.3* 9.2  MG  --  1.8  --   --   --   PHOS  --  2.6  --   --   --    GFR: Estimated Creatinine Clearance: 44.5 mL/min (A) (by C-G formula based on SCr of 1.24 mg/dL (H)). Liver Function Tests: Recent Labs  Lab 04/27/24 0917  AST 35  ALT 53*  ALKPHOS 132*  BILITOT 0.4  PROT 6.6  ALBUMIN 3.3*   CBG: Recent Labs  Lab 04/25/24 0443 04/26/24 0323 04/27/24 0525 04/28/24 0606 04/29/24 0612  GLUCAP 121* 109* 111* 77 77    Recent Results (from the past 240 hours)  Stool culture     Status: None   Collection Time: 04/20/24  9:20 PM   Specimen: Stool  Result Value Ref Range Status   Salmonella/Shigella Screen Final report  Final   Campylobacter Culture Final report  Final   E coli, Shiga toxin Assay Negative Negative Final    Comment: (NOTE) Performed At: [facility] [address] [place], [place] [id number] Jennette Shorter MD Ey:[id number]   STOOL CULTURE REFLEX - RSASHR     Status: None   Collection Time: 04/20/24  9:20 PM  Result Value Ref Range Status   Stool Culture result 1 (RSASHR) Comment  Final    Comment: (NOTE) No Salmonella or Shigella recovered. Performed At: [facility] [address] [place], [place] [id number] Jennette Shorter MD Ey:[id number]   STOOL CULTURE  Reflex - CMPCXR     Status: None   Collection Time: 04/20/24  9:20 PM  Result Value Ref Range Status   Stool Culture result 1 (CMPCXR) Comment  Final    Comment: (NOTE) No Campylobacter species isolated. Performed At: [facility] [address] [place], [place] [id number] Jennette Shorter MD Ey:[id number]   C Difficile Quick Screen w PCR reflex     Status: None   Collection Time: 04/21/24  9:00 AM   Specimen: STOOL  Result Value Ref Range Status   C Diff antigen NEGATIVE NEGATIVE Final   C Diff toxin NEGATIVE NEGATIVE Final   C Diff interpretation No C. difficile detected.  Final    Comment: Performed at [facility], [address] [place], [place] [address]  Gastrointestinal Panel by PCR , Stool     Status: None   Collection Time: 04/21/24  9:00 AM   Specimen: Stool  Result Value Ref Range Status   Campylobacter species NOT DETECTED NOT DETECTED Final   Plesimonas shigelloides NOT DETECTED NOT DETECTED Final   Salmonella species NOT DETECTED NOT DETECTED Final   Yersinia enterocolitica NOT DETECTED NOT DETECTED Final   Vibrio species NOT DETECTED NOT DETECTED Final   Vibrio cholerae NOT DETECTED NOT DETECTED Final   Enteroaggregative E coli (EAEC) NOT DETECTED NOT DETECTED Final   Enteropathogenic E coli (EPEC) NOT DETECTED NOT DETECTED Final   Enterotoxigenic E coli (ETEC) NOT DETECTED NOT DETECTED Final   Shiga like toxin producing E coli (STEC) NOT DETECTED NOT DETECTED Final   Shigella/Enteroinvasive E coli (EIEC) NOT DETECTED NOT DETECTED Final   Cryptosporidium NOT DETECTED NOT DETECTED Final   Cyclospora cayetanensis NOT DETECTED NOT DETECTED Final   Entamoeba histolytica NOT DETECTED NOT DETECTED Final   Giardia lamblia NOT DETECTED NOT DETECTED Final   Adenovirus F40/41 NOT DETECTED NOT DETECTED  Final   Astrovirus NOT DETECTED NOT DETECTED Final   Norovirus GI/GII NOT DETECTED NOT DETECTED Final   Rotavirus A NOT DETECTED NOT DETECTED Final    Sapovirus (I, II, IV, and V) NOT DETECTED NOT DETECTED Final    Comment: Performed at [facility], [address] [place], [place] [address]  Culture, blood (routine x 2) Call MD if unable to obtain prior to antibiotics being given     Status: None   Collection Time: 04/23/24  7:50 AM   Specimen: BLOOD  Result Value Ref Range Status   Specimen Description BLOOD BLOOD LEFT HAND  Final   Special Requests   Final    BOTTLES DRAWN AEROBIC AND ANAEROBIC Blood Culture results may not be optimal due to an excessive volume of blood received in culture bottles   Culture   Final    NO GROWTH 5 DAYS Performed at [facility], [address] [place], [place] [address]    Report Status 04/28/2024 FINAL  Final  Resp panel by RT-PCR (RSV, Flu A&B, Covid) Anterior Nasal Swab     Status: None   Collection Time: 04/23/24  9:20 AM   Specimen: Anterior Nasal Swab  Result Value Ref Range Status   SARS Coronavirus 2 by RT PCR NEGATIVE NEGATIVE Final    Comment: (NOTE) SARS-CoV-2 target nucleic acids are NOT DETECTED.  The SARS-CoV-2 RNA is generally detectable in upper respiratory specimens during the acute phase of infection. The lowest concentration of SARS-CoV-2 viral copies this assay can detect is 138 copies/mL. A negative result does not preclude SARS-Cov-2 infection and should not be used as the sole basis for treatment or other patient management decisions. A negative result may occur with  improper specimen collection/handling, submission of specimen other than nasopharyngeal swab, presence of viral mutation(s) within the areas targeted by this assay, and inadequate number of viral copies(<138 copies/mL). A negative result must be combined with clinical observations, patient history, and epidemiological information. The expected result is Negative.  Fact Sheet for Patients:  bloggercourse.com  Fact Sheet for Healthcare Providers:   seriousbroker.it  This test is no t yet approved or cleared by the [place]  FDA and  has been authorized for detection and/or diagnosis of SARS-CoV-2 by FDA under an Emergency Use Authorization (EUA). This EUA will remain  in effect (meaning this test can be used) for the duration of the COVID-19 declaration under Section 564(b)(1) of the Act, 21 U.S.C.section 360bbb-3(b)(1), unless the authorization is terminated  or revoked sooner.       Influenza A by PCR NEGATIVE NEGATIVE Final   Influenza B by PCR NEGATIVE NEGATIVE Final    Comment: (NOTE) The Xpert Xpress SARS-CoV-2/FLU/RSV plus assay is intended as an aid in the diagnosis of influenza from Nasopharyngeal swab specimens and should not be used as a sole basis for treatment. Nasal washings and aspirates are unacceptable for Xpert Xpress SARS-CoV-2/FLU/RSV testing.  Fact Sheet for Patients: bloggercourse.com  Fact Sheet for Healthcare Providers: seriousbroker.it  This test is not yet approved or cleared by the [place]  FDA and has been authorized for detection and/or diagnosis of SARS-CoV-2 by FDA under an Emergency Use Authorization (EUA). This EUA will remain in effect (meaning this test can be used) for the duration of the COVID-19 declaration under Section 564(b)(1) of the Act, 21 U.S.C. section 360bbb-3(b)(1), unless the authorization is terminated or revoked.     Resp Syncytial Virus by PCR NEGATIVE NEGATIVE Final    Comment: (NOTE)  Fact Sheet for Patients: bloggercourse.com  Fact Sheet for Healthcare Providers: seriousbroker.it  This test is not yet approved or cleared by the [place]  FDA and has been authorized for detection and/or diagnosis of SARS-CoV-2 by FDA under an Emergency Use Authorization (EUA). This EUA will remain in effect (meaning this test can be used) for  the duration of the COVID-19 declaration under Section 564(b)(1) of the Act, 21 U.S.C. section 360bbb-3(b)(1), unless the authorization is terminated or revoked.  Performed at [facility], [address] [place], [place] [address]   Culture, blood (Routine X 2) w Reflex to ID Panel     Status: None   Collection Time: 04/23/24 10:39 AM   Specimen: BLOOD  Result Value Ref Range Status   Specimen Description BLOOD BLOOD LEFT HAND  Final   Special Requests   Final    BOTTLES DRAWN AEROBIC AND ANAEROBIC Blood Culture adequate volume   Culture   Final    NO GROWTH 5 DAYS Performed at [facility], [address] [place], [place] [address]    Report Status 04/28/2024 FINAL  Final  MRSA Next Gen by PCR, Nasal     Status: None   Collection Time: 04/23/24 11:42 AM   Specimen: Nasal Mucosa; Nasal Swab  Result Value Ref Range Status   MRSA by PCR Next Gen NOT DETECTED NOT DETECTED Final    Comment: (NOTE) The GeneXpert MRSA Assay (FDA approved for NASAL specimens only), is one component of a comprehensive MRSA colonization surveillance program. It is not intended to diagnose MRSA infection nor to guide or monitor treatment for MRSA infections. Test performance is not FDA approved in patients less than 12 years old. Performed at [facility], [address] [place], [place] [address]   Expectorated Sputum Assessment w Gram Stain, Rflx to Resp Cult     Status: None   Collection Time: 04/23/24 11:42 AM   Specimen: Urine, Suprapubic; Sputum  Result Value Ref Range Status   Specimen Description URINE, SUPRAPUBIC  Final   Special Requests NONE  Final   Sputum evaluation   Final    THIS SPECIMEN IS ACCEPTABLE FOR SPUTUM CULTURE Performed at [facility], [address] [place], [place] [address]    Report Status 04/23/2024 FINAL  Final  Culture, Respiratory w Gram Stain     Status: None   Collection Time: 04/23/24 11:42 AM   Specimen: SPU   Result Value Ref Range Status   Specimen Description   Final    SPUTUM Performed at [facility], [address], [place], [place] [address]    Special Requests   Final    NONE Reflexed from [id number] Performed at [facility], [address] [place], [place] [address]    Gram Stain   Final    FEW WBC PRESENT, PREDOMINANTLY PMN MODERATE GRAM POSITIVE COCCI Performed at [facility], [address], [place], [place] [address]    Culture FEW STREPTOCOCCUS PNEUMONIAE  Final   Report Status 04/26/2024 FINAL  Final   Organism ID, Bacteria STREPTOCOCCUS PNEUMONIAE  Final      Susceptibility   Streptococcus pneumoniae - MIC*    ERYTHROMYCIN <=0.12 SENSITIVE Sensitive     LEVOFLOXACIN  1 SENSITIVE Sensitive     VANCOMYCIN 0.5 SENSITIVE Sensitive     PENICILLIN (meningitis) <=0.06 SENSITIVE Sensitive     PENO - penicillin <=0.06      PENICILLIN (non-meningitis) <=0.06 SENSITIVE Sensitive     PENICILLIN (  oral) <=0.06 SENSITIVE Sensitive     CEFTRIAXONE  (non-meningitis) <=0.12 SENSITIVE Sensitive     CEFTRIAXONE  (meningitis) <=0.12 SENSITIVE Sensitive     * FEW STREPTOCOCCUS PNEUMONIAE     Radiology Studies: No results found.  Scheduled Meds:  amiodarone   400 mg Oral BID   amLODipine   5 mg Oral Daily   budesonide  (PULMICORT ) nebulizer solution  0.5 mg Nebulization BID   cholestyramine  light  4 g Oral BID   gabapentin   300 mg Oral TID   levothyroxine   50 mcg Oral Q0600   lidocaine   1 patch Transdermal Q24H   liver oil-zinc  oxide   Topical BID   loperamide   2 mg Oral Q8H   methylPREDNISolone  (SOLU-MEDROL ) injection  20 mg Intravenous Daily   pantoprazole   40 mg Oral QHS   pravastatin   80 mg Oral Daily   sodium chloride  flush  3 mL Intravenous Q12H   Continuous Infusions:     LOS: 6 days  MDM: Patient is high risk for one or more organ failure.  They necessitate ongoing hospitalization for continued IV therapies and subsequent lab monitoring. Total time  spent interpreting labs and vitals, reviewing the medical record, coordinating care amongst consultants and care team members, directly assessing and discussing care with the patient and/or family: 55 min  Jerilynn Feldmeier, DO [facility]  To contact the attending physician between 7A-7P please use Epic Chat. To contact the covering physician during after hours 7P-7A, please review Amion.  04/29/2024, 1:52 PM   *This document has been created with the assistance of dictation software. Please excuse typographical errors. *

## 2024-04-30 DIAGNOSIS — R6520 Severe sepsis without septic shock: Secondary | ICD-10-CM | POA: Diagnosis not present

## 2024-04-30 DIAGNOSIS — A419 Sepsis, unspecified organism: Secondary | ICD-10-CM | POA: Diagnosis not present

## 2024-04-30 LAB — GLUCOSE, CAPILLARY
Glucose-Capillary: 80 mg/dL (ref 70–99)
Glucose-Capillary: 112 mg/dL — ABNORMAL HIGH (ref 70–99)

## 2024-04-30 MED ORDER — AMLODIPINE BESYLATE 10 MG PO TABS
10.0000 mg | ORAL_TABLET | Freq: Every day | ORAL | Status: DC
  Administered 2024-05-01 – 2024-05-02 (×2): 10 mg via ORAL
  Filled 2024-04-30 (×2): qty 1

## 2024-04-30 NOTE — Plan of Care (Signed)
 Pt is alert oriented x 4. 3L nasal cannula. Pt c/o throat pain, prn norco given per order. Pt ambulated in room, used bedside commode. Pt had 2 bowel movements. Pt BP increased greater than 180 systolic, prn hydralazine  given per order with effective results. BP decreased to 166/66. Pt has SCDs on. CPAP on. Call button within reach. No distress noted.   Problem: Education: Goal: Knowledge of General Education information will improve Description: Including pain rating scale, medication(s)/side effects and non-pharmacologic comfort measures Outcome: Progressing   Problem: Health Behavior/Discharge Planning: Goal: Ability to manage health-related needs will improve Outcome: Progressing   Problem: Clinical Measurements: Goal: Ability to maintain clinical measurements within normal limits will improve Outcome: Progressing Goal: Will remain free from infection Outcome: Progressing Goal: Diagnostic test results will improve Outcome: Progressing Goal: Respiratory complications will improve Outcome: Progressing Goal: Cardiovascular complication will be avoided Outcome: Progressing   Problem: Activity: Goal: Risk for activity intolerance will decrease Outcome: Progressing   Problem: Nutrition: Goal: Adequate nutrition will be maintained Outcome: Progressing   Problem: Coping: Goal: Level of anxiety will decrease Outcome: Progressing   Problem: Elimination: Goal: Will not experience complications related to bowel motility Outcome: Progressing Goal: Will not experience complications related to urinary retention Outcome: Progressing   Problem: Pain Managment: Goal: General experience of comfort will improve and/or be controlled Outcome: Progressing   Problem: Safety: Goal: Ability to remain free from injury will improve Outcome: Progressing   Problem: Skin Integrity: Goal: Risk for impaired skin integrity will decrease Outcome: Progressing   Problem: Education: Goal:  Knowledge of condition and prescribed therapy will improve Outcome: Progressing   Problem: Cardiac: Goal: Will achieve and/or maintain adequate cardiac output Outcome: Progressing   Problem: Physical Regulation: Goal: Complications related to the disease process, condition or treatment will be avoided or minimized Outcome: Progressing   Problem: Activity: Goal: Ability to tolerate increased activity will improve Outcome: Progressing   Problem: Clinical Measurements: Goal: Ability to maintain a body temperature in the normal range will improve Outcome: Progressing   Problem: Respiratory: Goal: Ability to maintain adequate ventilation will improve Outcome: Progressing Goal: Ability to maintain a clear airway will improve Outcome: Progressing

## 2024-04-30 NOTE — Progress Notes (Signed)
 PROGRESS NOTE    Wendy [name]  FMW:[id number] DOB: 1940/10/15 DOA: 04/20/2024 PCP: [facility]  Chief Complaint  Patient presents with   Chest Pain   Near Syncope   Diarrhea    Hospital Course:  83 y.o. 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  She denies abdominal pain or vomiting she was hypotensive to the 60s with EMS and was placed on Levophed which was discontinued soon after arrival in the ED. On arrival in the ED, BP 83/57 with otherwise normal vitals Labs notable for normal WBC, hemoglobin 11.9 (baseline 12).  CMP notable for potassium of 2.5 with creatinine of 2.02 (baseline 1.5) Lipase and LFTs WNL and magnesium normal Stool culture ordered from the ED Patient treated with IV potassium Admission requested.   11/20.  Patient having diarrhea for 3 weeks.  Stool studies sent off and negative.  EGD negative.  Prepping for colonoscopy and this evening will have a colonoscopy tomorrow 11/21. Colonoscopy negative but biosies taken 11/22.  Patient diagnosed with pneumonia overnight.  Starting rocephin  and zithromax .  Patient went into rapid afib this am.  Coughing up blood.  Placed on oxygen .  11/22 continued.  Needed to switch over to amiodarone  drip to control heart rate with blood pressure being on the lower side.  Will hold further Lasix  and give a fluid bolus.  Consulted pulmonary for coughing up blood.  With starting amiodarone  will have to change Zithromax  over to doxycycline .  With possible aspiration will change Rocephin  over to Zosyn .  Steroids started by pulmonary.  11/23.  Patient complained of chest pain this morning.  Given a fluid  bolus for low blood pressure.  Troponins trended down from yesterday.  Patient still having some diarrhea and coughing up blood.  This afternoon down to 5 L nasal cannula. 11/24.  Patient still complaining of coughing up blood.  Still having some rib pains.  Still having some diarrhea. 11/25.  Patient still coughing up a little blood still having some diarrhea.  Colon biopsies did not show any pathology.  Supportive care with cholestyramine  and as needed Imodium .  Can consider Xifaxan.  Switch antibiotics for pneumonia to Rocephin  only since Streptococcus pneumonia growing out of sputum culture.  Physical therapy recommending rehab. 11/26.  Still having diarrhea.  Schedule Imodium  11/27 much improvement in diarrhea.  Patient still very weak.  She is hesitant to discharge home today.  She is refusing SNF and home health 11/28 diarrhea improved.  Attempted to wean patient to room air but desatting into the upper 80s.  She is now amendable to SNF.  Will start this process - 11/29 no acute events overnight pending SNF placement  Subjective: No acute events overnight.  On evaluation today she is still complaining of sore throat.  Reports her cough drops are helping some.  I have advised tea and honey. She is also concerned about the side effect profile of amiodarone .  We discussed this extensively.  I advised her that we are tapering to the lowest dose possible.  Objective: Vitals:   04/30/24 0500 04/30/24 0534 04/30/24 0802 04/30/24 1300  BP:  (!) 166/66 (!) 163/66 (!) 131/57  Pulse:  69 72 77  Resp:    20  Temp:   (!) 97.5 F (36.4 C) 98 F (36.7 C)  TempSrc:   Oral Oral  SpO2:   100% 98%  Weight: 108.8 kg     Height:        Intake/Output Summary (Last 24 hours) at 04/30/2024 1457 Last data filed at 04/30/2024 1408 Gross per 24 hour  Intake 600 ml  Output --  Net 600 ml   Filed Weights   04/28/24 0537 04/29/24 0500 04/30/24 0500  Weight: 109.2 kg 109.1 kg 108.8 kg     Examination: General exam: Appears calm and comfortable, NAD  Respiratory system: No work of breathing, symmetric chest wall expansion, 2 L in place Cardiovascular system: S1 & S2 heard, RRR.  Gastrointestinal system: Abdomen is nondistended, soft and nontender.  Neuro: Alert and oriented. No focal neurological deficits. Extremities: Symmetric, expected ROM Skin: No rashes, lesions Psychiatry: Demonstrates appropriate judgement and insight. Mood & affect appropriate for situation.   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 - Stool studies negative - Colonoscopy negative, biopsies unremarkable - Continue with cholestyramine  and scheduled Imodium  which appears to be helping significantly. - Allopurinol  discontinued, watch for gout flares - Patient does have history of colon resection, extensive history of IBS, and is on daily antibiotics outpatient for recurrent UTI.  All of the above contributing to her chronic diarrhea.   Generalized deconditioning - Secondary to all of the below - PT/OT recommending SNF.  Initially she refused.  She is now amendable.  TOC consulted and we are awaiting updates.  Severe sepsis (HCC) Strep pneumo pneumonia -Left upper lobe pneumonia with tachycardia, tachypnea, leukocytosis, lactic acidosis. -  Strep Pneumo ag and sputum positive - Continue ceftriaxone  through 11/28.  - Blood cultures negative   Hemoptysis, resolved -Prior attending has discussed with pulmonology who believes this is secondary to pneumonia and anticipates it will improve with continue with treatment as above  Acute respiratory failure with hypoxia (HCC) -O2 sat 88% on room air, was on nonrebreather when diagnosed with pneumonia eventually requiring high flow nasal cannula -Required about 2 L, difficulty weaning to room air.  - Anticipate this is due to significant deconditioning now.  Encourage incentive spirometry and flutter valve -Status post 5-day prednisone course.  Rapid atrial fibrillation (HCC) Now NSR - Amiodarone  drip started 11/22, switched to oral 11/23 - Continue amiodarone  taper.  200 mg twice daily starting 11/28 - No blood thinners secondary to hemoptysis    Hypotension resolved   Acute renal failure superimposed on stage 3b chronic kidney disease (HCC) -Prerenal secondary to GI losses resulting in hypoperfusion with ATN - Metabolic acidosis resolved - Creatinine on presentation 2.0, has resolved to 1.2 for now - Continue to encourage p.o. intake.  Avoid additional IV fluids given CHF.  Acute on chronic diastolic CHF (congestive  heart failure) (HCC) -Continue holding Lasix    Hypokalemia -Replace needed   Increased anion gap metabolic acidosis Secondary to AKI on CKD.  Improved now.  Recurrent syncope -Likely secondary to dehydration, hypotension and hypokalemia   Melena -EGD negative.     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   Hypothyroidism On levothyroxine    Obstructive sleep apnea CPAP nightly if desired   Body mass index is 38.71 kg/m. Obesity Class 2 - Outpatient follow up for lifestyle modification and risk factor management   DVT prophylaxis: SCDs for now   Code Status: Full Code Disposition: Now reports feeling ready for skilled nursing facility.  Have consulted TOC who will work on making these arrangements.  Anticipate she will be in house until this is arranged which may take a few days  Consultants:  Treatment Team:  Consulting Physician: Jinny Carmine, MD Consulting Physician: Malka Domino, MD  Procedures:    Antimicrobials:  Anti-infectives (From admission, onward)    Start     Dose/Rate Route Frequency Ordered Stop   04/25/24 2100  cefTRIAXone  (ROCEPHIN ) 2 g in sodium chloride  0.9 % 100 mL IVPB        2 g 200 mL/hr over 30 Minutes Intravenous Every 24 hours 04/25/24 1459 04/29/24 1710   04/23/24 2200  doxycycline  (VIBRAMYCIN ) 100 mg in sodium chloride  0.9 % 250 mL IVPB  Status:  Discontinued        100 mg 125 mL/hr over 120 Minutes Intravenous Every 12 hours 04/23/24 1154 04/26/24 1326   04/23/24 1245  piperacillin -tazobactam (ZOSYN ) IVPB 3.375 g        3.375 g 12.5 mL/hr over 240 Minutes Intravenous Every 8 hours 04/23/24 1148 04/25/24 1747   04/23/24 0745  cefTRIAXone  (ROCEPHIN ) 2 g in sodium chloride  0.9 % 100 mL IVPB  Status:  Discontinued        2 g 200 mL/hr over 30 Minutes Intravenous Every 24 hours 04/23/24 0654 04/23/24 1144   04/23/24 0745  azithromycin  (ZITHROMAX ) 500 mg in sodium chloride  0.9 % 250 mL IVPB  Status:  Discontinued        500 mg 250 mL/hr over 60 Minutes Intravenous Every 24 hours 04/23/24 0654 04/23/24 1154       Data Reviewed: I have personally reviewed following labs and imaging studies CBC: Recent Labs  Lab 04/24/24 0846 04/25/24 0308 04/27/24 0917  WBC 13.9* 9.6 11.7*  NEUTROABS  --    --  9.3*  HGB 10.8* 10.5* 12.7  HCT 33.4* 31.7* 39.6  MCV 100.6* 99.4 99.2  PLT 218 218 321   Basic Metabolic Panel: Recent Labs  Lab 04/24/24 0846 04/25/24 0308 04/27/24 0917  NA 136 139 142  K 4.7 5.0 5.0  CL 102 108 110  CO2 23 23 20*  GLUCOSE 112* 114* 143*  BUN 33* 43* 35*  CREATININE 1.61* 1.59* 1.24*  CALCIUM 8.7* 8.3* 9.2   GFR: Estimated Creatinine Clearance: 44.4 mL/min (A) (by C-G formula based on SCr of 1.24 mg/dL (H)). Liver Function Tests: Recent Labs  Lab 04/27/24 0917  AST 35  ALT 53*  ALKPHOS 132*  BILITOT 0.4  PROT 6.6  ALBUMIN 3.3*   CBG: Recent Labs  Lab 04/26/24 0323 04/27/24 0525 04/28/24 0606 04/29/24 0612 04/30/24 0457  GLUCAP 109* 111* 77 77 80    Recent Results (from the past 240 hours)  Stool culture     Status: None   Collection Time: 04/20/24  9:20 PM   Specimen: Stool  Result Value Ref Range Status   Salmonella/Shigella Screen Final report  Final   Campylobacter Culture Final report  Final   E coli, Shiga toxin Assay Negative Negative Final    Comment: (NOTE) Performed At: [facility] [address] [place], [place] [id number] Jennette Shorter MD Ey:[id number]   STOOL CULTURE REFLEX - RSASHR     Status: None   Collection Time: 04/20/24  9:20 PM  Result Value Ref Range Status   Stool Culture result 1 (RSASHR) Comment  Final    Comment: (NOTE) No Salmonella or Shigella recovered. Performed At: [facility] [address] [place], [place] [id number] Jennette Shorter MD Ey:[id number]   STOOL CULTURE Reflex - CMPCXR     Status: None   Collection Time: 04/20/24  9:20 PM  Result Value Ref Range Status   Stool Culture result 1 (CMPCXR) Comment  Final    Comment: (NOTE) No Campylobacter species isolated. Performed At: [facility] [address] [place], [place] [id number] Jennette Shorter MD Ey:[id number]   C Difficile Quick Screen w PCR reflex     Status: None   Collection Time: 04/21/24  9:00  AM   Specimen: STOOL  Result Value Ref Range Status   C Diff antigen NEGATIVE NEGATIVE Final   C Diff toxin NEGATIVE NEGATIVE Final   C Diff interpretation No C. difficile detected.  Final    Comment: Performed at [facility], [address] [place], [place] [address]  Gastrointestinal Panel by PCR , Stool     Status: None   Collection Time: 04/21/24  9:00 AM   Specimen: Stool  Result Value Ref Range Status   Campylobacter species NOT DETECTED NOT DETECTED Final   Plesimonas shigelloides NOT DETECTED NOT DETECTED Final   Salmonella species NOT DETECTED NOT DETECTED Final   Yersinia enterocolitica NOT DETECTED NOT DETECTED Final   Vibrio species NOT DETECTED NOT DETECTED Final   Vibrio cholerae NOT DETECTED NOT DETECTED Final   Enteroaggregative E coli (EAEC) NOT DETECTED NOT DETECTED Final   Enteropathogenic E coli (EPEC) NOT DETECTED NOT DETECTED Final   Enterotoxigenic E coli (ETEC) NOT DETECTED NOT DETECTED Final   Shiga like toxin producing E coli (STEC) NOT DETECTED NOT DETECTED Final   Shigella/Enteroinvasive E coli (EIEC) NOT DETECTED NOT DETECTED Final   Cryptosporidium NOT DETECTED NOT DETECTED Final   Cyclospora cayetanensis NOT DETECTED NOT DETECTED Final   Entamoeba histolytica NOT DETECTED NOT DETECTED Final   Giardia lamblia NOT DETECTED NOT DETECTED Final   Adenovirus F40/41 NOT DETECTED NOT DETECTED Final   Astrovirus NOT DETECTED NOT DETECTED Final   Norovirus GI/GII NOT DETECTED NOT DETECTED Final   Rotavirus A NOT DETECTED NOT DETECTED Final   Sapovirus (I, II, IV, and V) NOT DETECTED NOT DETECTED Final    Comment: Performed at [facility], [address] [place], [place] [address]  Culture, blood (routine x 2) Call MD if unable to obtain prior to antibiotics being given     Status: None   Collection Time: 04/23/24  7:50 AM   Specimen: BLOOD  Result Value Ref Range Status   Specimen Description BLOOD BLOOD LEFT HAND  Final   Special  Requests   Final    BOTTLES DRAWN AEROBIC AND ANAEROBIC Blood Culture results may not be optimal due to an excessive volume of blood received in culture bottles   Culture   Final    NO GROWTH 5 DAYS Performed at [facility], [address] [place], [place] [address]    Report Status 04/28/2024 FINAL  Final  Resp panel by RT-PCR (RSV, Flu A&B, Covid) Anterior Nasal Swab     Status: None   Collection Time: 04/23/24  9:20 AM   Specimen: Anterior Nasal Swab  Result Value Ref Range Status   SARS Coronavirus 2 by RT PCR NEGATIVE NEGATIVE Final    Comment: (NOTE) SARS-CoV-2 target nucleic acids are NOT DETECTED.  The SARS-CoV-2 RNA is generally detectable in upper respiratory specimens during the acute phase of infection. The lowest concentration of SARS-CoV-2 viral copies this assay can detect is 138 copies/mL. A negative result does not preclude SARS-Cov-2 infection and should not be used as the sole basis for treatment or other patient management decisions. A negative result may occur with  improper specimen collection/handling, submission of specimen other than nasopharyngeal swab, presence of viral mutation(s) within the areas targeted by this assay, and inadequate number of viral copies(<138 copies/mL). A negative result must be combined with clinical observations, patient history, and epidemiological information. The expected result is Negative.  Fact Sheet for Patients:  bloggercourse.com  Fact Sheet for Healthcare Providers:  seriousbroker.it  This test is no t yet approved or cleared by the [place]  FDA and  has been authorized for detection and/or diagnosis of SARS-CoV-2 by FDA under an Emergency Use Authorization (EUA). This EUA will remain  in effect (meaning this test can be used) for the duration of the COVID-19 declaration under Section 564(b)(1) of the Act, 21 U.S.C.section 360bbb-3(b)(1), unless the  authorization is terminated  or revoked sooner.       Influenza A by PCR NEGATIVE NEGATIVE Final   Influenza B by PCR NEGATIVE NEGATIVE Final    Comment: (NOTE) The Xpert Xpress SARS-CoV-2/FLU/RSV plus assay is intended as an aid in the diagnosis of influenza from Nasopharyngeal swab specimens and should not be used as a sole basis for treatment. Nasal washings and aspirates are unacceptable for Xpert Xpress SARS-CoV-2/FLU/RSV testing.  Fact Sheet for Patients: bloggercourse.com  Fact Sheet for Healthcare Providers: seriousbroker.it  This test is not yet approved or cleared by the [place]  FDA and has been authorized for detection and/or diagnosis of SARS-CoV-2 by FDA under an Emergency Use Authorization (EUA). This EUA will remain in effect (meaning this test can be used) for the duration of the COVID-19 declaration under Section 564(b)(1) of the Act, 21 U.S.C. section 360bbb-3(b)(1), unless the authorization is terminated or revoked.     Resp Syncytial Virus by PCR NEGATIVE NEGATIVE Final    Comment: (NOTE) Fact Sheet for Patients: bloggercourse.com  Fact Sheet for Healthcare Providers: seriousbroker.it  This test is not yet approved or cleared by the [place]  FDA and has been authorized for detection and/or diagnosis of SARS-CoV-2 by FDA under an Emergency Use Authorization (EUA). This EUA will remain in effect (meaning this test can be used) for the duration of the COVID-19 declaration under Section 564(b)(1) of the Act, 21 U.S.C. section 360bbb-3(b)(1), unless the authorization is terminated or revoked.  Performed at [facility], [address] [place], [place] [address]   Culture, blood (Routine X 2) w Reflex to ID Panel  Status: None   Collection Time: 04/23/24 10:39 AM   Specimen: BLOOD  Result Value Ref Range Status   Specimen  Description BLOOD BLOOD LEFT HAND  Final   Special Requests   Final    BOTTLES DRAWN AEROBIC AND ANAEROBIC Blood Culture adequate volume   Culture   Final    NO GROWTH 5 DAYS Performed at [facility], [address] [place], [place] [address]    Report Status 04/28/2024 FINAL  Final  MRSA Next Gen by PCR, Nasal     Status: None   Collection Time: 04/23/24 11:42 AM   Specimen: Nasal Mucosa; Nasal Swab  Result Value Ref Range Status   MRSA by PCR Next Gen NOT DETECTED NOT DETECTED Final    Comment: (NOTE) The GeneXpert MRSA Assay (FDA approved for NASAL specimens only), is one component of a comprehensive MRSA colonization surveillance program. It is not intended to diagnose MRSA infection nor to guide or monitor treatment for MRSA infections. Test performance is not FDA approved in patients less than 81 years old. Performed at [facility], [address] [place], [place] [address]   Expectorated Sputum Assessment w Gram Stain, Rflx to Resp Cult     Status: None   Collection Time: 04/23/24 11:42 AM   Specimen: Urine, Suprapubic; Sputum  Result Value Ref Range Status   Specimen Description URINE, SUPRAPUBIC  Final   Special Requests NONE  Final   Sputum evaluation   Final    THIS SPECIMEN IS ACCEPTABLE FOR SPUTUM CULTURE Performed at [facility], [address], [place], [place] [address]    Report Status 04/23/2024 FINAL  Final  Culture, Respiratory w Gram Stain     Status: None   Collection Time: 04/23/24 11:42 AM   Specimen: SPU  Result Value Ref Range Status   Specimen Description   Final    SPUTUM Performed at [facility], [address], [place], [place] [address]    Special Requests   Final    NONE Reflexed from [id number] Performed at [facility], [address] [place], [place] [address]    Gram Stain   Final    FEW WBC PRESENT, PREDOMINANTLY PMN MODERATE GRAM POSITIVE COCCI Performed at [facility], [address], [place], [place] [address]    Culture FEW STREPTOCOCCUS PNEUMONIAE  Final   Report Status 04/26/2024 FINAL  Final   Organism ID, Bacteria STREPTOCOCCUS PNEUMONIAE  Final      Susceptibility   Streptococcus pneumoniae - MIC*    ERYTHROMYCIN <=0.12 SENSITIVE Sensitive     LEVOFLOXACIN  1 SENSITIVE Sensitive     VANCOMYCIN 0.5 SENSITIVE Sensitive     PENICILLIN (meningitis) <=0.06 SENSITIVE Sensitive     PENO - penicillin <=0.06      PENICILLIN (non-meningitis) <=0.06 SENSITIVE Sensitive     PENICILLIN (oral) <=0.06 SENSITIVE Sensitive     CEFTRIAXONE  (non-meningitis) <=0.12 SENSITIVE Sensitive     CEFTRIAXONE  (meningitis) <=0.12 SENSITIVE Sensitive     * FEW STREPTOCOCCUS PNEUMONIAE     Radiology Studies: No results found.  Scheduled Meds:  acidophilus  2 capsule Oral TID   amiodarone   200 mg Oral BID   [START ON 05/01/2024] amLODipine   10 mg Oral Daily   budesonide  (PULMICORT ) nebulizer solution  0.5 mg Nebulization BID   cholestyramine  light  4 g Oral BID   gabapentin   300 mg Oral TID   levothyroxine   50 mcg Oral Q0600   lidocaine   1  patch Transdermal Q24H   liver oil-zinc  oxide   Topical BID   loperamide   2 mg Oral Q8H   pantoprazole   40 mg Oral QHS   pravastatin   80 mg Oral Daily   Continuous Infusions:     LOS: 7 days  MDM: Patient is high risk for one or more organ failure.  They necessitate ongoing hospitalization for continued IV therapies and subsequent lab monitoring. Total time spent interpreting labs and vitals, reviewing the medical record, coordinating care amongst consultants and care team members, directly assessing and discussing care with the patient and/or family: 55 min  Gitty Osterlund, DO [facility]  To contact the attending physician between 7A-7P please use Epic Chat. To contact the covering physician during after hours 7P-7A, please review Amion.  04/30/2024, 2:57 PM   *This document has been created with the assistance of  dictation software. Please excuse typographical errors. *

## 2024-04-30 NOTE — Progress Notes (Signed)
 Physical Therapy Treatment Patient Details Name: Wendy [name] MRN: [id number] DOB: [birth year] Today's Date: 04/30/2024   History of Present Illness 83 y.o. 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    PT Comments  Patient seen for PT session focused on OOB mobilit. Patient required min/CGA with RW for ambulation distance of 30' feet and used RW. Tolerated session well with mild signs of exertion. Vitals remained stable during activity. Main limiting factors today were fatigue. Interventions aimed at improving functional mobility. Patient shows good potential to make progress with continued acute level rehab. Patient continues to demonstrate moderate activity restrictions and poor tolerance for progressive mobility. Continued skilled PT recommended to progress toward functional goals and support discharge readiness. Pt making good progress toward goals, will continue to follow POC. Discharge recommendation remains appropriate     If plan is discharge home, recommend the following: A little help with walking and/or transfers;A little help with bathing/dressing/bathroom;Assistance with cooking/housework;Assist for transportation;Help with stairs or ramp for entrance   Can travel by private vehicle     Yes  Equipment Recommendations  Rolling walker (2 wheels);BSC/3in1    Recommendations for Other Services       Precautions / Restrictions Precautions Precautions: Fall Recall of Precautions/Restrictions: Intact Restrictions Weight Bearing Restrictions Per Provider Order: No     Mobility  Bed Mobility                    Transfers Overall transfer level: Needs assistance Equipment used: Rolling walker (2 wheels) Transfers: Sit  to/from Stand Sit to Stand: Supervision, Contact guard assist           General transfer comment: sit<>stand from recliner with supervision    Ambulation/Gait Ambulation/Gait assistance: Contact guard assist, Min assist Gait Distance (Feet): 30 Feet Assistive device: Rolling walker (2 wheels) Gait Pattern/deviations: Decreased step length - left, Decreased step length - right, Decreased stride length Gait velocity: decreased         Stairs             Wheelchair Mobility     Tilt Bed    Modified Rankin (Stroke Patients Only)       Balance Overall balance assessment: Needs assistance Sitting-balance support: No upper extremity supported, Feet supported Sitting balance-Leahy Scale: Good     Standing balance support: During functional activity, No upper extremity supported Standing balance-Leahy Scale: Poor                              Communication Communication Communication: No apparent difficulties  Cognition Arousal: Alert Behavior During Therapy: WFL for tasks assessed/performed   PT - Cognitive impairments: No apparent impairments                         Following commands: Intact      Cueing Cueing Techniques: Verbal cues, Gestural cues  Exercises      General Comments        Pertinent Vitals/Pain Pain Assessment Pain Assessment: No/denies pain    Home Living                          Prior Function  PT Goals (current goals can now be found in the care plan section) Acute Rehab PT Goals Patient Stated Goal: to return home, get better PT Goal Formulation: With patient Time For Goal Achievement: 05/09/24 Potential to Achieve Goals: Good Progress towards PT goals: Progressing toward goals    Frequency    Min 2X/week      PT Plan      Co-evaluation              AM-PAC PT 6 Clicks Mobility   Outcome Measure  Help needed turning from your back to your side while in a  flat bed without using bedrails?: None Help needed moving from lying on your back to sitting on the side of a flat bed without using bedrails?: A Little Help needed moving to and from a bed to a chair (including a wheelchair)?: A Little Help needed standing up from a chair using your arms (e.g., wheelchair or bedside chair)?: A Little Help needed to walk in hospital room?: A Little Help needed climbing 3-5 steps with a railing? : A Lot 6 Click Score: 18    End of Session Equipment Utilized During Treatment: Oxygen ;Gait belt Activity Tolerance: Patient tolerated treatment well;Patient limited by fatigue Patient left: in chair;with call bell/phone within reach Nurse Communication: Mobility status PT Visit Diagnosis: Difficulty in walking, not elsewhere classified (R26.2);Other abnormalities of gait and mobility (R26.89);Unsteadiness on feet (R26.81);Muscle weakness (generalized) (M62.81)     Time: 8956-8944 PT Time Calculation (min) (ACUTE ONLY): 12 min  Charges:    $Therapeutic Activity: 8-22 mins PT General Charges $$ ACUTE PT VISIT: 1 Visit                     Wendy Lesches DPT, PT     Wendy [name] 04/30/2024, 10:58 AM

## 2024-05-01 DIAGNOSIS — R6520 Severe sepsis without septic shock: Secondary | ICD-10-CM | POA: Diagnosis not present

## 2024-05-01 DIAGNOSIS — A419 Sepsis, unspecified organism: Secondary | ICD-10-CM | POA: Diagnosis not present

## 2024-05-01 LAB — GLUCOSE, CAPILLARY
Glucose-Capillary: 128 mg/dL — ABNORMAL HIGH (ref 70–99)
Glucose-Capillary: 77 mg/dL (ref 70–99)

## 2024-05-01 MED ORDER — NYSTATIN 100000 UNIT/ML MT SUSP
5.0000 mL | Freq: Four times a day (QID) | OROMUCOSAL | Status: DC
  Administered 2024-05-01 – 2024-05-02 (×4): 500000 [IU] via OROMUCOSAL
  Filled 2024-05-01 (×4): qty 5

## 2024-05-01 MED ORDER — LIDOCAINE 5 % EX PTCH
1.0000 | MEDICATED_PATCH | CUTANEOUS | Status: DC
  Administered 2024-05-01 – 2024-05-02 (×2): 1 via TRANSDERMAL
  Filled 2024-05-01 (×3): qty 1

## 2024-05-01 NOTE — TOC Progression Note (Signed)
 Transition of Care [facility]) - Progression Note    Patient Details  Name: Wendy [name] MRN: [id number] Date of Birth: [birth year]  Transition of Care [facility] CM/SW Contact  Victory Jackquline RAMAN, RN Phone Number: 05/01/2024, 4:28 PM  Clinical Narrative:   RNCM spoke to patient's son Marcey @ [phone], introduced role and explained that discharge planning would be discussed. Son states that his mom wants to go to [facility] in [place]. I explained that she had 3 facilities that were pending bed offers and that as soon as we heard something that we would be in touch with him. RNCM will continue to follow for discharge planning needs.                        Expected Discharge Plan and Services                                               Social Drivers of Health (SDOH) Interventions SDOH Screenings   Food Insecurity: No Food Insecurity (04/21/2024)  Housing: Low Risk  (04/21/2024)  Transportation Needs: No Transportation Needs (04/21/2024)  Utilities: Not At Risk (04/21/2024)  Financial Resource Strain: Low Risk  (07/28/2023)   Received from [facility]  Social Connections: Socially Isolated (04/21/2024)  Tobacco Use: Low Risk  (04/22/2024)    Readmission Risk Interventions     No data to display

## 2024-05-01 NOTE — Plan of Care (Signed)
  Problem: Education: Goal: Knowledge of General Education information will improve Description: Including pain rating scale, medication(s)/side effects and non-pharmacologic comfort measures Outcome: Progressing   Problem: Health Behavior/Discharge Planning: Goal: Ability to manage health-related needs will improve Outcome: Progressing   Problem: Clinical Measurements: Goal: Ability to maintain clinical measurements within normal limits will improve Outcome: Progressing Goal: Will remain free from infection Outcome: Progressing Goal: Diagnostic test results will improve Outcome: Progressing Goal: Respiratory complications will improve Outcome: Progressing Goal: Cardiovascular complication will be avoided Outcome: Progressing   Problem: Activity: Goal: Risk for activity intolerance will decrease Outcome: Progressing   Problem: Nutrition: Goal: Adequate nutrition will be maintained Outcome: Progressing   Problem: Coping: Goal: Level of anxiety will decrease Outcome: Progressing   Problem: Elimination: Goal: Will not experience complications related to bowel motility Outcome: Progressing Goal: Will not experience complications related to urinary retention Outcome: Progressing   Problem: Pain Managment: Goal: General experience of comfort will improve and/or be controlled Outcome: Progressing   Problem: Safety: Goal: Ability to remain free from injury will improve Outcome: Progressing   Problem: Skin Integrity: Goal: Risk for impaired skin integrity will decrease Outcome: Progressing   Problem: Education: Goal: Knowledge of condition and prescribed therapy will improve Outcome: Progressing   Problem: Cardiac: Goal: Will achieve and/or maintain adequate cardiac output Outcome: Progressing   Problem: Physical Regulation: Goal: Complications related to the disease process, condition or treatment will be avoided or minimized Outcome: Progressing   Problem:  Activity: Goal: Ability to tolerate increased activity will improve Outcome: Progressing   Problem: Clinical Measurements: Goal: Ability to maintain a body temperature in the normal range will improve Outcome: Progressing   Problem: Respiratory: Goal: Ability to maintain adequate ventilation will improve Outcome: Progressing Goal: Ability to maintain a clear airway will improve Outcome: Progressing

## 2024-05-01 NOTE — Progress Notes (Signed)
 PROGRESS NOTE    Wendy [name]  FMW:[id number] DOB: January 28, 1941 DOA: 04/20/2024 PCP: [facility]  Chief Complaint  Patient presents with   Chest Pain   Near Syncope   Diarrhea    Hospital Course:  83 y.o. 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  She denies abdominal pain or vomiting she was hypotensive to the 60s with EMS and was placed on Levophed which was discontinued soon after arrival in the ED. On arrival in the ED, BP 83/57 with otherwise normal vitals Labs notable for normal WBC, hemoglobin 11.9 (baseline 12).  CMP notable for potassium of 2.5 with creatinine of 2.02 (baseline 1.5) Lipase and LFTs WNL and magnesium normal Stool culture ordered from the ED Patient treated with IV potassium Admission requested.   11/20.  Patient having diarrhea for 3 weeks.  Stool studies sent off and negative.  EGD negative.  Prepping for colonoscopy and this evening will have a colonoscopy tomorrow 11/21. Colonoscopy negative but biosies taken 11/22.  Patient diagnosed with pneumonia overnight.  Starting rocephin  and zithromax .  Patient went into rapid afib this am.  Coughing up blood.  Placed on oxygen .  11/22 continued.  Needed to switch over to amiodarone  drip to control heart rate with blood pressure being on the lower side.  Will hold further Lasix  and give a fluid bolus.  Consulted pulmonary for coughing up blood.  With starting amiodarone  will have to change Zithromax  over to doxycycline .  With possible aspiration will change Rocephin  over to Zosyn .  Steroids started by pulmonary.  11/23.  Patient complained of chest pain this morning.  Given a fluid  bolus for low blood pressure.  Troponins trended down from yesterday.  Patient still having some diarrhea and coughing up blood.  This afternoon down to 5 L nasal cannula. 11/24.  Patient still complaining of coughing up blood.  Still having some rib pains.  Still having some diarrhea. 11/25.  Patient still coughing up a little blood still having some diarrhea.  Colon biopsies did not show any pathology.  Supportive care with cholestyramine  and as needed Imodium .  Can consider Xifaxan.  Switch antibiotics for pneumonia to Rocephin  only since Streptococcus pneumonia growing out of sputum culture.  Physical therapy recommending rehab. 11/26.  Still having diarrhea.  Schedule Imodium  11/27 much improvement in diarrhea.  Patient still very weak.  She is hesitant to discharge home today.  She is refusing SNF and home health 11/28 diarrhea improved.  Attempted to wean patient to room air but desatting into the upper 80s.  She is now amendable to SNF.  Will start this process 11/29-30 no acute events overnight pending SNF placement  Subjective: No acute events overnight.  Patient still complaining of a sore throat.  Reports tea and honey helped minimally yesterday.   Objective: Vitals:   05/01/24 0721 05/01/24 0918 05/01/24 1144 05/01/24 1209  BP:  (!) 124/46 (!) 127/91 (!) 121/40  Pulse:  71 79 73  Resp:  18 18   Temp:  98.2 F (36.8 C) 98.6 F (37 C)   TempSrc:      SpO2: 99% 99% 96%   Weight:      Height:        Intake/Output  Summary (Last 24 hours) at 05/01/2024 1609 Last data filed at 05/01/2024 1413 Gross per 24 hour  Intake 660 ml  Output --  Net 660 ml   Filed Weights   04/29/24 0500 04/30/24 0500 05/01/24 0500  Weight: 109.1 kg 108.8 kg 107.9 kg    Examination: General exam: Appears calm and comfortable, NAD  Respiratory system: No work of breathing, symmetric chest wall expansion, 2 L in place Mouth: Mallampati class 4, some white plaques at back of tongue and hard  palate. Cardiovascular system: S1 & S2 heard, RRR.  Gastrointestinal system: Abdomen is nondistended, soft and nontender.  Neuro: Alert and oriented. No focal neurological deficits. Extremities: Symmetric, expected ROM Skin: No rashes, lesions Psychiatry: Demonstrates appropriate judgement and insight. Mood & affect appropriate for situation.   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 - Stool studies negative - Colonoscopy negative, biopsies unremarkable - Continue with cholestyramine  and scheduled Imodium  which appears to be helping significantly. - Allopurinol  discontinued, watch for gout flares - Patient does have history of colon resection, extensive history of IBS, and is on daily antibiotics outpatient for recurrent UTI.  All of the above contributing to her chronic diarrhea.   Sore throat - Some scattered white plaques seen at back of throat.  Difficult to view total pharynx given Mallampati class IV - Will try nystatin mouthwash - Continue lozenges and tea and honey - Suspect residual from coughing due to recent pneumonia  Generalized deconditioning - Secondary to all of the below - PT/OT recommending SNF.  Initially she refused.  She is now amendable.  TOC consulted and we are awaiting updates.  Severe sepsis (HCC) Strep  pneumo pneumonia -Left upper lobe pneumonia with tachycardia, tachypnea, leukocytosis, lactic acidosis. - Strep Pneumo ag and sputum positive - Continue ceftriaxone  through 11/28.  - Blood cultures negative   Hemoptysis, resolved -Prior attending has discussed with pulmonology who believes this is secondary to pneumonia and anticipates it will improve with continue with treatment as above  Acute respiratory failure with hypoxia (HCC) -O2 sat 88% on room air, was on nonrebreather when diagnosed with pneumonia eventually requiring high flow nasal cannula -Required about 2 L, difficulty weaning to room air.  - Anticipate this is due to significant deconditioning now.  Encourage incentive spirometry and flutter valve -Status post 5-day prednisone course.  Rapid atrial fibrillation (HCC) -Now NSR - Amiodarone  drip started 11/22, switched to oral 11/23 - Continue amiodarone  taper.  200 mg twice daily starting 11/28.  Plan for once daily at DC - No blood thinners secondary to hemoptysis    Hypotension resolved   Acute renal failure superimposed on stage 3b chronic kidney disease (HCC) -Prerenal secondary to GI losses resulting in hypoperfusion with ATN - Metabolic acidosis resolved - Creatinine on presentation 2.0, has resolved  to 1.2 for now - Continue to encourage p.o. intake.  Avoid additional IV fluids given CHF.  Acute on chronic diastolic CHF (congestive heart failure) (HCC) -Continue holding Lasix    Hypokalemia -Replace needed   Increased anion gap metabolic acidosis Secondary to AKI on CKD.  Improved now.  Recurrent syncope -Likely secondary to dehydration, hypotension and hypokalemia   Melena -EGD negative.     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  Will continue at DC   Hypothyroidism On levothyroxine    Obstructive sleep apnea CPAP nightly if desired   Body mass index is 38.39 kg/m. Obesity Class 2 - Outpatient follow up for lifestyle modification and risk factor management   DVT prophylaxis: SCDs for now   Code Status: Full Code Disposition: Now reports feeling ready for skilled nursing facility.  Have consulted TOC who will work on making these arrangements.  Anticipate she will be in house until this is arranged which may take a few days  Consultants:  Treatment Team:  Consulting Physician: Jinny Carmine, MD Consulting Physician: Malka Domino, MD  Procedures:    Antimicrobials:  Anti-infectives (From admission, onward)    Start     Dose/Rate Route Frequency Ordered Stop   04/25/24 2100  cefTRIAXone  (ROCEPHIN ) 2 g in sodium chloride  0.9 % 100 mL IVPB        2 g 200 mL/hr over 30 Minutes Intravenous Every 24 hours 04/25/24 1459 04/29/24 1710   04/23/24 2200  doxycycline  (VIBRAMYCIN ) 100 mg in sodium chloride  0.9 % 250 mL IVPB  Status:  Discontinued        100 mg 125 mL/hr over 120 Minutes Intravenous Every 12 hours 04/23/24 1154 04/26/24 1326   04/23/24 1245  piperacillin -tazobactam (ZOSYN ) IVPB 3.375 g        3.375 g 12.5 mL/hr over 240 Minutes Intravenous Every 8 hours 04/23/24 1148 04/25/24 1747   04/23/24 0745  cefTRIAXone  (ROCEPHIN ) 2 g in sodium chloride  0.9 % 100 mL IVPB  Status:  Discontinued        2 g 200 mL/hr over 30 Minutes Intravenous Every 24 hours 04/23/24 0654 04/23/24 1144   04/23/24 0745  azithromycin  (ZITHROMAX ) 500 mg in sodium chloride  0.9 % 250 mL IVPB  Status:  Discontinued        500 mg 250 mL/hr over 60 Minutes Intravenous Every 24 hours 04/23/24 0654 04/23/24 1154       Data Reviewed: I have personally reviewed  following labs and imaging studies CBC: Recent Labs  Lab 04/25/24 0308 04/27/24 0917  WBC 9.6 11.7*  NEUTROABS  --  9.3*  HGB 10.5* 12.7  HCT 31.7* 39.6  MCV 99.4 99.2  PLT 218 321   Basic Metabolic Panel: Recent Labs  Lab 04/25/24 0308 04/27/24 0917  NA 139 142  K 5.0 5.0  CL 108 110  CO2 23 20*  GLUCOSE 114* 143*  BUN 43* 35*  CREATININE 1.59* 1.24*  CALCIUM 8.3* 9.2   GFR: Estimated Creatinine Clearance: 44.2 mL/min (A) (by C-G formula based on SCr of 1.24 mg/dL (H)). Liver Function Tests: Recent Labs  Lab 04/27/24 0917  AST 35  ALT 53*  ALKPHOS 132*  BILITOT 0.4  PROT 6.6  ALBUMIN 3.3*   CBG: Recent Labs  Lab 04/29/24 0612 04/30/24 0457 04/30/24 2125 05/01/24 0005 05/01/24 0417  GLUCAP 77 80 112* 128* 77    Recent Results (from the past 240 hours)  Culture, blood (routine x 2) Call MD if unable to obtain prior to antibiotics being given     Status: None   Collection Time: 04/23/24  7:50 AM   Specimen: BLOOD  Result Value Ref Range Status   Specimen Description BLOOD BLOOD LEFT HAND  Final   Special Requests   Final    BOTTLES DRAWN AEROBIC AND ANAEROBIC Blood Culture results may not be optimal due to an excessive volume of blood received in culture bottles   Culture   Final    NO GROWTH 5 DAYS Performed at [facility], [address] [place], [place] [address]    Report Status 04/28/2024 FINAL  Final  Resp panel by RT-PCR (RSV, Flu A&B, Covid) Anterior Nasal Swab     Status: None   Collection Time: 04/23/24  9:20 AM   Specimen: Anterior Nasal Swab  Result Value Ref Range Status   SARS Coronavirus 2 by RT PCR NEGATIVE NEGATIVE Final    Comment: (NOTE) SARS-CoV-2 target nucleic acids are NOT DETECTED.  The SARS-CoV-2 RNA is generally detectable in upper respiratory specimens during the acute phase of infection. The lowest concentration of SARS-CoV-2 viral copies this assay can detect is 138 copies/mL. A negative result does not  preclude SARS-Cov-2 infection and should not be used as the sole basis for treatment or other patient management decisions. A negative result may occur with  improper specimen collection/handling, submission of specimen other than nasopharyngeal swab, presence of viral mutation(s) within the areas targeted by this assay, and inadequate number of viral copies(<138 copies/mL). A negative result must be combined with clinical observations, patient history, and epidemiological information. The expected result is Negative.  Fact Sheet for Patients:  bloggercourse.com  Fact Sheet for Healthcare Providers:  seriousbroker.it  This test is no t yet approved or cleared by the [place]  FDA and  has been authorized for detection and/or diagnosis of SARS-CoV-2 by FDA under an Emergency Use Authorization (EUA). This EUA will remain  in effect (meaning this test can be used) for the duration of the COVID-19 declaration under Section 564(b)(1) of the Act, 21 U.S.C.section 360bbb-3(b)(1), unless the authorization is terminated  or revoked sooner.       Influenza A by PCR NEGATIVE NEGATIVE Final   Influenza B by PCR NEGATIVE NEGATIVE Final    Comment: (NOTE) The Xpert Xpress SARS-CoV-2/FLU/RSV plus assay is intended as an aid in the diagnosis of influenza from Nasopharyngeal swab specimens and should not be used as a sole basis for treatment. Nasal washings and aspirates are unacceptable for Xpert Xpress SARS-CoV-2/FLU/RSV testing.  Fact Sheet for Patients: bloggercourse.com  Fact Sheet for Healthcare Providers: seriousbroker.it  This test is not yet approved or cleared by the [place]  FDA and has been authorized for detection and/or diagnosis of SARS-CoV-2 by FDA under an Emergency Use Authorization (EUA). This EUA will remain in effect (meaning this test can be used) for the  duration of the COVID-19 declaration under Section 564(b)(1) of the Act, 21 U.S.C. section 360bbb-3(b)(1), unless the authorization is terminated or revoked.     Resp Syncytial Virus by PCR NEGATIVE NEGATIVE Final    Comment: (NOTE) Fact Sheet for Patients: bloggercourse.com  Fact Sheet for Healthcare Providers: seriousbroker.it  This  test is not yet approved or cleared by the [place]  FDA and has been authorized for detection and/or diagnosis of SARS-CoV-2 by FDA under an Emergency Use Authorization (EUA). This EUA will remain in effect (meaning this test can be used) for the duration of the COVID-19 declaration under Section 564(b)(1) of the Act, 21 U.S.C. section 360bbb-3(b)(1), unless the authorization is terminated or revoked.  Performed at [facility], [address] [place], [place] [address]   Culture, blood (Routine X 2) w Reflex to ID Panel     Status: None   Collection Time: 04/23/24 10:39 AM   Specimen: BLOOD  Result Value Ref Range Status   Specimen Description BLOOD BLOOD LEFT HAND  Final   Special Requests   Final    BOTTLES DRAWN AEROBIC AND ANAEROBIC Blood Culture adequate volume   Culture   Final    NO GROWTH 5 DAYS Performed at [facility], [address] [place], [place] [address]    Report Status 04/28/2024 FINAL  Final  MRSA Next Gen by PCR, Nasal     Status: None   Collection Time: 04/23/24 11:42 AM   Specimen: Nasal Mucosa; Nasal Swab  Result Value Ref Range Status   MRSA by PCR Next Gen NOT DETECTED NOT DETECTED Final    Comment: (NOTE) The GeneXpert MRSA Assay (FDA approved for NASAL specimens only), is one component of a comprehensive MRSA colonization surveillance program. It is not intended to diagnose MRSA infection nor to guide or monitor treatment for MRSA infections. Test performance is not FDA approved in patients less than 1 years old. Performed at [facility], [address] [place], [place] [address]   Expectorated Sputum Assessment w Gram Stain, Rflx to Resp Cult     Status: None   Collection Time: 04/23/24 11:42 AM   Specimen: Urine, Suprapubic; Sputum  Result Value Ref Range Status   Specimen Description URINE, SUPRAPUBIC  Final   Special Requests NONE  Final   Sputum evaluation   Final    THIS SPECIMEN IS ACCEPTABLE FOR SPUTUM CULTURE Performed at [facility], [address], [place], [place] [address]    Report Status 04/23/2024 FINAL  Final  Culture, Respiratory w Gram Stain     Status: None   Collection Time: 04/23/24 11:42 AM   Specimen: SPU  Result Value Ref Range Status   Specimen Description   Final    SPUTUM Performed at [facility], [address] [place], [place] [address]    Special Requests   Final    NONE Reflexed from [id number] Performed at [facility], [address] [place], [place] [address]    Gram Stain   Final    FEW WBC PRESENT, PREDOMINANTLY PMN MODERATE GRAM POSITIVE COCCI Performed at [facility], [address], [place], [place] [address]    Culture FEW STREPTOCOCCUS PNEUMONIAE  Final   Report Status 04/26/2024 FINAL  Final   Organism ID, Bacteria STREPTOCOCCUS PNEUMONIAE  Final      Susceptibility   Streptococcus pneumoniae - MIC*    ERYTHROMYCIN <=0.12 SENSITIVE Sensitive     LEVOFLOXACIN  1 SENSITIVE Sensitive     VANCOMYCIN 0.5 SENSITIVE Sensitive     PENICILLIN (meningitis) <=0.06 SENSITIVE Sensitive     PENO - penicillin <=0.06      PENICILLIN (non-meningitis) <=0.06 SENSITIVE Sensitive     PENICILLIN (oral) <=0.06 SENSITIVE Sensitive     CEFTRIAXONE  (non-meningitis) <=0.12 SENSITIVE Sensitive  CEFTRIAXONE  (meningitis) <=0.12 SENSITIVE Sensitive     * FEW STREPTOCOCCUS PNEUMONIAE     Radiology Studies: No results found.  Scheduled Meds:  acidophilus  2 capsule Oral TID   amiodarone   200 mg Oral BID   amLODipine   10 mg Oral Daily    budesonide  (PULMICORT ) nebulizer solution  0.5 mg Nebulization BID   cholestyramine  light  4 g Oral BID   gabapentin   300 mg Oral TID   levothyroxine   50 mcg Oral Q0600   lidocaine   1 patch Transdermal Q24H   liver oil-zinc  oxide   Topical BID   loperamide   2 mg Oral Q8H   nystatin   5 mL Mouth/Throat QID   pantoprazole   40 mg Oral QHS   pravastatin   80 mg Oral Daily   Continuous Infusions:     LOS: 8 days  MDM: Patient is high risk for one or more organ failure.  They necessitate ongoing hospitalization for continued IV therapies and subsequent lab monitoring. Total time spent interpreting labs and vitals, reviewing the medical record, coordinating care amongst consultants and care team members, directly assessing and discussing care with the patient and/or family: 55 min  Joenathan Sakuma, DO [facility]  To contact the attending physician between 7A-7P please use Epic Chat. To contact the covering physician during after hours 7P-7A, please review Amion.  05/01/2024, 4:09 PM   *This document has been created with the assistance of dictation software. Please excuse typographical errors. *

## 2024-05-01 NOTE — Progress Notes (Signed)
 Mobility Specialist - Progress Note    05/01/24 1633  Mobility  Activity Ambulated with assistance;Stood at bedside;Respositioned in chair  Level of Assistance Contact guard assist, steadying assist  Assistive Device Front wheel walker  Distance Ambulated (ft) 25 ft  Range of Motion/Exercises Active;All extremities  Activity Response Tolerated fair  Mobility visit 1 Mobility  Mobility Specialist Start Time (ACUTE ONLY) 1400  Mobility Specialist Stop Time (ACUTE ONLY) 1413  Mobility Specialist Time Calculation (min) (ACUTE ONLY) 13 min   Pt was in the room in the recliner with guest on O2 @ 3L. Pt agreed to mobility. Pt is able today to STS with 2 WW. Pt did describe reoccurring cough. Pt ambulated well. Pt did need recovery breaks due to cough. After activity pt returned to the room repositioned in the recliner with needs in reach and guest in the room upon exit.  Clem Rodes Mobility Specialist 05/01/24, 4:38 PM

## 2024-05-02 DIAGNOSIS — R6520 Severe sepsis without septic shock: Secondary | ICD-10-CM | POA: Diagnosis not present

## 2024-05-02 DIAGNOSIS — A419 Sepsis, unspecified organism: Secondary | ICD-10-CM | POA: Diagnosis not present

## 2024-05-02 LAB — GLUCOSE, CAPILLARY: Glucose-Capillary: 93 mg/dL (ref 70–99)

## 2024-05-02 MED ORDER — AMIODARONE HCL 200 MG PO TABS
200.0000 mg | ORAL_TABLET | Freq: Every day | ORAL | Status: DC
  Administered 2024-05-03: 200 mg via ORAL
  Filled 2024-05-02: qty 1

## 2024-05-02 MED ORDER — AMLODIPINE BESYLATE 5 MG PO TABS
5.0000 mg | ORAL_TABLET | Freq: Every day | ORAL | Status: DC
  Filled 2024-05-02: qty 1

## 2024-05-02 MED ORDER — MAGIC MOUTHWASH W/LIDOCAINE
5.0000 mL | Freq: Four times a day (QID) | ORAL | Status: DC
  Administered 2024-05-02 – 2024-05-03 (×7): 5 mL via ORAL
  Filled 2024-05-02 (×8): qty 5

## 2024-05-02 NOTE — Progress Notes (Signed)
 Occupational Therapy Treatment Patient Details Name: Wendy [name] MRN: [id number] DOB: 12-23-1940 Today's Date: 05/02/2024   History of present illness 83 y.o. 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   OT comments  Wendy [name] was seen for OT treatment on this date. Upon arrival to room pt seated in chair, agreeable to tx. Pt requires CGA + RW for toilet t/f. MIN A don underwear, assist for threading over BLE. SBA standing grooming tasks. Reports 8/10 fatigue, educated on falls prevention. Pt making good progress toward goals, will continue to follow POC. Discharge recommendation remains appropriate as pt lives alone.       If plan is discharge home, recommend the following:  A lot of help with walking and/or transfers;A lot of help with bathing/dressing/bathroom;Assistance with cooking/housework;Assist for transportation;Help with stairs or ramp for entrance   Equipment Recommendations  Other (comment) (defer)    Recommendations for Other Services      Precautions / Restrictions Precautions Precautions: Fall Recall of Precautions/Restrictions: Intact Restrictions Weight Bearing Restrictions Per Provider Order: No       Mobility Bed Mobility               General bed mobility comments: not tested    Transfers Overall transfer level: Needs assistance Equipment used: Rolling walker (2 wheels) Transfers: Sit to/from Stand Sit to Stand: Supervision                 Balance Overall balance assessment: Needs assistance Sitting-balance support: No upper extremity supported, Feet supported Sitting balance-Wendy [name] Scale: Good     Standing balance support: No upper extremity supported, During functional activity Standing  balance-Wendy [name] Scale: Fair                             ADL either performed or assessed with clinical judgement   ADL Overall ADL's : Needs assistance/impaired                                       General ADL Comments: CGA + RW for toilet t/f. MIN A don underwear, assist for threading over BLE. SBA standing grooming tasks     Vision       Perception     Praxis     Communication     Cognition Arousal: Alert Behavior During Therapy: WFL for tasks assessed/performed Cognition: No apparent impairments                               Following commands: Intact                 General Comments SpO2 94% on 3L [place]    Pertinent Vitals/ Pain       Pain Assessment Pain Assessment: No/denies pain   Frequency  Min 2X/week        Progress Toward Goals  OT Goals(current goals can now be found in the care plan section)  Progress towards OT goals: Progressing toward goals  Acute Rehab OT Goals OT Goal Formulation: With patient Time For Goal Achievement: 05/09/24 Potential to Achieve Goals: Fair ADL Goals Pt Will Perform Grooming: standing;with supervision  Pt Will Perform Lower Body Dressing: sit to/from stand;with supervision Pt Will Transfer to Toilet: ambulating;with supervision Pt Will Perform Toileting - Clothing Manipulation and hygiene: sit to/from stand;with supervision  Plan      Co-evaluation                 AM-PAC OT 6 Clicks Daily Activity     Outcome Measure   Help from another person eating meals?: None Help from another person taking care of personal grooming?: A Little Help from another person toileting, which includes using toliet, bedpan, or urinal?: A Little Help from another person bathing (including washing, rinsing, drying)?: A Lot Help from another person to put on and taking off regular upper body clothing?: A Little Help from another person to put on and taking off regular lower body  clothing?: A Little 6 Click Score: 18    End of Session Equipment Utilized During Treatment: Oxygen ;Rolling walker (2 wheels);Gait belt  OT Visit Diagnosis: Unsteadiness on feet (R26.81);Repeated falls (R29.6);Muscle weakness (generalized) (M62.81)   Activity Tolerance Patient tolerated treatment well   Patient Left in chair;with call bell/phone within reach   Nurse Communication          Time: 8473-8461 OT Time Calculation (min): 12 min  Charges: OT General Charges $OT Visit: 1 Visit OT Treatments $Self Care/Home Management : 8-22 mins  Elston Slot, M.S. OTR/L  05/02/24, 3:56 PM  ascom [phone]

## 2024-05-02 NOTE — Plan of Care (Signed)
  Problem: Education: Goal: Knowledge of General Education information will improve Description: Including pain rating scale, medication(s)/side effects and non-pharmacologic comfort measures Outcome: Progressing   Problem: Health Behavior/Discharge Planning: Goal: Ability to manage health-related needs will improve Outcome: Progressing   Problem: Clinical Measurements: Goal: Ability to maintain clinical measurements within normal limits will improve Outcome: Progressing Goal: Will remain free from infection Outcome: Progressing Goal: Diagnostic test results will improve Outcome: Progressing Goal: Respiratory complications will improve Outcome: Progressing Goal: Cardiovascular complication will be avoided Outcome: Progressing   Problem: Activity: Goal: Risk for activity intolerance will decrease Outcome: Progressing   Problem: Nutrition: Goal: Adequate nutrition will be maintained Outcome: Progressing   Problem: Coping: Goal: Level of anxiety will decrease Outcome: Progressing   Problem: Elimination: Goal: Will not experience complications related to bowel motility Outcome: Progressing Goal: Will not experience complications related to urinary retention Outcome: Progressing   Problem: Pain Managment: Goal: General experience of comfort will improve and/or be controlled Outcome: Progressing   Problem: Safety: Goal: Ability to remain free from injury will improve Outcome: Progressing   Problem: Skin Integrity: Goal: Risk for impaired skin integrity will decrease Outcome: Progressing   Problem: Education: Goal: Knowledge of condition and prescribed therapy will improve Outcome: Progressing   Problem: Cardiac: Goal: Will achieve and/or maintain adequate cardiac output Outcome: Progressing   Problem: Physical Regulation: Goal: Complications related to the disease process, condition or treatment will be avoided or minimized Outcome: Progressing   Problem:  Activity: Goal: Ability to tolerate increased activity will improve Outcome: Progressing   Problem: Clinical Measurements: Goal: Ability to maintain a body temperature in the normal range will improve Outcome: Progressing   Problem: Respiratory: Goal: Ability to maintain adequate ventilation will improve Outcome: Progressing Goal: Ability to maintain a clear airway will improve Outcome: Progressing

## 2024-05-02 NOTE — TOC Progression Note (Signed)
 Transition of Care [facility]) - Progression Note    Patient Details  Name: Wendy [name] MRN: [id number] Date of Birth: [birth year]  Transition of [facility]) CM/SW Contact  Shasta DELENA Daring, RN Phone Number: 05/02/2024, 4:28 PM  Clinical Narrative:     RNCM presented patient with bed offers from [facility] and [facility]. Patient selected [facility] for SNF rehab. Will follow for discharge readiness.                    Expected Discharge Plan and Services                                               Social Drivers of Health (SDOH) Interventions SDOH Screenings   Food Insecurity: No Food Insecurity (04/21/2024)  Housing: Low Risk  (04/21/2024)  Transportation Needs: No Transportation Needs (04/21/2024)  Utilities: Not At Risk (04/21/2024)  Financial Resource Strain: Low Risk  (07/28/2023)   Received from [facility]  Social Connections: Socially Isolated (04/21/2024)  Tobacco Use: Low Risk  (04/22/2024)    Readmission Risk Interventions     No data to display

## 2024-05-02 NOTE — Progress Notes (Signed)
 PROGRESS NOTE    Wendy [name]  FMW:[id number] DOB: April 11, 1941 DOA: 04/20/2024 PCP: [facility]  Chief Complaint  Patient presents with   Chest Pain   Near Syncope   Diarrhea    Hospital Course:  83 y.o. 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  She denies abdominal pain or vomiting she was hypotensive to the 60s with EMS and was placed on Levophed which was discontinued soon after arrival in the ED. On arrival in the ED, BP 83/57 with otherwise normal vitals Labs notable for normal WBC, hemoglobin 11.9 (baseline 12).  CMP notable for potassium of 2.5 with creatinine of 2.02 (baseline 1.5) Lipase and LFTs WNL and magnesium normal Stool culture ordered from the ED Patient treated with IV potassium Admission requested.   11/20.  Patient having diarrhea for 3 weeks.  Stool studies sent off and negative.  EGD negative.  Prepping for colonoscopy and this evening will have a colonoscopy tomorrow 11/21. Colonoscopy negative but biosies taken 11/22.  Patient diagnosed with pneumonia overnight.  Starting rocephin  and zithromax .  Patient went into rapid afib this am.  Coughing up blood.  Placed on oxygen .  11/22 continued.  Needed to switch over to amiodarone  drip to control heart rate with blood pressure being on the lower side.  Will hold further Lasix  and give a fluid bolus.  Consulted pulmonary for coughing up blood.  With starting amiodarone  will have to change Zithromax  over to doxycycline .  With possible aspiration will change Rocephin  over to Zosyn .  Steroids started by pulmonary.  11/23.  Patient complained of chest pain this morning.  Given a fluid  bolus for low blood pressure.  Troponins trended down from yesterday.  Patient still having some diarrhea and coughing up blood.  This afternoon down to 5 L nasal cannula. 11/24.  Patient still complaining of coughing up blood.  Still having some rib pains.  Still having some diarrhea. 11/25.  Patient still coughing up a little blood still having some diarrhea.  Colon biopsies did not show any pathology.  Supportive care with cholestyramine  and as needed Imodium .  Can consider Xifaxan.  Switch antibiotics for pneumonia to Rocephin  only since Streptococcus pneumonia growing out of sputum culture.  Physical therapy recommending rehab. 11/26.  Still having diarrhea.  Schedule Imodium  11/27 much improvement in diarrhea.  Patient still very weak.  She is hesitant to discharge home today.  She is refusing SNF and home health 11/28 diarrhea improved.  Attempted to wean patient to room air but desatting into the upper 80s.  She is now amendable to SNF.  Will start this process 11/29-30 no acute events overnight pending SNF placement 12/1: Patient complaining of constipation.  Would like to stop Imodium .  Reports her sore throat is improving with nystatin  mouthwash  Subjective: She reports her diarrhea is improved.  She reports she has constipation and she would like to back off on the Imodium .  She reports her sore throat is improving with nystatin  mouthwash but she is still having some pain in between doses.  Objective: Vitals:   05/02/24 0733 05/02/24 1126 05/02/24 1417 05/02/24 1426  BP: 132/68 (!) 114/34 (!) 103/42 (!) 112/47  Pulse: 78 72 71 73  Resp: 19  18 16   Temp: 98.2 F (36.8 C) 97.8 F (36.6 C) 98.2 F (36.8 C)   TempSrc: Oral Oral    SpO2: 98% 97% 97%   Weight:      Height:        Intake/Output Summary (Last 24 hours) at 05/02/2024 1514 Last data filed at 05/02/2024 1428 Gross per 24 hour  Intake --  Output 825 ml  Net -825 ml   Filed Weights   04/30/24 0500 05/01/24 0500  05/02/24 0450  Weight: 108.8 kg 107.9 kg 100.6 kg    Examination: General exam: Appears calm and comfortable, NAD  Respiratory system: No work of breathing, symmetric chest wall expansion, 2 L in place Mouth: Mallampati class 4, some white plaques at back of tongue and hard palate. Cardiovascular system: S1 & S2 heard, RRR.  Gastrointestinal system: Abdomen is nondistended, soft and nontender.  Neuro: Alert and oriented. No focal neurological deficits. Extremities: Symmetric, expected ROM Skin: No rashes, lesions Psychiatry: Demonstrates appropriate judgement and insight. Mood & affect appropriate for situation.   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 - Stool studies negative - Colonoscopy negative, biopsies unremarkable findings consistent with acute diarrhea such as infection versus medication side effect - Continue probiotics - Continue with cholestyramine  - Stop Imodium  today due to report of constipation.  May need to resume it in the future - Allopurinol  discontinued in case this was contributing.  Watch for gout flares. - Patient does have history of colon resection, extensive history of IBS, and is on daily antibiotics outpatient for recurrent UTI.  All of the above contributing to her chronic diarrhea.   Sore throat -  Some scattered white plaques seen at back of throat.  Difficult to view total pharynx given Mallampati class IV - Patient reports some improvement with nystatin mouthwash.  Will switch to Magic mouthwash today given persistent complaint of pain - Continue lozenges and tea and honey  Generalized deconditioning - Secondary to all of the below - PT/OT recommending SNF.  Initially she refused and stay was prolonged, as she reported not feeling ready for discharge to home health.  She then became amendable to SNF.  TOC consulted and we are awaiting updates.  Severe sepsis (HCC) Strep pneumo pneumonia -Left upper lobe pneumonia with tachycardia, tachypnea, leukocytosis, lactic acidosis. - Strep Pneumo ag and sputum positive - Has completed course of ceftriaxone  - Blood cultures negative   Hemoptysis, resolved - Home analogy was consulted.  Suspected secondary to pneumonia.  Has resolved now.  Acute respiratory failure with hypoxia (HCC) -O2 sat 88% on room air, was on nonrebreather when diagnosed with pneumonia eventually requiring high flow nasal cannula -Required about 2 L, difficulty weaning to room air.  - Anticipate this is due to significant deconditioning now.  Encourage incentive  spirometry and flutter valve -Status post 5-day prednisone course.  Rapid atrial fibrillation (HCC) -Now NSR - Amiodarone  drip started 11/22, switched to oral 11/23 - Continue amiodarone  taper.  200 mg twice daily starting 11/28.  Will transition to 200 daily now in light of hypotension - No blood thinners secondary to hemoptysis    Hypotension intermittent. - Hypotensive again today.  Transition amiodarone  to once daily dosing   Acute renal failure superimposed on stage 3b chronic kidney disease (HCC) -Prerenal secondary to GI losses resulting in hypoperfusion with ATN - Metabolic acidosis resolved - Creatinine on presentation 2.0, has resolved to 1.2 for now - Continue to encourage p.o. intake.   Avoid additional IV fluids given CHF.  Acute on chronic diastolic CHF (congestive heart failure) (HCC) -Continue holding Lasix    Hypokalemia -Replace needed   Increased anion gap metabolic acidosis Secondary to AKI on CKD.  Improved now.  Recurrent syncope -Likely secondary to dehydration, hypotension and hypokalemia   Melena -EGD negative.     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  Has been on amlodipine  and amiodarone .  Decreasing both   Hypothyroidism On levothyroxine    Obstructive sleep apnea CPAP nightly if desired   Body mass index is 35.78 kg/m. Obesity Class 2 - Outpatient follow up for lifestyle modification and risk factor management   DVT prophylaxis: SCDs for now   Code Status: Full Code Disposition: Now reports feeling ready for skilled nursing facility.  Have consulted TOC who will work on making these arrangements.  Anticipate she will be in house until this is arranged which may take a few more days  Consultants:  Treatment Team:  Consulting Physician: Malka Domino, MD  Procedures:    Antimicrobials:  Anti-infectives (From admission, onward)    Start     Dose/Rate Route Frequency Ordered Stop   04/25/24 2100  cefTRIAXone  (ROCEPHIN ) 2 g in sodium chloride  0.9 % 100 mL IVPB        2 g 200 mL/hr over 30 Minutes Intravenous Every 24 hours 04/25/24 1459 04/29/24 1710   04/23/24 2200  doxycycline  (VIBRAMYCIN ) 100 mg in sodium chloride  0.9 % 250 mL IVPB  Status:  Discontinued        100 mg 125 mL/hr over 120 Minutes Intravenous Every 12 hours 04/23/24 1154 04/26/24 1326    04/23/24 1245  piperacillin -tazobactam (ZOSYN ) IVPB 3.375 g        3.375 g 12.5 mL/hr over 240 Minutes Intravenous Every 8 hours 04/23/24 1148 04/25/24 1747   04/23/24 0745  cefTRIAXone  (ROCEPHIN ) 2 g in sodium chloride  0.9 % 100 mL IVPB  Status:  Discontinued        2 g 200 mL/hr over 30 Minutes Intravenous Every 24 hours 04/23/24 0654 04/23/24 1144   04/23/24 0745  azithromycin  (ZITHROMAX ) 500 mg in sodium chloride  0.9 % 250 mL IVPB  Status:  Discontinued        500 mg 250 mL/hr over 60 Minutes Intravenous Every 24 hours 04/23/24 0654 04/23/24 1154       Data Reviewed: I have personally reviewed following labs and imaging studies CBC: Recent Labs  Lab 04/27/24 0917  WBC 11.7*  NEUTROABS 9.3*  HGB 12.7  HCT 39.6  MCV 99.2  PLT 321   Basic Metabolic Panel: Recent Labs  Lab 04/27/24 0917  NA 142  K 5.0  CL 110  CO2 20*  GLUCOSE 143*  BUN 35*  CREATININE 1.24*  CALCIUM 9.2   GFR: Estimated Creatinine Clearance: 42.6 mL/min (A) (by C-G formula based on SCr of 1.24 mg/dL (H)). Liver Function Tests: Recent Labs  Lab 04/27/24 0917  AST 35  ALT 53*  ALKPHOS 132*  BILITOT 0.4  PROT 6.6  ALBUMIN 3.3*   CBG: Recent Labs  Lab 04/30/24 0457 04/30/24 2125 05/01/24 0005 05/01/24 0417 05/02/24 0450  GLUCAP 80 112* 128* 77 93    Recent Results (from the past 240 hours)  Culture, blood (routine x 2) Call MD if unable to obtain prior to antibiotics being given     Status: None   Collection Time: 04/23/24  7:50 AM   Specimen: BLOOD  Result Value Ref Range Status   Specimen Description BLOOD BLOOD LEFT HAND  Final   Special Requests   Final    BOTTLES DRAWN AEROBIC AND ANAEROBIC Blood Culture results may not be optimal due to an excessive volume of blood received in culture bottles   Culture   Final    NO GROWTH 5 DAYS Performed at [facility], [address] [place], [place] [address]    Report Status 04/28/2024 FINAL  Final  Resp panel by RT-PCR  (RSV, Flu A&B, Covid) Anterior Nasal Swab     Status: None   Collection Time: 04/23/24  9:20 AM   Specimen: Anterior Nasal Swab  Result Value Ref Range Status   SARS Coronavirus 2 by RT PCR NEGATIVE NEGATIVE Final    Comment: (NOTE) SARS-CoV-2 target nucleic acids are NOT DETECTED.  The SARS-CoV-2 RNA is generally detectable in upper respiratory specimens during the acute phase of infection. The lowest concentration of SARS-CoV-2 viral copies this assay can detect is 138 copies/mL. A negative result does not preclude SARS-Cov-2 infection and should not be used as the sole basis for treatment or other patient management decisions. A negative result may occur with  improper specimen collection/handling, submission of specimen other than nasopharyngeal swab, presence of viral mutation(s) within the areas targeted by this assay, and inadequate number of viral copies(<138 copies/mL). A negative result must be combined with clinical observations, patient history, and epidemiological information. The expected result is Negative.  Fact Sheet for Patients:  bloggercourse.com  Fact Sheet for Healthcare Providers:  seriousbroker.it  This test is no t yet approved or cleared by the [place]  FDA and  has been authorized for detection and/or diagnosis of SARS-CoV-2 by FDA under an Emergency Use Authorization (EUA). This EUA will remain  in effect (meaning this test can be used) for the duration of the COVID-19 declaration under Section 564(b)(1) of the Act, 21 U.S.C.section 360bbb-3(b)(1), unless the authorization is terminated  or revoked sooner.       Influenza A by PCR NEGATIVE NEGATIVE Final   Influenza B by PCR NEGATIVE NEGATIVE Final    Comment: (NOTE) The Xpert Xpress SARS-CoV-2/FLU/RSV plus assay is intended as an aid in the diagnosis of influenza from Nasopharyngeal swab specimens and should not be used as a sole basis for  treatment. Nasal washings and aspirates are unacceptable for Xpert Xpress SARS-CoV-2/FLU/RSV testing.  Fact Sheet for Patients: bloggercourse.com  Fact Sheet for Healthcare Providers: seriousbroker.it  This test is not yet approved or cleared by the [place]  FDA and  has been authorized for detection and/or diagnosis of SARS-CoV-2 by FDA under an Emergency Use Authorization (EUA). This EUA will remain in effect (meaning this test can be used) for the duration of the COVID-19 declaration under Section 564(b)(1) of the Act, 21 U.S.C. section 360bbb-3(b)(1), unless the authorization is terminated or revoked.     Resp Syncytial Virus by PCR NEGATIVE NEGATIVE Final    Comment: (NOTE) Fact Sheet for Patients: bloggercourse.com  Fact Sheet for Healthcare Providers: seriousbroker.it  This test is not yet approved or cleared by the [place]  FDA and has been authorized for detection and/or diagnosis of SARS-CoV-2 by FDA under an Emergency Use Authorization (EUA). This EUA will remain in effect (meaning this test can be used) for the duration of the COVID-19 declaration under Section 564(b)(1) of the Act, 21 U.S.C. section 360bbb-3(b)(1), unless the authorization is terminated or revoked.  Performed at [facility], [address] [place], [place] [address]   Culture, blood (Routine X 2) w Reflex to ID Panel     Status: None   Collection Time: 04/23/24 10:39 AM   Specimen: BLOOD  Result Value Ref Range Status   Specimen Description BLOOD BLOOD LEFT HAND  Final   Special Requests   Final    BOTTLES DRAWN AEROBIC AND ANAEROBIC Blood Culture adequate volume   Culture   Final    NO GROWTH 5 DAYS Performed at [facility], [address], [place], [place] [address]    Report Status 04/28/2024 FINAL  Final  MRSA Next Gen by PCR, Nasal     Status: None    Collection Time: 04/23/24 11:42 AM   Specimen: Nasal Mucosa; Nasal Swab  Result Value Ref Range Status   MRSA by PCR Next Gen NOT DETECTED NOT DETECTED Final    Comment: (NOTE) The GeneXpert MRSA Assay (FDA approved for NASAL specimens only), is one component of a comprehensive MRSA colonization surveillance program. It is not intended to diagnose MRSA infection nor to guide or monitor treatment for MRSA infections. Test performance is not FDA approved in patients less than 52 years old. Performed at [facility], [address] [place], [place] [address]   Expectorated Sputum Assessment w Gram Stain, Rflx to Resp Cult     Status: None   Collection Time: 04/23/24 11:42 AM   Specimen: Urine, Suprapubic; Sputum  Result Value Ref Range Status   Specimen Description URINE, SUPRAPUBIC  Final   Special Requests NONE  Final   Sputum evaluation   Final    THIS SPECIMEN IS ACCEPTABLE FOR SPUTUM CULTURE Performed at [facility], [address], [place], [place] [address]    Report Status 04/23/2024 FINAL  Final  Culture, Respiratory w Gram Stain     Status: None   Collection Time: 04/23/24 11:42 AM   Specimen: SPU  Result Value Ref Range Status   Specimen Description   Final    SPUTUM Performed at [facility], [address], [place], [place] [address]    Special Requests   Final    NONE Reflexed from [id number] Performed at [facility], [address] [place], [place] [address]    Gram Stain   Final    FEW WBC PRESENT, PREDOMINANTLY PMN MODERATE GRAM POSITIVE COCCI Performed at [facility], [address], [place], [place] [address]    Culture FEW STREPTOCOCCUS PNEUMONIAE  Final   Report Status 04/26/2024 FINAL  Final   Organism ID, Bacteria STREPTOCOCCUS PNEUMONIAE  Final      Susceptibility   Streptococcus pneumoniae - MIC*    ERYTHROMYCIN <=0.12 SENSITIVE Sensitive     LEVOFLOXACIN  1 SENSITIVE Sensitive     VANCOMYCIN 0.5  SENSITIVE Sensitive     PENICILLIN (meningitis) <=0.06 SENSITIVE Sensitive     PENO - penicillin <=0.06      PENICILLIN (non-meningitis) <=0.06 SENSITIVE Sensitive     PENICILLIN (oral) <=0.06 SENSITIVE Sensitive     CEFTRIAXONE  (non-meningitis) <=0.12 SENSITIVE Sensitive     CEFTRIAXONE  (meningitis) <=0.12 SENSITIVE Sensitive     * FEW STREPTOCOCCUS PNEUMONIAE     Radiology Studies: No results found.  Scheduled Meds:  acidophilus  2 capsule Oral TID   amiodarone   200 mg Oral BID   amLODipine   10 mg Oral Daily   budesonide  (PULMICORT ) nebulizer solution  0.5 mg Nebulization BID   cholestyramine  light  4 g Oral BID   gabapentin   300 mg Oral TID   levothyroxine   50 mcg Oral Q0600   lidocaine   1 patch Transdermal Q24H   liver oil-zinc  oxide   Topical BID   magic mouthwash w/lidocaine   5 mL Oral QID   pantoprazole   40 mg Oral QHS   pravastatin   80 mg Oral Daily   Continuous Infusions:     LOS: 9 days  MDM: Patient is high risk for one or more organ failure.  They necessitate ongoing hospitalization for continued IV therapies and subsequent lab monitoring. Total time spent interpreting labs and vitals, reviewing the medical record, coordinating care amongst consultants and care team members, directly assessing and discussing care with the patient and/or family: 55 min  Aaban Griep, DO [facility]  To contact the attending physician between 7A-7P please use Epic Chat. To contact the covering physician during after hours 7P-7A, please review Amion.  05/02/2024, 3:14 PM   *This document has been created with the assistance of dictation software. Please excuse typographical errors. *

## 2024-05-02 NOTE — Progress Notes (Signed)
 This patient's diarrhea has been controlled by Imodium .  The patient's colonoscopy showed diarrhea from an acute cause such as an infection versus a medication. The patient should be continued on Imodium  as an outpatient and trying to avoid constipation.  Nothing further to do from GI point of view at this time.  I will sign off.  Please call if any further GI concerns or questions.  We would like to thank you for the opportunity to participate in the care of Wendy [name].

## 2024-05-03 DIAGNOSIS — R6520 Severe sepsis without septic shock: Secondary | ICD-10-CM | POA: Diagnosis not present

## 2024-05-03 DIAGNOSIS — A419 Sepsis, unspecified organism: Secondary | ICD-10-CM | POA: Diagnosis not present

## 2024-05-03 LAB — GLUCOSE, CAPILLARY: Glucose-Capillary: 93 mg/dL (ref 70–99)

## 2024-05-03 MED ORDER — AMIODARONE HCL 200 MG PO TABS: 200.0000 mg | ORAL_TABLET | Freq: Every day | ORAL | Status: AC

## 2024-05-03 MED ORDER — MAGIC MOUTHWASH W/LIDOCAINE: 5.0000 mL | Freq: Four times a day (QID) | ORAL | Status: AC

## 2024-05-03 MED ORDER — RISAQUAD PO CAPS: 2.0000 | ORAL_CAPSULE | Freq: Three times a day (TID) | ORAL | Status: AC

## 2024-05-03 MED ORDER — FUROSEMIDE 40 MG PO TABS: 40.0000 mg | ORAL_TABLET | Freq: Every day | ORAL | Status: AC

## 2024-05-03 MED ORDER — AMLODIPINE BESYLATE 5 MG PO TABS: 5.0000 mg | ORAL_TABLET | Freq: Every day | ORAL | Status: AC

## 2024-05-03 NOTE — Plan of Care (Signed)
  Problem: Education: Goal: Knowledge of General Education information will improve Description: Including pain rating scale, medication(s)/side effects and non-pharmacologic comfort measures Outcome: Progressing   Problem: Health Behavior/Discharge Planning: Goal: Ability to manage health-related needs will improve Outcome: Progressing   Problem: Clinical Measurements: Goal: Ability to maintain clinical measurements within normal limits will improve Outcome: Progressing Goal: Will remain free from infection Outcome: Progressing Goal: Diagnostic test results will improve Outcome: Progressing Goal: Respiratory complications will improve Outcome: Progressing Goal: Cardiovascular complication will be avoided Outcome: Progressing   Problem: Activity: Goal: Risk for activity intolerance will decrease Outcome: Progressing   Problem: Nutrition: Goal: Adequate nutrition will be maintained Outcome: Progressing   Problem: Coping: Goal: Level of anxiety will decrease Outcome: Progressing   Problem: Elimination: Goal: Will not experience complications related to bowel motility Outcome: Progressing Goal: Will not experience complications related to urinary retention Outcome: Progressing   Problem: Pain Managment: Goal: General experience of comfort will improve and/or be controlled Outcome: Progressing   Problem: Safety: Goal: Ability to remain free from injury will improve Outcome: Progressing   Problem: Skin Integrity: Goal: Risk for impaired skin integrity will decrease Outcome: Progressing   Problem: Education: Goal: Knowledge of condition and prescribed therapy will improve Outcome: Progressing   Problem: Cardiac: Goal: Will achieve and/or maintain adequate cardiac output Outcome: Progressing   Problem: Physical Regulation: Goal: Complications related to the disease process, condition or treatment will be avoided or minimized Outcome: Progressing   Problem:  Activity: Goal: Ability to tolerate increased activity will improve Outcome: Progressing   Problem: Clinical Measurements: Goal: Ability to maintain a body temperature in the normal range will improve Outcome: Progressing   Problem: Respiratory: Goal: Ability to maintain adequate ventilation will improve Outcome: Progressing Goal: Ability to maintain a clear airway will improve Outcome: Progressing

## 2024-05-03 NOTE — Progress Notes (Signed)
 Physical Therapy Treatment Patient Details Name: Wendy [name] MRN: [id number] DOB: 1941/06/02 Today's Date: 05/03/2024   History of Present Illness 83 y.o. 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    PT Comments  Pt received up in recliner, ongoing dry non-productive cough. Remains on 3L O2 with SpO2 at 97% at rest. Pt demonstrated Supervision level for transfers, definite need for RW in standing due to weakness from prolonged hospital stay and current illness. Close CGA during gait training 25'x2, B LE's shaking, no LOB, SpO2 93% upon returning back to chair. Initial recs for STR at d/c remain appropriate.    If plan is discharge home, recommend the following: A little help with walking and/or transfers;A little help with bathing/dressing/bathroom;Assistance with cooking/housework;Assist for transportation;Help with stairs or ramp for entrance   Can travel by private vehicle     Yes  Equipment Recommendations  Rolling walker (2 wheels);BSC/3in1    Recommendations for Other Services       Precautions / Restrictions Precautions Precautions: Fall Recall of Precautions/Restrictions: Intact Restrictions Weight Bearing Restrictions Per Provider Order: No     Mobility  Bed Mobility               General bed mobility comments: not tested    Transfers Overall transfer level: Needs assistance Equipment used: Rolling walker (2 wheels) Transfers: Sit to/from Stand Sit to Stand: Supervision           General transfer comment:  (Pt able to stand from recliner and BSC over toilet with definite use of UE's to push off.)    Ambulation/Gait Ambulation/Gait assistance: Contact guard assist, Min assist Gait Distance (Feet):   (25) Assistive device: Rolling walker (2 wheels) Gait Pattern/deviations: Decreased step length - left, Decreased step length - right, Decreased stride length Gait velocity: decreased     General Gait Details:  (38ft x 2 on 3L O2 inorder tomaintain SpO2 above 92%)   Stairs             Wheelchair Mobility     Tilt Bed    Modified Rankin (Stroke Patients Only)       Balance Overall balance assessment: Needs assistance Sitting-balance support: No upper extremity supported, Feet supported Sitting balance-Leahy Scale: Good     Standing balance support: Bilateral upper extremity supported, During functional activity, Reliant on assistive device for balance Standing balance-Leahy Scale: Fair Standing balance comment: CGA static standing at sink for hand hygiene, uses BUE to correct posterior bias                            Communication Communication Communication: No apparent difficulties  Cognition Arousal: Alert Behavior During Therapy: WFL for tasks assessed/performed   PT - Cognitive impairments: No apparent impairments                       PT - Cognition Comments:  (Very pleasant and cooperative) Following commands: Intact      Cueing Cueing Techniques: Verbal cues, Gestural cues  Exercises Other Exercises Other Exercises:  (Pt educated on role of acute PT, PLB technique and pacing for energy conservation)    General Comments General comments (skin integrity, edema, etc.):  (Continued dry non-productive cough)      Pertinent  Vitals/Pain Pain Assessment Pain Assessment: No/denies pain    Home Living                          Prior Function            PT Goals (current goals can now be found in the care plan section) Acute Rehab PT Goals Patient Stated Goal: to return home, get better Progress towards PT goals: Progressing toward goals    Frequency    Min 2X/week      PT Plan      Co-evaluation               AM-PAC PT 6 Clicks Mobility   Outcome Measure  Help needed turning from your back to your side while in a flat bed without using bedrails?: A Little Help needed moving from lying on your back to sitting on the side of a flat bed without using bedrails?: A Little Help needed moving to and from a bed to a chair (including a wheelchair)?: A Little Help needed standing up from a chair using your arms (e.g., wheelchair or bedside chair)?: A Little Help needed to walk in hospital room?: A Little Help needed climbing 3-5 steps with a railing? : A Lot 6 Click Score: 17    End of Session Equipment Utilized During Treatment: Oxygen ;Gait belt Activity Tolerance: Patient tolerated treatment well;Patient limited by fatigue Patient left: in chair;with call bell/phone within reach Nurse Communication: Mobility status PT Visit Diagnosis: Difficulty in walking, not elsewhere classified (R26.2);Other abnormalities of gait and mobility (R26.89);Unsteadiness on feet (R26.81);Muscle weakness (generalized) (M62.81)     Time: 8783-8759 PT Time Calculation (min) (ACUTE ONLY): 24 min  Charges:    $Gait Training: 8-22 mins $Therapeutic Activity: 8-22 mins PT General Charges $$ ACUTE PT VISIT: 1 Visit                    Darice Bohr, PTA  Darice JAYSON Bohr 05/03/2024, 1:17 PM

## 2024-05-03 NOTE — Progress Notes (Signed)
 [facility]  PROCEDURAL EXPEDITER PROGRESS NOTE  Patient Name: Wendy [name]  DOB:[birth year] Date of Admission: 04/20/2024  Date of Assessment:05/03/24   -------------------------------------------------------------pt needs to go to SNF------------------------------------------------------------------------------------------------------------------  [facility] Expediter, Ronal DELENA Bald Please contact us  directly via secure chat (search for [facility]) or by calling us  at [phone] [place]).

## 2024-05-03 NOTE — TOC Transition Note (Signed)
 Transition of Care [facility]) - Discharge Note   Patient Details  Name: Wendy [name] MRN: [id number] Date of Birth: [birth year]  Transition of Care [facility]) CM/SW Contact:  Shasta DELENA Daring, RN Phone Number: 05/03/2024, 2:30 PM   Clinical Narrative:    Authorization received for patient placement at [facility]. Transportation arranged via [facility]. Patient is 9th on list for transport. Bedside RN provided with number to call report.  [place] to be discharged on 2L O2. [facility] and [facility] aware. No additional TOC needs identified. RNCM signing off.     Barriers to Discharge: Barriers Resolved   Patient Goals and CMS Choice            Discharge Placement              Patient chooses bed at:  [facility]) Patient to be transferred to facility by: [facility] Name of family member notified: Patient Patient and family notified of of transfer: 05/03/24  Discharge Plan and Services Additional resources added to the After Visit Summary for                                       Social Drivers of Health (SDOH) Interventions SDOH Screenings   Food Insecurity: No Food Insecurity (04/21/2024)  Housing: Low Risk  (04/21/2024)  Transportation Needs: No Transportation Needs (04/21/2024)  Utilities: Not At Risk (04/21/2024)  Financial Resource Strain: Low Risk  (07/28/2023)   Received from [facility]  Social Connections: Socially Isolated (04/21/2024)  Tobacco Use: Low Risk  (04/22/2024)     Readmission Risk Interventions     No data to display

## 2024-05-03 NOTE — Discharge Summary (Signed)
 DISCHARGE SUMMARY    Wendy [name] FMW:[id number] DOB: 1940/11/03 DOA: 04/20/2024  PCP: [facility]  Admit date: 04/20/2024 Discharge date: 05/03/2024   Recommendations for Outpatient Follow-up:  Follow up with PCP in 1-2 for chronic condition management.  Continue to wean off oxygen  as able   Hospital Course: 83 y.o. 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  She denies abdominal pain or vomiting she was hypotensive to the 60s with EMS and was placed on Levophed which was discontinued soon after arrival in the ED. On arrival in the ED, BP 83/57 with otherwise normal vitals Labs notable for normal WBC, hemoglobin 11.9 (baseline 12).  CMP notable for potassium of 2.5 with creatinine of 2.02 (baseline 1.5) Lipase and LFTs WNL and magnesium normal Stool culture ordered from the ED Patient treated with IV potassium  11/20.  Patient having diarrhea for 3 weeks.  Stool studies sent off and negative.  EGD negative.  Prepping for colonoscopy and this evening will have a colonoscopy tomorrow 11/21. Colonoscopy negative but biosies taken 11/22.  Patient diagnosed with pneumonia overnight.  Starting rocephin  and zithromax .  Patient went into rapid afib this am.  Coughing up blood.  Placed on oxygen .  11/22 continued.  Needed to switch over to amiodarone  drip to control heart rate with blood pressure being on the lower side.  Will hold further Lasix  and give a fluid bolus.  Consulted pulmonary for coughing up blood.  With starting amiodarone  will have to change Zithromax  over to doxycycline .  With possible aspiration will change Rocephin  over to Zosyn .  Steroids  started by pulmonary.  11/23.  Patient complained of chest pain this morning.  Given a fluid bolus for low blood pressure.  Troponins trended down from yesterday.  Patient still having some diarrhea and coughing up blood.  This afternoon down to 5 L nasal cannula. 11/24.  Patient still complaining of coughing up blood.  Still having some rib pains.  Still having some diarrhea. 11/25.  Patient still coughing up a little blood still having some diarrhea.  Colon biopsies did not show any pathology.  Supportive care with cholestyramine  and as needed Imodium .  Can consider Xifaxan.  Switch antibiotics for pneumonia to Rocephin  only since Streptococcus pneumonia growing out of sputum culture.  Physical therapy recommending rehab. 11/26.  Still having diarrhea.  Schedule Imodium  11/27 much improvement in diarrhea.  Patient still very weak.  She is hesitant to discharge home today.  She is refusing SNF and home health 11/28 diarrhea improved.  Attempted to wean patient to room air but desatting into the upper 80s.  She is now amendable to SNF.  Will start this process 11/29-30 no acute events overnight pending SNF placement 12/1: Patient complaining of constipation.  Would like to stop Imodium .  Reports her sore throat is improving with nystatin mouthwash  Diarrhea, resolved - Has had extensive workup. - Stool studies negative - Colonoscopy negative, biopsies unremarkable findings consistent with acute diarrhea such as infection versus medication side effect - Continue probiotics - Continue with cholestyramine  - Stop Imodium  today due to report of constipation.  May need to resume it in the future - Allopurinol  discontinued in case this was contributing.  Watch for gout flares. -  Patient does have history of colon resection, extensive history of IBS, and is on daily antibiotics outpatient for recurrent UTI.  All of the above contributing to her chronic diarrhea.    Sore throat - Some scattered white  plaques seen at back of throat.  Difficult to view total pharynx given Mallampati class IV - Patient reports some improvement with nystatin mouthwash.  Continue with Magic mouthwash x 5 days - Continue lozenges and tea and honey   Generalized deconditioning - Secondary to all of the below - PT/OT recommending SNF.  Initially she refused and stay was prolonged, as she reported not feeling ready for discharge to home health.  She then became amendable to SNF.  TOC consulted and we are awaiting updates.   Severe sepsis (HCC) Strep pneumo pneumonia -Left upper lobe pneumonia with tachycardia, tachypnea, leukocytosis, lactic acidosis. - Strep Pneumo ag and sputum positive - Has completed course of ceftriaxone  - Blood cultures negative   Hemoptysis, resolved - Home analogy was consulted.  Suspected secondary to pneumonia.  Has resolved now.   Acute respiratory failure with hypoxia (HCC) -O2 sat 88% on room air, was on nonrebreather when diagnosed with pneumonia eventually requiring high flow nasal cannula -Required about 2 L, difficulty weaning to room air.  - Anticipate this is due to significant deconditioning now.  Encourage incentive spirometry and flutter valve -Status post 5-day prednisone course.   Rapid atrial fibrillation (HCC) -Now NSR - Amiodarone  drip started 11/22, switched to oral 11/23 - Continue amiodarone  taper.  200 mg twice daily starting 11/28.  Will transition to 200 daily now in light of hypotension - No blood thinners secondary to hemoptysis    Hypotension intermittent. - Hypotensive again today.  Transition amiodarone  to once daily dosing   Acute renal failure superimposed on stage 3b chronic kidney disease (HCC) -Prerenal secondary to GI losses resulting in hypoperfusion with ATN - Metabolic acidosis resolved - Creatinine on presentation 2.0, has resolved to 1.2 for now - Continue to encourage p.o. intake.  Avoid additional IV fluids given CHF.   Acute on  chronic diastolic CHF (congestive heart failure) (HCC) -Continue holding Lasix    Hypokalemia -Replace needed   Increased anion gap metabolic acidosis Secondary to AKI on CKD.  Improved now.   Recurrent syncope -Likely secondary to dehydration, hypotension and hypokalemia   Melena -EGD negative.     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  Has been on amlodipine  and amiodarone .  Decreasing both   Hypothyroidism On levothyroxine    Obstructive sleep apnea CPAP nightly if desired   Body mass index is 35.78 kg/m. Obesity Class 2 - Outpatient follow up for lifestyle modification and risk factor management     Discharge Instructions  Discharge Instructions     Call MD for:  difficulty breathing, headache or visual disturbances   Complete by: As directed    Call MD for:  persistant dizziness or light-headedness   Complete by: As directed    Call MD for:  persistant nausea and vomiting   Complete by: As directed    Call MD for:  severe uncontrolled pain   Complete by: As directed    Call MD  for:  temperature >100.4   Complete by: As directed    Diet general   Complete by: As directed    Discharge instructions   Complete by: As directed    Follow up with your primary care physician to discuss the medication changes during this admission   Increase activity slowly   Complete by: As directed       Allergies as of 05/03/2024       Reactions   Ciprofloxacin Other (See Comments)   Other reaction(s): Other (See Comments) Leg cramps Leg cramps   Codeine    REACTION: dizziness and nausea    Cyclobenzaprine    Other reaction(s): Dizziness   Imdur [isosorbide Dinitrate]    Latex Itching   Lisinopril    Macrobid [nitrofurantoin Macrocrystal]    Nitrofurantoin    Oxycodone Hcl    REACTION: nausea and vomiting   Prednisone    Other reaction(s): Unknown At high doses   Sulfamethoxazole-trimethoprim    Other reaction(s): Other (See Comments) Leg cramps   Amoxicillin-pot Clavulanate Rash   cramps   Tramadol Nausea Only   Other reaction(s): Headache, Other (See Comments)        Medication List     STOP taking these medications    allopurinol  100 MG tablet Commonly known as: ZYLOPRIM    cephALEXin 250 MG capsule Commonly known as: KEFLEX   Cholecalciferol 50 MCG (2000 UT) Tabs   hydrochlorothiazide 25 MG tablet Commonly known as: HYDRODIURIL   MAGNESIUM PO   nystatin powder Commonly known as: MYCOSTATIN/NYSTOP   torsemide 20 MG tablet Commonly known as: DEMADEX   traMADol 50 MG tablet Commonly known as: ULTRAM   trimethoprim 100 MG tablet Commonly known as: TRIMPEX       TAKE these medications    acidophilus Caps capsule Take 2 capsules by mouth 3 (three) times daily.   amiodarone  200 MG tablet Commonly known as: PACERONE  Take 1 tablet (200 mg total) by mouth daily. Start taking on: May 04, 2024   amLODipine  5 MG tablet Commonly known as: NORVASC  Take 1 tablet (5 mg total) by mouth daily. Start taking on: May 04, 2024   aspirin EC 81 MG tablet Take 81 mg by mouth daily.   co-enzyme Q-10 30 MG capsule Take 30 mg by mouth daily.   cyanocobalamin  1000 MCG/ML injection Commonly known as: VITAMIN B12 Inject 1,000 mcg into the muscle every 30 (thirty) days.   dicyclomine 10 MG capsule Commonly known as: BENTYL Take 10 mg by mouth at bedtime as needed.   ezetimibe 10 MG tablet Commonly known as: ZETIA Take 10 mg by mouth daily.   ferrous sulfate  325 (65 FE) MG EC tablet Take 325 mg by mouth daily with breakfast.    furosemide  40 MG tablet Commonly known as: LASIX  Take 1 tablet (40 mg total) by mouth daily. Take as needed for swelling and edema What changed: additional instructions   gabapentin  300 MG capsule Commonly known as: NEURONTIN  Take 300 mg by mouth 3 (three) times daily.   levothyroxine  50 MCG tablet Commonly known as: SYNTHROID  Take 50 mcg by mouth daily.   magic mouthwash w/lidocaine  Soln Take 5 mLs by mouth 4 (four) times daily for 5 days. Suspension contains equal amounts of Maalox Extra Strength, nystatin, diphenhydramine and lidocaine .   nitroGLYCERIN  0.4 MG SL tablet Commonly known as: NITROSTAT  Place 0.4 mg under the tongue every 5 (five) minutes as needed for chest pain.   NON FORMULARY oxygen    omeprazole 20 MG  capsule Commonly known as: PRILOSEC Take 20 mg by mouth 2 (two) times daily before a meal.   pravastatin  80 MG tablet Commonly known as: PRAVACHOL  Take 80 mg by mouth daily.   telmisartan 40 MG tablet Commonly known as: MICARDIS Take 40 mg by mouth daily.               Durable Medical Equipment  (From admission, onward)           Start     Ordered   04/29/24 1043  For home use only DME oxygen   Once       Question Answer Comment  Length of Need 12 Months   Mode or (Route) Nasal cannula   Liters per Minute 2   Frequency Continuous (stationary and portable oxygen  unit needed)   Oxygen  delivery system: Gas   Oxygen  delivery system: Portable concentrator (POC)      04/29/24 1042            Allergies  Allergen Reactions   Ciprofloxacin Other (See Comments)    Other reaction(s): Other (See Comments) Leg cramps Leg cramps    Codeine     REACTION: dizziness and nausea   Cyclobenzaprine     Other reaction(s): Dizziness   Imdur [Isosorbide Dinitrate]    Latex Itching   Lisinopril    Macrobid [Nitrofurantoin Macrocrystal]    Nitrofurantoin    Oxycodone Hcl     REACTION: nausea and vomiting   Prednisone     Other reaction(s):  Unknown At high doses    Sulfamethoxazole-Trimethoprim     Other reaction(s): Other (See Comments) Leg cramps    Amoxicillin-Pot Clavulanate Rash    cramps    Tramadol Nausea Only    Other reaction(s): Headache, Other (See Comments)    Consultations: Treatment Team:  Malka Domino, MD   Procedures/Studies: CT Angio Chest Pulmonary Embolism (PE) W or WO Contrast Result Date: 04/23/2024 EXAM: CTA CHEST AORTA 04/23/2024 04:25:06 AM TECHNIQUE: CTA of the chest was performed after the administration of intravenous contrast. Multiplanar reformatted images are provided for review. MIP images are provided for review. Automated exposure control, iterative reconstruction, and/or weight based adjustment of the mA/kV was utilized to reduce the radiation dose to as low as reasonably achievable. COMPARISON: Portable chest x-ray 04/22/2024. Chest CT 03/26/2010. CLINICAL HISTORY: 83 year old female. Pulmonary embolism (PE) suspected, low to intermediate probability, negative D-dimer. FINDINGS: AORTA: Calcified atherosclerosis of the thoracic aorta is extensive. No thoracic aortic dissection. No aneurysm. MEDIASTINUM: The heart size remains normal. Left chest cardiac pacemaker. No pericardial effusion. LYMPH NODES: Reactive appearing mediastinal lymph nodes are increased since 2011 but mostly remain subcentimeter. No hilar or axillary lymphadenopathy. LUNGS AND PLEURA: There is maintained pulmonary artery enhancement within the abnormal left lung. No pulmonary artery filling defect. Mild central pulmonary artery enlargement. Large area of consolidation in the left upper lobe and continuing into the lingula. Superimposed additional lateral basal segment left lower lobe consolidation. Peribronchograms throughout these segments. The affected left lower lobe parenchyma is enhancing to a greater degree suggesting a component of atelectasis there. Small superimposed pleural effusion which is primarily limited  to the posterior costophrenic angle. The mainstem bronchi remain patent. There is narrowing of the left lower lobe bronchus which appears in part related to regional mass effect. Underlying bilateral subpleural lung scarring which was present in 2011, anterior upper lobe predominance, and may be related to previous chest radiology. Furthermore, there is early consolidation in the right middle lobe near the cardiophrenic angle.  No other acute right lung opacity. UPPER ABDOMEN: Small layering gallstones. Nodular liver contour raising the possibility of cirrhosis (series 7 image 148). Spleen size remains normal. No ascites in the visible upper abdomen. Mildly thickened adrenal glands, such as from adrenal hyperplasia. Chronically atrophied left kidney. Chronic nephrolithiasis. Left renal upper pole exophytic cyst with simple fluid attenuation appears benign (no follow up imaging recommended). Chronic surgical clips in the greater omentum in the left upper quadrant. SOFT TISSUES AND BONES: Right shoulder arthroplasty with streak artifact. Osteopenia and degeneration in the spine. No acute or suspicious osseous lesion. Large lobar left upper lobe pneumonia. Less pronounced bilateral middle lobe pneumonia. Left lower lobe atelectasis. Trace left pleural effusion. Underlying huge hiatal hernia with intrathoracic hernia. Associated mass effect and architectural distortion in the left lower lobe chest. IMPRESSION: 1. Large lobar left upper lobe pneumonia. Less pronounced bilateral middle lobe pneumonias, and left lower lobe atelectasis vs pneumonia. Trace left pleural effusion. 2. Underlying huge hiatal hernia with intrathoracic stomach - with associated mass effect and architectural distortion in the left lower lobe chest. 3. And Underlying chronic lung disease, perhaps in part post-radiation scarring. 4. No pulmonary embolism identified. 5. Nodular liver contour suspicious for Cirrhosis. Small gallstones. Advanced coronary  artery and aortic calcified atherosclerosis. Electronically signed by: Helayne Hurst MD 04/23/2024 04:39 AM EST RP Workstation: [id number]   DG Chest Port 1 View Result Date: 04/22/2024 EXAM: 1 VIEW(S) XRAY OF THE CHEST 04/22/2024 11:14:05 PM COMPARISON: 10/23/2022 CLINICAL HISTORY: Shortness of breath FINDINGS: LUNGS AND PLEURA: Left lung airspace opacity abutting the left lateral chest wall measuring up to 9.8 x 4.4 cm. Although this may represent acute infiltrate, a more aggressive process could not be totally excluded. CT of the chest with contrast is recommended for further evaluation. HEART AND MEDIASTINUM: Aortic atherosclerosis. Left cardiac pacemaker in place. BONES AND SOFT TISSUES: Right shoulder arthroplasty. No acute osseous abnormality. IMPRESSION: 1. Left lung airspace opacity abutting the left lateral chest wall measuring up to 9.8 x 4.4 cm, which may represent acute infiltrate; however, a more aggressive process cannot be excluded. Recommend chest CT with contrast for further evaluation. Electronically signed by: Oneil Devonshire MD 04/22/2024 11:27 PM EST RP Workstation: [id number]   ECHOCARDIOGRAM COMPLETE Result Date: 04/22/2024    ECHOCARDIOGRAM REPORT   Patient Name:   Siennah FORBES [place]PO[place] Date of Exam: 04/22/2024 Medical Rec #:  [id number]        Height:       66.0 in Accession #:    [id number]       Weight:       229.5 lb Date of Birth:  06-15-1940         BSA:          2.120 m Patient Age:    83 years         BP:           169/67 mmHg Patient Gender: F                HR:           96 bpm. Exam Location:  [facility] Procedure: 2D Echo, Cardiac Doppler, Color Doppler, 3D Echo and Strain Analysis            (Both Spectral and Color Flow Doppler were utilized during            procedure). Indications:     Syncope R55  History:         Patient has no prior  history of Echocardiogram examinations.                  COPD; Risk Factors:Sleep Apnea.  Sonographer:     Christopher Furnace Referring Phys:  [id number] DELAYNE LULLA SOLIAN Diagnosing Phys: Denyse Bathe  Sonographer Comments: Global longitudinal strain was attempted. IMPRESSIONS  1. Left ventricular ejection fraction, by estimation, is 60 to 65%. The left ventricle has normal function. The left ventricle has no regional wall motion abnormalities. There is moderate concentric left ventricular hypertrophy. Left ventricular diastolic parameters are consistent with Grade I diastolic dysfunction (impaired relaxation). The global longitudinal strain is normal.  2. Right ventricular systolic function is normal. The right ventricular size is normal.  3. Left atrial size was mildly dilated.  4. Right atrial size was mildly dilated.  5. The mitral valve is normal in structure. Trivial mitral valve regurgitation. No evidence of mitral stenosis.  6. The aortic valve is normal in structure. Aortic valve regurgitation is trivial. Aortic valve sclerosis is present, with no evidence of aortic valve stenosis.  7. The inferior vena cava is normal in size with greater than 50% respiratory variability, suggesting right atrial pressure of 3 mmHg. FINDINGS  Left Ventricle: Left ventricular ejection fraction, by estimation, is 60 to 65%. The left ventricle has normal function. The left ventricle has no regional wall motion abnormalities. Strain was performed and the global longitudinal strain is normal. The  left ventricular internal cavity size was normal in size. There is moderate concentric left ventricular hypertrophy. Left ventricular diastolic parameters are consistent with Grade I diastolic dysfunction (impaired relaxation). Right Ventricle: The right ventricular size is normal. No increase in right ventricular wall thickness. Right ventricular systolic function is normal. Left Atrium: Left atrial size was mildly dilated. Right Atrium: Right atrial size was mildly dilated. Pericardium: There is no evidence of pericardial effusion. Mitral Valve: The mitral valve is normal in structure. Trivial  mitral valve regurgitation. No evidence of mitral valve stenosis. MV peak gradient, 10.1 mmHg. The mean mitral valve gradient is 6.0 mmHg. Tricuspid Valve: The tricuspid valve is normal in structure. Tricuspid valve regurgitation is not demonstrated. No evidence of tricuspid stenosis. Aortic Valve: The aortic valve is normal in structure. Aortic valve regurgitation is trivial. Aortic valve sclerosis is present, with no evidence of aortic valve stenosis. Aortic valve mean gradient measures 6.0 mmHg. Aortic valve peak gradient measures 11.3 mmHg. Aortic valve area, by VTI measures 3.42 cm. Pulmonic Valve: The pulmonic valve was normal in structure. Pulmonic valve regurgitation is trivial. No evidence of pulmonic stenosis. Aorta: The aortic root is normal in size and structure. Venous: The inferior vena cava is normal in size with greater than 50% respiratory variability, suggesting right atrial pressure of 3 mmHg. IAS/Shunts: No atrial level shunt detected by color flow Doppler. Additional Comments: 3D was performed not requiring image post processing on an independent workstation and was normal.  LEFT VENTRICLE PLAX 2D LVIDd:         3.50 cm   Diastology LVIDs:         2.50 cm   LV e' medial:    9.46 cm/s LV PW:         0.80 cm   LV E/e' medial:  11.1 LV IVS:        1.00 cm   LV e' lateral:   10.90 cm/s LVOT diam:     2.00 cm   LV E/e' lateral: 9.6 LV SV:  96 LV SV Index:   45 LVOT Area:     3.14 cm LV IVRT:       63 msec  RIGHT VENTRICLE RV Basal diam:  3.10 cm     PULMONARY VEINS RV Mid diam:    2.20 cm     Diastolic Velocity: 91.50 cm/s RV S prime:     15.20 cm/s  S/D Velocity:       0.50 TAPSE (M-mode): 1.6 cm      Systolic Velocity:  44.40 cm/s LEFT ATRIUM             Index        RIGHT ATRIUM           Index LA diam:        2.80 cm 1.32 cm/m   RA Area:     10.30 cm LA Vol (A2C):   62.1 ml 29.29 ml/m  RA Volume:   20.50 ml  9.67 ml/m LA Vol (A4C):   39.5 ml 18.63 ml/m LA Biplane Vol: 49.2 ml 23.21  ml/m  AORTIC VALVE AV Area (Vmax):    3.27 cm AV Area (Vmean):   3.14 cm AV Area (VTI):     3.42 cm AV Vmax:           168.00 cm/s AV Vmean:          117.000 cm/s AV VTI:            0.281 m AV Peak Grad:      11.3 mmHg AV Mean Grad:      6.0 mmHg LVOT Vmax:         175.00 cm/s LVOT Vmean:        117.000 cm/s LVOT VTI:          0.306 m LVOT/AV VTI ratio: 1.09  AORTA Ao Root diam: 3.30 cm MITRAL VALVE                TRICUSPID VALVE MV Area (PHT): 3.68 cm     TR Peak grad:   13.5 mmHg MV Area VTI:   2.71 cm     TR Vmax:        184.00 cm/s MV Peak grad:  10.1 mmHg MV Mean grad:  6.0 mmHg     SHUNTS MV Vmax:       1.59 m/s     Systemic VTI:  0.31 m MV Vmean:      116.0 cm/s   Systemic Diam: 2.00 cm MV Decel Time: 206 msec MV E velocity: 105.00 cm/s MV A velocity: 130.00 cm/s MV E/A ratio:  0.81 Shaukat Khan Electronically signed by Denyse Bathe Signature Date/Time: 04/22/2024/4:10:06 PM    Final       Discharge Exam: Vitals:   05/03/24 1000 05/03/24 1259  BP: 95/77 (!) 102/48  Pulse:  78  Resp:  16  Temp:  98.2 F (36.8 C)  SpO2:  97%   Vitals:   05/03/24 0500 05/03/24 0827 05/03/24 1000 05/03/24 1259  BP:  (!) 119/53 95/77 (!) 102/48  Pulse:  72  78  Resp:  16  16  Temp:  98 F (36.7 C)  98.2 F (36.8 C)  TempSrc:      SpO2:  96%  97%  Weight: 100.7 kg     Height:        Constitutional:  Normal appearance. Non toxic-appearing.  HENT: Head Normocephalic and atraumatic.  Mucous membranes are moist.  Eyes:  Extraocular intact. Conjunctivae normal.  Cardiovascular:  Rate and Rhythm: Normal rate and regular rhythm.  Pulmonary: Non labored, symmetric rise of chest wall. On 2L Skin: warm and dry. not jaundiced.  Neurological: No focal deficit present. alert. Oriented.  Psychiatric: Mood and Affect congruent.    The results of significant diagnostics from this hospitalization (including imaging, microbiology, ancillary and laboratory) are listed below for reference.      Microbiology: No results found for this or any previous visit (from the past 240 hours).   Labs: BNP (last 3 results) No results for input(s): BNP in the last 8760 hours. Basic Metabolic Panel: Recent Labs  Lab 04/27/24 0917  NA 142  K 5.0  CL 110  CO2 20*  GLUCOSE 143*  BUN 35*  CREATININE 1.24*  CALCIUM 9.2   Liver Function Tests: Recent Labs  Lab 04/27/24 0917  AST 35  ALT 53*  ALKPHOS 132*  BILITOT 0.4  PROT 6.6  ALBUMIN 3.3*   No results for input(s): LIPASE, AMYLASE in the last 168 hours. No results for input(s): AMMONIA in the last 168 hours. CBC: Recent Labs  Lab 04/27/24 0917  WBC 11.7*  NEUTROABS 9.3*  HGB 12.7  HCT 39.6  MCV 99.2  PLT 321   Cardiac Enzymes: No results for input(s): CKTOTAL, CKMB, CKMBINDEX, TROPONINI in the last 168 hours. BNP: Invalid input(s): POCBNP CBG: Recent Labs  Lab 04/30/24 2125 05/01/24 0005 05/01/24 0417 05/02/24 0450 05/03/24 0510  GLUCAP 112* 128* 77 93 93   D-Dimer No results for input(s): DDIMER in the last 72 hours. Hgb A1c No results for input(s): HGBA1C in the last 72 hours. Lipid Profile No results for input(s): CHOL, HDL, LDLCALC, TRIG, CHOLHDL, LDLDIRECT in the last 72 hours. Thyroid  function studies No results for input(s): TSH, T4TOTAL, T3FREE, THYROIDAB in the last 72 hours.  Invalid input(s): FREET3 Anemia work up No results for input(s): VITAMINB12, FOLATE, FERRITIN, TIBC, IRON, RETICCTPCT in the last 72 hours. Urinalysis    Component Value Date/Time   COLORURINE Straw 04/29/2013 0020   COLORURINE YELLOW 03/11/2010 1335   APPEARANCEUR Clear 04/29/2013 0020   LABSPEC 1.014 04/29/2013 0020   PHURINE 6.0 04/29/2013 0020   PHURINE 6.0 03/11/2010 1335   GLUCOSEU Negative 04/29/2013 0020   GLUCOSEU NEG mg/dL 89/89/7988 8664   HGBUR Negative 04/29/2013 0020   HGBUR NEGATIVE 10/19/2007 1548   BILIRUBINUR Negative 04/29/2013 0020    KETONESUR Negative 04/29/2013 0020   KETONESUR NEG mg/dL 89/89/7988 8664   PROTEINUR Negative 04/29/2013 0020   PROTEINUR NEG mg/dL 89/89/7988 8664   UROBILINOGEN 0.2 03/11/2010 1335   NITRITE Negative 04/29/2013 0020   NITRITE NEG 03/11/2010 1335   LEUKOCYTESUR 1+ 04/29/2013 0020   Sepsis Labs Recent Labs  Lab 04/27/24 0917  WBC 11.7*   Microbiology No results found for this or any previous visit (from the past 240 hours).   Time coordinating discharge: 32 min   SIGNED: Tecla Mailloux, DO [facility] 05/03/2024, 1:24 PM Pager   If 7PM-7AM, please contact night-coverage

## 2024-05-03 NOTE — Plan of Care (Signed)
  Problem: Clinical Measurements: Goal: Ability to maintain clinical measurements within normal limits will improve Outcome: Completed/Met
